# Patient Record
Sex: Female | Born: 1955 | Race: White | Hispanic: No | Smoking: Current some day smoker
Health system: Southern US, Community
[De-identification: ages and names within clinical notes are randomized; demographics above are authoritative.]

## PROBLEM LIST (undated history)

## (undated) DIAGNOSIS — Z974 Presence of external hearing-aid: Secondary | ICD-10-CM

## (undated) DIAGNOSIS — J3089 Other allergic rhinitis: Secondary | ICD-10-CM

## (undated) DIAGNOSIS — G4733 Obstructive sleep apnea (adult) (pediatric): Secondary | ICD-10-CM

## (undated) DIAGNOSIS — F419 Anxiety disorder, unspecified: Secondary | ICD-10-CM

## (undated) DIAGNOSIS — Z973 Presence of spectacles and contact lenses: Secondary | ICD-10-CM

## (undated) DIAGNOSIS — E785 Hyperlipidemia, unspecified: Secondary | ICD-10-CM

## (undated) DIAGNOSIS — M1990 Unspecified osteoarthritis, unspecified site: Secondary | ICD-10-CM

## (undated) DIAGNOSIS — H409 Unspecified glaucoma: Secondary | ICD-10-CM

## (undated) DIAGNOSIS — H9190 Unspecified hearing loss, unspecified ear: Secondary | ICD-10-CM

## (undated) DIAGNOSIS — G3184 Mild cognitive impairment, so stated: Principal | ICD-10-CM

## (undated) DIAGNOSIS — R29898 Other symptoms and signs involving the musculoskeletal system: Secondary | ICD-10-CM

## (undated) DIAGNOSIS — F329 Major depressive disorder, single episode, unspecified: Secondary | ICD-10-CM

## (undated) HISTORY — PX: TOTAL KNEE ARTHROPLASTY: SHX125

## (undated) HISTORY — PX: ABDOMINAL HYSTERECTOMY: SHX81

## (undated) HISTORY — DX: Mild cognitive impairment, so stated: G31.84

## (undated) HISTORY — PX: LAPAROSCOPIC VAGINAL HYSTERECTOMY WITH SALPINGO OOPHORECTOMY: SHX6681

## (undated) HISTORY — DX: Unspecified hearing loss, unspecified ear: H91.90

## (undated) HISTORY — PX: FRACTURE SURGERY: SHX138

## (undated) HISTORY — DX: Anxiety disorder, unspecified: F41.9

## (undated) HISTORY — PX: KNEE ARTHROSCOPY W/ SYNOVECTOMY: SHX1887

## (undated) HISTORY — PX: ABDOMINAL HERNIA REPAIR: SHX539

## (undated) HISTORY — PX: JOINT REPLACEMENT: SHX530

## (undated) HISTORY — PX: OTHER SURGICAL HISTORY: SHX169

---

## 1997-08-07 ENCOUNTER — Encounter (INDEPENDENT_AMBULATORY_CARE_PROVIDER_SITE_OTHER): Payer: Self-pay | Admitting: *Deleted

## 1997-08-07 LAB — CONVERTED CEMR LAB

## 1999-08-17 ENCOUNTER — Other Ambulatory Visit: Admission: RE | Admit: 1999-08-17 | Discharge: 1999-08-17 | Payer: Self-pay | Admitting: Family Medicine

## 2000-08-27 ENCOUNTER — Emergency Department (HOSPITAL_COMMUNITY): Admission: EM | Admit: 2000-08-27 | Discharge: 2000-08-27 | Payer: Self-pay | Admitting: Emergency Medicine

## 2001-01-30 ENCOUNTER — Emergency Department (HOSPITAL_COMMUNITY): Admission: EM | Admit: 2001-01-30 | Discharge: 2001-01-30 | Payer: Self-pay | Admitting: Emergency Medicine

## 2002-07-08 ENCOUNTER — Encounter: Admission: RE | Admit: 2002-07-08 | Discharge: 2002-07-08 | Payer: Self-pay | Admitting: Family Medicine

## 2002-07-08 ENCOUNTER — Other Ambulatory Visit: Admission: RE | Admit: 2002-07-08 | Discharge: 2002-07-08 | Payer: Self-pay | Admitting: *Deleted

## 2002-07-08 ENCOUNTER — Encounter (INDEPENDENT_AMBULATORY_CARE_PROVIDER_SITE_OTHER): Payer: Self-pay | Admitting: *Deleted

## 2002-07-23 ENCOUNTER — Encounter: Payer: Self-pay | Admitting: Sports Medicine

## 2002-07-23 ENCOUNTER — Encounter: Admission: RE | Admit: 2002-07-23 | Discharge: 2002-07-23 | Payer: Self-pay | Admitting: Sports Medicine

## 2002-07-25 ENCOUNTER — Encounter: Admission: RE | Admit: 2002-07-25 | Discharge: 2002-07-25 | Payer: Self-pay | Admitting: Family Medicine

## 2002-08-26 ENCOUNTER — Encounter: Admission: RE | Admit: 2002-08-26 | Discharge: 2002-08-26 | Payer: Self-pay | Admitting: Family Medicine

## 2005-09-25 ENCOUNTER — Observation Stay (HOSPITAL_COMMUNITY): Admission: AD | Admit: 2005-09-25 | Discharge: 2005-09-26 | Payer: Self-pay | Admitting: Obstetrics and Gynecology

## 2005-09-25 ENCOUNTER — Encounter (INDEPENDENT_AMBULATORY_CARE_PROVIDER_SITE_OTHER): Payer: Self-pay | Admitting: *Deleted

## 2005-10-24 ENCOUNTER — Encounter: Admission: RE | Admit: 2005-10-24 | Discharge: 2006-01-22 | Payer: Self-pay | Admitting: Cardiology

## 2006-06-11 ENCOUNTER — Inpatient Hospital Stay (HOSPITAL_COMMUNITY): Admission: RE | Admit: 2006-06-11 | Discharge: 2006-06-14 | Payer: Self-pay | Admitting: Specialist

## 2006-06-12 ENCOUNTER — Ambulatory Visit: Payer: Self-pay | Admitting: Physical Medicine & Rehabilitation

## 2006-06-14 ENCOUNTER — Inpatient Hospital Stay (HOSPITAL_COMMUNITY)
Admission: RE | Admit: 2006-06-14 | Discharge: 2006-06-21 | Payer: Self-pay | Admitting: Physical Medicine & Rehabilitation

## 2006-06-19 ENCOUNTER — Encounter: Payer: Self-pay | Admitting: Vascular Surgery

## 2006-10-04 DIAGNOSIS — IMO0002 Reserved for concepts with insufficient information to code with codable children: Secondary | ICD-10-CM | POA: Insufficient documentation

## 2006-10-04 DIAGNOSIS — F411 Generalized anxiety disorder: Secondary | ICD-10-CM | POA: Insufficient documentation

## 2006-10-04 DIAGNOSIS — F329 Major depressive disorder, single episode, unspecified: Secondary | ICD-10-CM | POA: Insufficient documentation

## 2006-10-04 DIAGNOSIS — J329 Chronic sinusitis, unspecified: Secondary | ICD-10-CM | POA: Insufficient documentation

## 2006-10-04 DIAGNOSIS — M1710 Unilateral primary osteoarthritis, unspecified knee: Secondary | ICD-10-CM

## 2006-10-05 ENCOUNTER — Encounter (INDEPENDENT_AMBULATORY_CARE_PROVIDER_SITE_OTHER): Payer: Self-pay | Admitting: *Deleted

## 2008-01-14 ENCOUNTER — Ambulatory Visit: Payer: Self-pay | Admitting: Gastroenterology

## 2008-01-20 ENCOUNTER — Ambulatory Visit: Payer: Self-pay | Admitting: Gastroenterology

## 2008-12-21 ENCOUNTER — Ambulatory Visit (HOSPITAL_COMMUNITY): Admission: RE | Admit: 2008-12-21 | Discharge: 2008-12-22 | Payer: Self-pay | Admitting: Orthopedic Surgery

## 2009-05-31 ENCOUNTER — Ambulatory Visit: Payer: Self-pay | Admitting: Psychiatry

## 2009-05-31 ENCOUNTER — Inpatient Hospital Stay (HOSPITAL_COMMUNITY): Admission: AD | Admit: 2009-05-31 | Discharge: 2009-06-03 | Payer: Self-pay | Admitting: Psychiatry

## 2009-06-09 ENCOUNTER — Ambulatory Visit: Payer: Self-pay | Admitting: Psychiatry

## 2009-06-09 ENCOUNTER — Other Ambulatory Visit (HOSPITAL_COMMUNITY): Admission: RE | Admit: 2009-06-09 | Discharge: 2009-06-29 | Payer: Self-pay | Admitting: Psychiatry

## 2009-07-05 ENCOUNTER — Ambulatory Visit: Payer: Self-pay | Admitting: Radiology

## 2009-07-05 ENCOUNTER — Emergency Department (HOSPITAL_BASED_OUTPATIENT_CLINIC_OR_DEPARTMENT_OTHER): Admission: EM | Admit: 2009-07-05 | Discharge: 2009-07-05 | Payer: Self-pay | Admitting: Emergency Medicine

## 2010-07-20 ENCOUNTER — Ambulatory Visit
Admission: RE | Admit: 2010-07-20 | Discharge: 2010-07-20 | Payer: Self-pay | Source: Home / Self Care | Attending: Orthopedic Surgery | Admitting: Orthopedic Surgery

## 2010-10-17 LAB — POCT HEMOGLOBIN-HEMACUE: Hemoglobin: 13.9 g/dL (ref 12.0–15.0)

## 2010-11-10 LAB — DIFFERENTIAL
Basophils Absolute: 0 10*3/uL (ref 0.0–0.1)
Basophils Relative: 0 % (ref 0–1)
Eosinophils Absolute: 0.1 10*3/uL (ref 0.0–0.7)
Monocytes Absolute: 0.7 10*3/uL (ref 0.1–1.0)
Monocytes Relative: 9 % (ref 3–12)
Neutro Abs: 4.4 10*3/uL (ref 1.7–7.7)
Neutrophils Relative %: 54 % (ref 43–77)

## 2010-11-10 LAB — COMPREHENSIVE METABOLIC PANEL
ALT: 25 U/L (ref 0–35)
Albumin: 3.6 g/dL (ref 3.5–5.2)
Alkaline Phosphatase: 61 U/L (ref 39–117)
BUN: 14 mg/dL (ref 6–23)
Chloride: 103 mEq/L (ref 96–112)
Glucose, Bld: 105 mg/dL — ABNORMAL HIGH (ref 70–99)
Potassium: 4.4 mEq/L (ref 3.5–5.1)
Sodium: 138 mEq/L (ref 135–145)
Total Bilirubin: 0.3 mg/dL (ref 0.3–1.2)
Total Protein: 7.1 g/dL (ref 6.0–8.3)

## 2010-11-10 LAB — DRUGS OF ABUSE SCREEN W/O ALC, ROUTINE URINE
Amphetamine Screen, Ur: NEGATIVE
Creatinine,U: 34.8 mg/dL
Marijuana Metabolite: NEGATIVE
Opiate Screen, Urine: NEGATIVE
Propoxyphene: NEGATIVE

## 2010-11-10 LAB — CBC
HCT: 36.6 % (ref 36.0–46.0)
Hemoglobin: 12.6 g/dL (ref 12.0–15.0)
RDW: 12.2 % (ref 11.5–15.5)
WBC: 8.1 10*3/uL (ref 4.0–10.5)

## 2010-12-20 NOTE — Op Note (Signed)
NAME:  [name] [name], [name] [name]                 ACCOUNT NO.:  [id number]   MEDICAL RECORD NO.:  [id number]          PATIENT TYPE:  AMB   LOCATION:  DAY                          FACILITY:  [facility]   PHYSICIAN:  Marlowe Kays, M.D.  DATE OF BIRTH:  [birth year]   DATE OF PROCEDURE:  12/21/2008  DATE OF DISCHARGE:                               OPERATIVE REPORT   PREOPERATIVE DIAGNOSIS:  Torn peroneus brevis tendon, right ankle.   POSTOPERATIVE DIAGNOSIS:  Torn peroneus brevis tendon, right ankle.   OPERATION:  Repair of torn peroneus brevis tendon, right ankle.   SURGEON:  Marlowe Kays, M.D.   ASSISTANT:  Nurse.   ANESTHESIA:  General.   PATHOLOGY AND JUSTIFICATION FOR PROCEDURE:  She injured her right ankle  some 8 months ago and because of persistent pain, particularly of the  anterolateral ankle tenderness there, I obtained an MRI which was  performed Dec 05, 2008 which showed a number of abnormalities, but the  dominant finding would appear to be a tear of the peroneus brevis tendon  at the retromalleolar and inframalleolar area laterally.  I reviewed the  films with her and it did indeed look like a tear at this location.  These findings were confirmed at surgery as discussed below.   PROCEDURE:  Prophylactic antibiotics.  She has had knee replacements.  Pneumatic tourniquet with the right leg Esmarched out nonsterilely and  tourniquet inflated to 300 mmHg.  Right leg from mid calf to toes  prepped with DuraPrep and draped in a sterile field.  I made a curved  incision starting initially in the retro and infralateral malleolar  area, working proximally and distally as the need was indicated during  the case.  I opened the peroneal tendon sheath posterior to the lateral  malleolus and located normal tendons and then followed them distally.  The tear was at first not obvious, but on inspection the tear was about  2.5 to 3 cm, and it was a partial tear on the inner side of the peroneus  brevis tendon in the inframalleolar area.  Whether or not this was a  segmented tendon or not proximal and distal to this I could not tell,  but there was no definite tear but there was a definite delineation in  the tendon.  Accordingly, I first repaired the obvious tear with running  3-0 Ethibond including some interrupteds in certain areas.  I then  repaired the possible interval abnormalities, both proximal and distal,  with the same.  This seemed to give a nice stable repair.  The extensor  brevis tendon was then repositioned in its anatomic location, the wound  irrigated with sterile saline and soft tissues infiltrated with 0.25%  plain Marcaine.  I then closed the peroneal retinaculum with interrupted  3-0 Vicryl, subcutaneous tissue with the same, and the skin with running  4-0 nylon.  Betadine, Adaptic and dry sterile dressing were placed followed by a  short-leg splint cast.  Tourniquet was released at 1 hour of tourniquet  time.  She tolerated the procedure well and was taken to the recovery  room in satisfactory condition with no known complications.           ______________________________  Marlowe Kays, M.D.     JA/MEDQ  D:  12/21/2008  T:  12/21/2008  Job:  [id number]

## 2010-12-23 NOTE — Discharge Summary (Signed)
NAME:  [name] [name], [name] [name]                 ACCOUNT NO.:  [id number]   MEDICAL RECORD NO.:  [id number]          PATIENT TYPE:  IPS   LOCATION:  4031                         FACILITY:  [facility]   PHYSICIAN:  Ranelle Oyster, M.D.DATE OF BIRTH:  05/08/1956   DATE OF ADMISSION:  06/14/2006  DATE OF DISCHARGE:  06/21/2006                                 DISCHARGE SUMMARY   DISCHARGE DIAGNOSES:  1. Bilateral knee osteoarthritis requiring total knee replacements.  2. Obstructive sleep apnea.  3. Obesity.  4. Acute blood loss anemia.  5. Postoperative fever, resolved.   HISTORY OF PRESENT ILLNESS:  [name] [name] is a 55 year old female with  history of sleep apnea and bilateral knee pain secondary to end-stage OA.  The patient elected to undergo bilateral total knee replacement on June 11, 2006, by Dr. Sherlean Foot.  Postop is weightbearing as tolerated.  CPM being  used for passive range of motion and Coumadin for DVT prophylaxis.  She has  had issues of acute blood loss anemia with the last H&H of 8.7 and 24.6.  Also has had issues with mild hyponatremia with the most recent sodium at  132.  The patient has been undergoing PT and OT and noted to have elevated  levels of anxiety with some issues with pain control.  Rehab consult for  further therapies.   PAST MEDICAL HISTORY:  Significant for:  1. Sleep apnea.  2. 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  3. Dyslipidemia.  4. Depression.  5. Anxiety.  6. Hysterectomy.   ALLERGIES:  NO KNOWN DRUG ALLERGIES.   FAMILY HISTORY:  Positive for OA and depression.   SOCIAL HISTORY:  The patient is independent and working for [employer].  Has not used any tobacco for 10 years.  Uses 2-3 glasses of  wine a week.  The patient was independent in driving prior to admission.   HOSPITAL COURSE:  Ms. [name] [name] was admitted to rehab on June 14, 2006, for inpatient therapies to consist of PT and OT daily.  Past  admission  subcutaneous Lovenox was added subtherapeutic at 1.2 and this was continued  until Coumadin on therapeutic basis.  Iron supplements were continued for  acute blood loss anemia, recheck H&H has been done throughout the stay.  Prior to discharge, this was noted to be overall stable at 8.7 and 25.6.  Check on electrolytes for followup of mild hyponatremia shows this too has  resolved with sodium at 138, potassium 3.4, chloride 102, CO2 27, BUN 5,  creatinine 0.6, glucose 125, albumin was at 2.7.  The patient was noted to  have mild irritation bilateral incisions with minimal edema bilateral knees.  She was noted to have an area of well demarcated square area of rash on  lower back secondary to tape dermatitis that was treated with use of steroid  cream and p.r.n. Benadryl.  Pain control was initially an issue.  OxyContin  CR was added and titrated upwards to 30 mg q.12 hours with adjustment of  muscle relaxers, Flexeril 10 mg p.o. q.8 hours, as  well as icing of  bilateral incisions for pain management.  Senokot-S was adjusted to 2 p.o.  b.i.d. to help with constipation issues.  The patient's anxiety level  overall was noted to be much improved during her stay.  She made good  progress.  The patient was at modified independent level for transfers,  modified independence for ambulating 90 feet x4, required supervision to  navigate stairs.  She shows improvement in standing balance_ .  Able to  perform community activities and gait with a rolling walker for greater than  300 feet.  Was at modified independent level for ADLs and home management  tasks.  Further followup therapies to include home health PT/OT by [facility].  Home health RN has been arranged for prothrombin time.  [facility] to follow Coumadin through July 11, 2006.  On June 21, 2006, the patient is discharged to home.   DISCHARGE MEDICATIONS:  1. Coumadin 5 mg p.o. q.p.m.  2. OxyContin CR 30  mg q.12 hours x1 week, to be tapered over the next 3      weeks.  3. Ferrous sulfate 325 mg b.i.d.  4. Zetia 10 mg nightly.  5. Effexor XR 150 mg nightly.  6. Senokot-S 2 p.o. b.i.d.  7. OxyIR 5-10 mg q.4-6 hours p.r.n. pain.  8. Flexeril 10 mg q.8 hours p.r.n. spasms.   Diet is regular.  Wound care:  Wash with antibacterial soap and water, keep  clean and dry.  Activity level is as tolerated and independent level with  use of walker.  No strenuous activity.  No driving.  No smoking.  No  alcohol.   FOLLOWUP:  The patient to follow up with Dr. Lequita Halt in 7-10 days.  Followup  with Dr. Kevin Fenton for routine check.  Followup with Dr. Riley Kill as needed.      Greg Cutter, P.A.      Ranelle Oyster, M.D.  Electronically Signed    PP/MEDQ  D:  06/26/2006  T:  06/27/2006  Job:  [id number]   cc:   Mila Homer. Sherlean Foot, M.D.  Faylene Million Katrinka Blazing, M.D.

## 2010-12-23 NOTE — Discharge Summary (Signed)
NAME:  [name] [name], [name] [name]                 ACCOUNT NO.:  [id number]   MEDICAL RECORD NO.:  [id number]          PATIENT TYPE:  IPS   LOCATION:  4031                         FACILITY:  [facility]   PHYSICIAN:  Ranelle Oyster, M.D.DATE OF BIRTH:  07-Nov-1955   DATE OF ADMISSION:  06/14/2006  DATE OF DISCHARGE:  06/21/2006                                 DISCHARGE SUMMARY   DISCHARGE DIAGNOSES:  1. Bilateral knee osteoarthritis requiring bilateral total knee      replacement.  2. Postop anemia.  3. Obstructive sleep apnea.  4. Urine loss.  5. Postoperative fevers resolved.   HISTORY OF PRESENT ILLNESS:  Mr. Baine is a 55 year old female with history  of sleep apnea, bilateral knee pain secondary to end-stage OA, requiring  bilateral total knee replacement June 11, 2006, by Dr. Lequita Halt. Postop is  weightbearing as tolerated with CPM being used for passive range of motion  and Coumadin for DVT prophylaxis.  INR subtherapeutic at 1.2 today.  The  patient has had issues with acute blood loss anemia with H&H at 8.7, 24.6  and mild hypernatremia secondary to IV fluids with sodium last at 132.  PT,  OT ongoing and patient noted to have elevated anxiety levels as well as  issues with pain management, has been progressing along with therapy.  Rehab  consulted for progressive therapies.   PAST MEDICAL HISTORY:  Significant for sleep apnea, decrease in hearing  secondary to frequent infections as a child, requiring use of hearing AIDS,  dyslipidemia, depression, anxiety, hysterectomy.   ALLERGIES:  NO KNOWN DRUG ALLERGIES.   FAMILY HISTORY:  Is positive for OA and depression.   SOCIAL HISTORY:  The patient is independent and working  prior to admission.  Has not used any tobacco in 10 years. Uses 2-3 glasses of wine a week.   HOSPITAL COURSE:  Ms. [name] [name] was admitted to rehab on June 14, 2006,  for inpatient therapies to consist of PT, OT daily.  Past admission subcu  Lovenox was added  and INR subtherapeutic 1.2.  Senna S was added  for complaints of constipation.  The patient with contact dermatitis  secondary to tape that was used on her lower back.  This was initially  treated with Benadryl p.r.n. and hydrocortisone cream was added to help with  symptomatology.      Greg Cutter, P.A.      Ranelle Oyster, M.D.  Electronically Signed    PP/MEDQ  D:  06/26/2006  T:  06/26/2006  Job:  [id number]   cc:   Nicholos Johns A. Katrinka Blazing, M.D.  Ollen Gross, M.D.

## 2010-12-23 NOTE — Op Note (Signed)
NAME:  [name] [name], [name] [name]                ACCOUNT NO.:  [id number]   MEDICAL RECORD NO.:  [id number]          PATIENT TYPE:  AMB   LOCATION:  SDC                           FACILITY:  [facility]   PHYSICIAN:  Zelphia Cairo, MD    DATE OF BIRTH:  1955/11/18   DATE OF PROCEDURE:  09/25/2005  DATE OF DISCHARGE:                                 OPERATIVE REPORT   PREOPERATIVE DIAGNOSIS:  Menorrhagia, uterine fibroid.   POSTOPERATIVE DIAGNOSIS:  Menorrhagia, uterine fibroid.   OPERATION/PROCEDURE:  Laparoscopic-assisted vaginal hysterectomy.  Bilateral salpingo-oophorectomy.   SURGEON:  Zelphia Cairo, M.D.   ASSISTANT:  Artist Pais, M.D.   ANESTHESIA:  General.   SPECIMENS:  Uterine, cervix, bilateral tubes and ovaries.   COMPLICATIONS:  None.   ESTIMATED BLOOD LOSS:  100 mL.   CONDITION:  Stable to recovery room.   DESCRIPTION OF PROCEDURE:  The patient was taken to the operating room and  general anesthesia was obtained.  She was placed in the dorsal lithotomy  position using Allen stirrups.  She was prepped and draped in the sterile  fashion.  A Graves speculum was placed in the vagina.  A single-tooth  tenaculum attached to the anterior cervix.  The uterine manipulator was  placed on the cervix.  The tenaculum and speculum were removed from the  vagina.  Attention was then turned to the abdomen.   A small infraumbilical skin incision was made with the scalpel.  Veress  needle was placed intraperitoneally and the pelvis and abdomen were  insufflated with CO2 gas.  The Veress needle was then removed and the 11 mm  trocar was inserted without difficulty.  The laparoscope was than placed and  the pelvis and abdomen were noted to be free of injury from entry.  The  uterus was then lifted upwards and noted to be free of adhesions.  The  uterus was then dropped.  A small suprapubic skin incision was made with the  scalpel and a 5 mm trocar was placed under direct visualization.  A  blunt  probe was then placed in this port.  The patient was placed in Trendelenburg  position and the bowel was swept out of the cul-de-sac.  The uterus was  tented upward and to the left.  A blunt probe was removed and a blunt  grasper was used to grasp the right adnexa.  The Gyrus was then used to  cauterize the infundibulopelvic ligament.  The Gyrus was then used to  cauterize and transect down the mesosalpinx to the level of the round  ligament.  The round ligament was cauterized and transected using the Gyrus.  Care was taken throughout this part of the procedure to insure that we were  a safe distance from the ureter.  Our attention was then turned to the left  side.  The uterus was tented upwards and to the right and the left adnexa  was grasped and tented upwards with the blunt grasper.  The bowel was moved  out of the way and the Gyrus again was used to cauterize and transect the  infundibulopelvic ligament down to the level of the round ligament.  Again  care was taken not to injure the ureter.  Excellent hemostasis was noted  bilaterally.  Gas was released from the abdomen.  The camera and instruments  were removed from the abdomen and pelvis and our attention was then turned  to the vagina.  A weighted speculum was placed in the posterior vagina and a  Deaver was placed anteriorly.  The scalpel was used to make a  circumferential incision around the cervix.  The posterior cul-de-sac was  entered without difficulty and the weighted speculum was placed  intraperitoneally.  Anteriorly the bladder was dissected off the anterior  cervix and the uterosacral ligaments were cauterized and transected using  the Gyrus.  Next, the anterior cul-de-sac was entered sharply using the Mayo  scissors.  The Deaver was placed to protect the bladder.  The cardinal  ligament and uterine artery were then cauterized and transected using the  Gyrus.  A tenaculum was used to grab the fundus of the  uterus and deliver it  posteriorly through the incision.  A curved Heaney clamp was used to clamp  the remaining broad ligament.  This was transected with Mayo scissors and  suture ligated.  The uterus was then handed off and sent to pathology.   Next, a wet sponge was placed to retract the bowel.  The posterior vaginal  cuff was closed with Vicryl in a running locked fashion.  Excellent  hemostasis was noted.  The anterior cuff was closed with figure-of-eight  stitches.  The vagina was then packed with packing and Estrace cream.  Foley  catheter was then inserted.  We changed into sterile gloves and went back up  to the abdomen.  The camera was reinserted and the abdomen and pelvis were  reinsufflated with CO2.  All pedicles were noted to be free of bleeding.  The pelvis was irrigated and excellent hemostasis was noted.  All trocars  and instruments were removed.  The fascia of the infraumbilical skin  incision was closed and the skin was closed with 3-0 Vicryl.  Steri-Strips  and benzoin were placed.  The patient tolerated the procedure well and she  was taken to the recovery room in stable condition.      Zelphia Cairo, MD  Electronically Signed     GA/MEDQ  D:  09/25/2005  T:  09/25/2005  Job:  [id number]

## 2010-12-23 NOTE — Op Note (Signed)
NAME:  [name], [name]                 ACCOUNT NO.:  [id number]   MEDICAL RECORD NO.:  [id number]          PATIENT TYPE:  INP   LOCATION:  0006                         FACILITY:  [facility]   PHYSICIAN:  Ollen Gross, M.D.    DATE OF BIRTH:  03-27-1956   DATE OF PROCEDURE:  06/11/2006  DATE OF DISCHARGE:                                 OPERATIVE REPORT   PREOPERATIVE DIAGNOSIS:  Osteoarthritis bilateral knees.   POSTOPERATIVE DIAGNOSIS:  Osteoarthritis bilateral knees.   PROCEDURE:  Bilateral total knee arthroplasty.   SURGEON:  Ollen Gross, M.D.   ASSISTANT:  Avel Peace.   ANESTHESIA:  General with postop epidural catheter.   ESTIMATED BLOOD LOSS:  Minimal.   DRAINS:  Hemovac times one each side.   TOURNIQUET TIME:  38 minutes on the left at 300 mmHg, 35 minutes on the  right at 300 mmHg.   COMPLICATIONS:  None.   CONDITION:  Stable to recovery.   BRIEF CLINICAL NOTE:  [name] [name] is a 55 year old female with end-stage  osteoarthritis both knees with intractable pain.  She has failed  nonoperative management,  including injection.  She presents for total knee  arthroplasty bilaterally.   PROCEDURE IN DETAIL:  At the successful initiation of epidural catheter and  then general anesthetic, tourniquets placed high on both thighs and both  lower extremities prepped and draped in usual sterile fashion.  The left had  been hurting longer, thus we did opted to do the left side first.  Left  lower extremities wrapped in Esmarch, knee flexed and tourniquet inflated  300 mmHg.  Midline incision made with a 10 blade through subcutaneous tissue  to the level of the extensor mechanism.  A fresh blade is used to make a  medial parapatellar arthrotomy and the soft tissue over the proximal medial  tibia subperiosteally elevated to the joint line with a knife and into the  semimembranosus bursa with a Cobb elevator.  Soft tissue laterally is  elevated attention being paid to avoid patellar  tendon on tibial tubercle.  The patella subluxed laterally, knee flexed 90 degrees and ACL and PCL  removed.  Drill was used to create a starting hole in the distal femur and  canal was thoroughly irrigated.  5 degrees left valgus alignment guide is  placed and referencing off the posterior condyles, rotations marked and the  block pinned to remove 11 mm of distal femur.  Took 11 off because of  flexion contracture.  Distal femoral resection is made with the oscillating  saw.  Sizing blocks placed, size 3 is most appropriate.  Rotations marked  off the epicondylar axis.  Size 3 cutting blocks placed and the anterior-  posterior chamfer cuts were made.   Tibia subluxed forward, menisci are removed.  Extramedullary tibial  alignment guide is placed referencing proximally at the medial aspect of the  tibial tubercle and distally along the second metatarsal axis and tibial  crest.  Blocks pinned to remove 10 mm of the non deficient lateral side.  Tibial resection is made with an oscillating saw.  Size 2.5 is  most  appropriate tibial component and the proximal tibia prepared with modular  drill and keel punch for a size 2.5.  Femoral preparation is completed with  the intercondylar cut for the size 3.   Size 2.5 mobile bearing tibial tray with a size 3 posterior stabilized  femoral trial and a 10 mm posterior stabilized rotating platform insert  trial were placed.  With a 10 full extension was achieved with excellent  varus and valgus balance throughout full range of motion.  Patella was then  everted and thickness measured to be 22 mm.  Freehand resection taken to 12  mm, 38 template is placed, lug holes were drilled, trial patella was placed  and tracks normally.  Osteophytes were then removed off the posterior femur  with the trial in place.  All trials were removed and the cut bone surfaces  were prepared with pulsatile lavage.  Cement was mixed and once ready for  implantation the size  2.5 mobile bearing tibial tray, size 3 posterior  stabilized femur and 38 patella are cemented into place.  Patella was held  with a clamp.  Trial 10-mm inserts placed.  Knee held in full extension and  all extruded cement removed.  Once cement fully hardened then the permanent  10 mm posterior stabilized rotating platform insert is placed into the  tibial tray.  Wound was copiously irrigated with saline solution and  extensor mechanism closed over Hemovac drain with interrupted #1 PDS.  Flexion against gravity was 135 degrees.  Tourniquet released total time of  38 minutes.  Subcu is then closed with interrupted 2-0 Vicryl.  Moist sponge  is placed and the knee is wrapped while the other knee was addressed.   Right lower extremities wrapped in Esmarch, knee flexed, tourniquet inflated  300 mmHg.  Same midline approach was made as on the other side.  Medial  parapatellar arthrotomies made.  Soft tissue over the proximal medial tibia  subperiosteally elevated at joint line with a knife and the semimembranosus  bursa with a Cobb elevator.  Soft tissue laterally is elevated, attention  being paid to avoid patellar tendon on tibial tubercle.  Knee flexed 90  degrees, patella subluxed laterally, ACL and PCL removed.  Drill was used to  create a starting hole distal femur.  5 degrees right valgus alignment guide  is placed.  Took 11 mL off the distal femur because of flexion contracture.  Size 3 is the most appropriate femoral component and rotation is marked at  the epicondylar axis.  Anterior, posterior and chamfer cuts were then made  with a size 3 cutting block.  Tibia subluxed forward.  Extramedullary guide  is placed using the same reference marks as the other side.  We removed 10  mm of the non deficient lateral side.  Size 2.5 is the most appropriate  tibial component and the proximal tibia was prepared with the modular drill and keel punch for the 2.5.  Femoral preparation is completed  the  intercondylar cut for the size 3.   Size 2.5 mobile bearing tibial trial, size 3 posterior stabilized femoral  trial and a 10 mm posterior stabilized rotating platform insert trial were  placed.  With a 10 there is a tiny bit of laxity in hyperextension and we  went to a 12.5 which allowed for full extension with excellent varus and  valgus balance throughout full range of motion.  Patella was everted and  with the freehand technique, we prepared for a  38.  Pre resection is 22 mm,  post resection 12 mm.  The osteophytes removed off the posterior femur with  the trial placed.  All trials removed.  Cut bone surfaces prepared with  pulsatile lavage.  Cement was mixed and components are cemented into place.  Once cement was fully hardened, then the permanent 12.5 mm posterior  stabilized rotating platform insert is placed.  Wound was copiously  irrigated with saline solution and extensor mechanism closed over Hemovac  drain with interrupted #1 PDS.  Flexion against gravity to 135 degrees.  Tourniquet released total time 35 minutes.  Closure was completed as  the opposite side.  Subcuticular layers then closed with running 4-0  Monocryl.  Drains hooked to suction.  Incisions cleaned and dried and Steri-  Strips and bulky sterile dressing applied.  She is then placed into knee  immobilizer, is awakened and transferred to recovery in stable condition.      Ollen Gross, M.D.  Electronically Signed     FA/MEDQ  D:  06/11/2006  T:  06/11/2006  Job:  [id number]

## 2010-12-23 NOTE — Discharge Summary (Signed)
NAME:  [name] [name], [name] [name]                 ACCOUNT NO.:  [id number]   MEDICAL RECORD NO.:  [id number]          PATIENT TYPE:  INP   LOCATION:  1518                         FACILITY:  [facility]   PHYSICIAN:  Ollen Gross, M.D.    DATE OF BIRTH:  06-Dec-1955   DATE OF ADMISSION:  06/11/2006  DATE OF DISCHARGE:  06/14/2006                                 DISCHARGE SUMMARY   TENTATIVE DATE OF DISCHARGE:  June 14, 2006 (pending insurance  clearance).   ADMITTING DIAGNOSES:  1. End-stage arthritis, bilateral knees.  2. Hypercholesterolemia.  3. Sleep apnea.  4. Hearing deficits; she uses bilateral hearing aids.   DISCHARGE DIAGNOSES:  1. Osteoarthritis, bilateral knees, status post bilateral total knee      replacement arthroplasty.  2. Postoperative acute blood loss anemia.  3. Postoperative hyponatremia.  4. End-stage arthritis, bilateral knees.  5. Hypercholesterolemia.  6. Sleep apnea.  7. Hearing deficits; she uses bilateral hearing aids.   PROCEDURE:  June 11, 2006, bilateral total knee replacement arthroplasty.   SURGEON:  Dr. Homero Fellers Aluisio.   ASSISTANT:  Drew L. Perkins, P.A.-C.   BLOOD LOSS:  Minimal blood loss.   ANESTHESIA:  General with postoperative epidural catheter.   TOURNIQUET TIME:  Thirty-eight minutes on the left, 35 minutes on the right.   CONSULTS:  Rehab Services.   BRIEF HISTORY:  [name] [name] is a 55 year old female with end-stage osteoarthritis  of both knees, intractable pain, failed nonoperative management including  injections, who now presents for bilateral total knees.   LABORATORY DATA:  Preop CBC showed a hemoglobin of 13.1, hematocrit of 38.8,  white cell count of 5.2; postop hemoglobin 10.9 and drifted down to 9.1;  last noted H&H were 8.7 and 24.6.  Followup H&H pending tomorrow.  PT and  PTT on admission 12.3 and 30, respectively.  INR 0.9.  Serial pro times were  followed, last noted PT and INR were 15.3 and 1.2, currently on Lovenox  bridging  until therapeutic.  Chemistry panel on admission:  Slightly  elevated CO2 of 33, remaining chemistry panel all within normal limits.  Serial BMETs were followed; sodium did drop from 144 to 134, lasted noted at  133, followup BMET pending.  Urinalysis negative.  Blood type A positive.   Chest x-ray, June 05, 2006:  No acute cardiopulmonary disease.   EKG:  EKG dated May 28, 2006:  Normal sinus rhythm, rate 68, flat T  waves in V1, stable from November 2006.   HOSPITAL COURSE:  The patient was admitted to [facility] and  tolerated procedure well, later transferred to recovery room and the  orthopedic floors, placed an epidural catheter for pain control  postoperatively, just had analgesic in it.  Anesthesia allowed [name] to use PCA  and p.o. analgesics for supplementation and she actually did pretty well  through the night.  We got a rehab consult; the patient was seen by [facility], who felt she would be an appropriate candidate for inpatient  rehab.  One of the Hemovac drains on the left came out; we pulled the right  on day #1 and actually had very good output out of her Foley.  She did have  some low sodium; fluids were switched and started back on her home  medications, got up out of bed to the chair, a few steps on day #1.  By day  #2, she was doing a little bit better.  Did have some moderate pain after  being in the CPMs and epidurals were adjusted for better control.  Epidural  was pulled by Anesthesia on day #2.  She started on Lovenox about 4 hours  after the epidural was removed.  She was on Coumadin and started that the  night before.  Dressings were changed; both incisions looked excellent, very  minimal swelling.  Hemoglobin was down a little bit, but she was  asymptomatic.  We placed her on iron.  She did have a drop in her sodium to  133.  Foley was removed that afternoon.  On the morning of day #3, she was  doing better.  Sodium was at 132; fluids were  discontinued and I felt that  was going to titrate back up and we were going to recheck the next day.  Hemoglobin was 8.7; she was symptomatic with this, started to get up with  Therapy, walking short distances, felt to be a good candidate.  They were  hopeful for a bed on Rehab.  They were checking insurance; if that is  cleared, then she will be transferred to Rehab.   DISCHARGE PLAN:  1. Tentative transfer to Rehab on June 14, 2006 pending insurance      clearance.  2. Discharge diagnoses:  Please see above.  3. Discharge medications/current medications include:      a.     Coumadin protocol -- titrate the INR between 2.0 and 3.0.      b.     She is also on Lovenox bridging until Coumadin therapeutic until       INR is 2.0 or greater, then can discontinue the Lovenox.      c.     Colace 100 mg p.o. b.i.d.      d.     Ferrous sulfate  325 mg p.o. t.i.d.      e.     Zetia 10 mg p.o. nightly.      f.     Effexor XR 150 mg p.o. nightly.      g.     Percocet 5 mg one or two every 4-6 hours as needed for pain.      h.     Tylenol one or two every 4-6 hours as needed for mild pain,       temperature or headache.      i.     Robaxin 500 mg p.o. q.6-8 h. p.r.n. spasm.      j.     Restoril 15 mg to 30 mg p.o. nightly p.r.n. sleep.      k.     Benadryl 25 mg p.o. q.6 h. p.r.n. itching.      l.     Zofran 4 mg p.o. q.4-6 h. p.r.n. nausea.  4. Diet as tolerated.  5. Activity:  She is weightbearing as tolerated to both lower extremities,      total knee protocol for bilateral total knee replacement      arthroplasties, continue the CPMs, but alternate sessions,      weightbearing as tolerated.  PT and OT for gait training ambulation and  ADLs.  6. Followup:  She needs to follow up in about 2 weeks after discharge from      Rehab.   DISPOSITION:  Pending, but plan for [facility].   CONDITION UPON DISCHARGE:  Improving.      Alexzandrew L. Julien Girt, P.A.      Ollen Gross, M.D.  Electronically Signed    ALP/MEDQ  D:  06/14/2006  T:  06/14/2006  Job:  [id number]   cc:   [facility] Jilda [facility]   [facility]

## 2010-12-23 NOTE — H&P (Signed)
NAME:  [name] [name], PHARO NO.:  [id number]   MEDICAL RECORD NO.:  [id number]          PATIENT TYPE:  INP   LOCATION:  0006                         FACILITY:  [facility]   PHYSICIAN:  Ollen Gross, M.D.    DATE OF BIRTH:  11/11/1955   DATE OF ADMISSION:  06/11/2006  DATE OF DISCHARGE:                                HISTORY & PHYSICAL   DATE OF ADMISSION:  Date of office visit and history and physical May 29, 2006.  Date of admission June 11, 2006.   CHIEF COMPLAINT:  Bilateral knee pain.   HISTORY OF PRESENT ILLNESS:  The patient is a 55 year old female who has  been seen in second opinion by Dr. Lequita Halt for ongoing knee problems.  The  right seems to be a little bit more symptomatic than the left but has  progressively gotten worse over the past several years.  She was seen in  second opinion and found to have significant bone on bone in both knees,  right is slightly worse than the left radiographically.  She has significant  pain and dysfunction.  It is felt she would benefit from undergoing knee  replacement.  She is felt to be a good candidate for knee replacement. She  would like to proceed with bilateral knee replacements.  Risks and benefits  of this procedure have been discussed at length and she elects to proceed  with surgery.   ALLERGIES:  No known drug allergies.   CURRENT MEDICATIONS:  1. Effexor.  2. Ibuprofen.  3. Vitamin B12.  4. Vitamin C.  5. Multivitamin.  6. Some Herbs.   PAST MEDICAL HISTORY:  1. Hypercholesterolemia.  2. Sleep apnea.  3. Partial deafness for which she uses bilateral hearing aids.  4. Depression.  5. Anxiety.   PAST SURGICAL HISTORY:  Right knee arthroscopy and hysterectomy.   FAMILY HISTORY:  Arthritis on her Mother's side, also a history of  depression.   SOCIAL HISTORY:  Social intake of alcohol, a couple of drinks a week, no  tobacco products.   REVIEW OF SYSTEMS:  GENERAL:  No fevers, chills, night  sweats.  NEUROLOGICAL:  No seizures, syncope or paralysis. RESPIRATORY:  No  productive cough, hemoptysis or shortness of breath.  CARDIOVASCULAR:  No  chest pain, angina or orthopnea. GASTROINTESTINAL:  No nausea, vomiting,  diarrhea, constipation.  GENITOURINARY:  No dysuria, hematuria, discharge.  MUSCULOSKELETAL:  Both knees.   PHYSICAL EXAMINATION:  VITAL SIGNS:  Pulse 68, respirations 12, blood  pressure 118/78.  GENERAL APPEARANCE: [age] white female, short stature, no acute  distress.  Alert, oriented and cooperative, accompanied by her significant  other.  HEENT:  Normocephalic, atraumatic, pupils equal, round, reactive.  Oropharynx clear. Extraocular movements intact.  CHEST:  Clear anterior and posterior chest walls.  No rales, rhonchi or  wheezing.  HEART:  Regular rate and rhythm.  No murmurs.  ABDOMEN:  Soft, nontender, bowel sounds present.  RECTAL, BREASTS, GENITOURINARY:  Not done, not pertinent to present illness.  EXTREMITIES:  Bilateral knees:  Right knee shows range of motion 5 to 115,  marked crepitus is noted, no instability.  Left knee shows range of motion 5  to 120 marked crepitus, no instability.   IMPRESSION:  1. End-stage arthritis, bilateral knees.  2. Hypercholesterolemia.  3. Sleep apnea.  4. Hearing deficit, uses bilateral hearing aids.   PLAN:  Patient admitted to [facility] to undergo bilateral total  knee replacement arthroplasties.  Surgery will be performed by Dr. Ollen Gross.      Alexzandrew L. Julien Girt, P.A.      Ollen Gross, M.D.  Electronically Signed    ALP/MEDQ  D:  06/10/2006  T:  06/11/2006  Job:  [id number]   cc:   Vanice Sarah, PA-C, [facility]

## 2010-12-23 NOTE — H&P (Signed)
NAME:  [name] [name], DIKE NO.:  [id number]   MEDICAL RECORD NO.:  [id number]          PATIENT TYPE:  IPS   LOCATION:  4031                         FACILITY:  [facility]   PHYSICIAN:  Ellwood Dense, M.D.   DATE OF BIRTH:  Feb 01, 1956   DATE OF ADMISSION:  06/14/2006  DATE OF DISCHARGE:                                HISTORY & PHYSICAL   PRIMARY CARE PHYSICIAN:  Primary care physician:  Dr. Kevin Fenton; Orthopedist:  Ollen Gross, M.D.   HISTORY OF PRESENT ILLNESS:  Ms. Blouch is a 55 year old female with history  of sleep apnea and bilateral knee pain which was diagnosed as end-stage  osteoarthritis.   The patient elected to undergo bilateral total knee replacement June 11, 2006 performed by Dr. Lequita Halt.  Postoperatively she was allowed weight-  bearing as tolerated and a CPM machine was used for passive range of motion.  Coumadin was started for deep venous thrombosis prophylaxis with INR of 1.2  today.  She developed postoperative anemia secondary to acute blood loss and  had a hemoglobin and hematocrit of 8.7 and 24.6 along with mild hyponatremia  with sodium of 132 secondary to IV fluids.  Therapy has been ongoing with  the patient having elevated anxiety levels.  There has been slow progression  with therapy.   The patient was evaluated by the rehabilitation physicians and felt to be an  appropriate candidate for inpatient rehabilitation.   REVIEW OF SYSTEMS:  Positive for deafness wearing hearing aids,  constipation, wound healing, rash, weakness and anxiety.   PAST MEDICAL HISTORY:  1. History of sleep apnea.  2. History of hearing loss secondary to frequent infections as a child.  3. Dyslipidemia.  4. Depression.  5. Anxiety.  6. Prior hysterectomy.   FAMILY HISTORY:  Positive for osteoarthritis and depression.   SOCIAL HISTORY:  The patient is married and independent working for [employer].  She reports she used tobacco for 10 years and reports alcohol use  of 2 to 3  glasses of wine per week.   FUNCTIONAL HISTORY:  Prior to admission independent and driving.   ALLERGIES:  No known drug allergies.   MEDICATIONS:  Prior to admission Effexor XR 150 mg daily.   LABORATORY DATA:  Recent labs showed hemoglobin 8.7, hematocrit 24.6,  platelet count 203,000, white count of 7.9.  Recent sodium was 132,  potassium 4.2, chloride 97, CO2 27, BUN 5, creatinine 0.5 and glucose 108.   PHYSICAL EXAMINATION:  GENERAL APPEARANCE:  Reasonably well-appearing middle  aged adult female lying in bed in no acute discomfort.  VITAL SIGNS:  Blood pressure 110/69 with pulse of 103, respiratory rate 20  and temperature 98.6.  HEENT:  Normocephalic, nontraumatic.  CARDIOVASCULAR:  Regular rate and rhythm, S1, S2 without murmurs.  ABDOMEN:  Soft, obese, nontender with positive bowel sounds.  LUNGS:  Clear to auscultation bilaterally.  NEUROLOGICAL:  Alert and oriented x3 with cranial nerve exam showing  decreased hearing.  The patient has 5/5 strength throughout the bilateral  upper extremities.  She does have a rash on her back secondary  to tape  dermatitis.  EXTREMITIES:  Exam of the bilateral lower extremities show Steri-Strips in  place with mild erythema and no drainage.  She has some minimal edema of the  bilateral knees.  She has 4-/5 strength throughout the bilateral lower  extremities statically.   IMPRESSION:  1. Status post bilateral total knee replacements, postoperative day #3.  2. Presently the patient has deficits in activities of daily living,      transfers and ambulation secondary to the bilateral total knee      replacements.   PLAN:  1. Admit to the rehabilitation unit for daily physical therapy and      occupational therapy therapies to advance activities of daily living,      transfers and ambulation.  2. Check admission labs including CBC and CMET Friday, June 15, 2006.  3. Continue Lovenox 30 mg subcutaneously b.i.d. until INR  consistently      greater than 2.0.  4. Coumadin per pharmacy.  5. Continue iron sulfate 325 mg p.o. b.i.d.  6. Continue Zetia 10 mg q.h.s. for dyslipidemia.  7. Continue Effexor XR 150 mg daily for depression.  8. Oxycodone 5 mg one to two tablets p.o. q.4h. p.r.n. for pain.  9. 24 hour nursing for medication administration, monitoring of vital      signs and wound dressing changes as needed.   PROGNOSIS:  Good.   ESTIMATED LENGTH OF STAY:  Estimated length of stay is 8 to 12 days.   GOALS:  Modified independent, activities of daily living, transfers and  ambulation.           ______________________________  Ellwood Dense, M.D.     DC/MEDQ  D:  06/15/2006  T:  06/15/2006  Job:  [id number]

## 2010-12-23 NOTE — H&P (Signed)
NAME:  HAIDEN, CLUCAS NO.:  [id number]   MEDICAL RECORD NO.:  [id number]          PATIENT TYPE:  INP   LOCATION:  9316                          FACILITY:  [facility]   PHYSICIAN:  Zelphia Cairo, MD    DATE OF BIRTH:  01-23-1956   DATE OF ADMISSION:  09/25/2005  DATE OF DISCHARGE:                                HISTORY & PHYSICAL   HISTORY OF PRESENT ILLNESS:  This is a 55 year old, white female with  complaints of menorrhagia.  She reports that her last period began  approximately 4 months ago and is very heavy associated with passing clots.  She reports that her periods have become longer lasting anywhere from 1-3  weeks and have become much heavier.  She has had tried several alternative  medical therapies without relief.  She also notes some back pain.  She  denies any bowel or bladder complaints.  She denies hot flashes or vaginal  dryness.   PAST MEDICAL HISTORY:  Osteoarthritis.   PAST SURGICAL HISTORY:  Right knee surgery.   PAST OBSTETRICAL HISTORY:  Negative.   ALLERGIES:  Negative.   MEDICATIONS:  Effexor.   SOCIAL HISTORY:  Negative for tobacco, alcohol and drug use.   PAST GYNECOLOGICAL HISTORY:  Menarche at age 6.  Last Pap smear and  mammogram were in October 2006, and were both normal.  She denies any  history of abnormal Pap smears.   PHYSICAL EXAMINATION:  VITAL SIGNS:  Height 5 feet 1 inches, weight 185.  Blood pressure 112/74, hemoglobin 12.  ABDOMEN:  Soft, nontender, nondistended.  BREASTS:  Symmetrical without palpable masses or lesions.  There is no  nipple discharge.  PELVIC:  Normal external female genitalia.  Uterus is small, globular, but  nontender.  There are no definite masses palpated.   LABORATORY DATA AND X-RAY FINDINGS:  Transabdominal ultrasound and  sonohistogram were performed in the office.  There was noted to be a 2.2 cm  fibroid in the posterior wall of the uterus.  Left and right ovaries appear  normal.  On saline  infusion, there were no intracavitary masses noted.  Endometrial thickness of 1.1 mm.   ASSESSMENT/PLAN:  A 55 year old, white female with a fibroid uterus and  menometrorrhagia.  Results were discussed with the patient including options  of hysterectomy versus medical management.  The patient declines medical  management at this time and desires to proceed with a hysterectomy.  We will  proceed with scheduling laparoscopically assisted vaginal  hysterectomy/bilateral salpingo-oophorectomy (LAVH/BSO).      Zelphia Cairo, MD  Electronically Signed     GA/MEDQ  D:  09/22/2005  T:  09/23/2005  Job:  [id number]

## 2011-11-02 ENCOUNTER — Other Ambulatory Visit: Payer: Self-pay | Admitting: Physician Assistant

## 2012-09-04 DIAGNOSIS — H9190 Unspecified hearing loss, unspecified ear: Secondary | ICD-10-CM | POA: Insufficient documentation

## 2013-06-25 ENCOUNTER — Ambulatory Visit
Admission: RE | Admit: 2013-06-25 | Discharge: 2013-06-25 | Disposition: A | Discharge status: Home / Self Care | Payer: 59 | Source: Ambulatory Visit | Attending: Family | Admitting: Family

## 2013-06-25 ENCOUNTER — Other Ambulatory Visit: Payer: Self-pay | Admitting: Family

## 2013-06-25 DIAGNOSIS — M25552 Pain in left hip: Secondary | ICD-10-CM

## 2013-07-14 ENCOUNTER — Encounter: Payer: Self-pay | Admitting: Neurology

## 2013-07-16 ENCOUNTER — Ambulatory Visit (INDEPENDENT_AMBULATORY_CARE_PROVIDER_SITE_OTHER): Payer: 59 | Admitting: Neurology

## 2013-07-16 ENCOUNTER — Encounter (INDEPENDENT_AMBULATORY_CARE_PROVIDER_SITE_OTHER): Payer: Self-pay

## 2013-07-16 ENCOUNTER — Encounter: Payer: Self-pay | Admitting: Neurology

## 2013-07-16 VITALS — BP 123/77 | HR 79 | Resp 17 | Ht 62.5 in | Wt 203.0 lb

## 2013-07-16 DIAGNOSIS — G3184 Mild cognitive impairment, so stated: Secondary | ICD-10-CM

## 2013-07-16 DIAGNOSIS — F341 Dysthymic disorder: Secondary | ICD-10-CM

## 2013-07-16 DIAGNOSIS — F419 Anxiety disorder, unspecified: Secondary | ICD-10-CM | POA: Insufficient documentation

## 2013-07-16 DIAGNOSIS — F418 Other specified anxiety disorders: Secondary | ICD-10-CM | POA: Insufficient documentation

## 2013-07-16 HISTORY — DX: Mild cognitive impairment of uncertain or unknown etiology: G31.84

## 2013-07-16 NOTE — Progress Notes (Addendum)
[facility]  Provider:  Melvyn Novas, M D  Referring Provider: Aura Dials, MD Primary Care Physician:  [name] [name], [name] Corti, NP  Chief Complaint  Patient presents with  . Memory Loss    HPI:  [name] [name] is a 57 y.o. female  Is seen here as a referral/ revisit  from Dr. Everlene Other for subjective Cognitive impairment, concentration problems.    She has been tearful when giving me this history, her job is becoming difficult. She has been placed on probation at her employment ,and  her job description has changed several times.  She works for [employer] at [employer]. 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 Retracing costs her time and she is "timed' to work on a certain number of cases per work day. She is now for the first time unable to fulfill these case numbers. She gets frustrated and anxious when losing the trace of thought. She has had panic attacks , trembling inside , a feeling of heaviness coming form her head down and leaving her anxious.    She has a scrabble group and she still wins every time, there is no word finding or spelling difficulties. She has seen a Manufacturing systems engineer after her partner of 26 years asked her to, and was found to have a B12 deficiency, which is now supplemented and takes fish oil.  Her life long she was having difficulties with directions, difficulties with appointments. She will not remember  [place] /west directions, not  remember the street names-  but orients herself on landmarks. In her PCP's office she performed on an MMSE but I don't have the score. Here, the  [facility] test score was 30-30 points. No evidence of memory loss.   The patient has regular sleep habits, her bed time is 22.30 regular- she wears a CPAP  For 9 years without any follow up until last year -and sleeps promptly . One year ago she changed to BiPAP upon  referral by ENT Dr. Pollyann [name].  Sometimes uses an Ambien to help fall asleep if she is upset ( once a week) , sleeps through the night, no nocturia.  Wakes at 6.10 AM and lets the dogs out, uses an alarm. She has a hearing deficit and uses a vibration pillow alarm. Coffee 2 cups each morning, no daytime naps.  Coffee 2 more cups in daytime -No history of shift work.    [name] [name] has a very high body mass index, has hyperlipidemia and is treated for depression on Effexor. She is status post bilateral knee replacement. Hysterectomy.   The patient has a maternal and paternal history of depression and is affected by stress, depression and anxiety, her brother committed suicide 2 years ago. Her mother was diagnosed with  Early Alzheimer's dementia about 3.5 years ago.    Review of Systems: Out of a complete 14 system review, the patient complains of only the following symptoms, and all other reviewed systems are negative.  Of systems was endorsed for snoring, joint pain, aching muscle,  memory loss/ confusion /depression/ anxiety /decreased levels of energy, less interest in activities and racing thoughts she also has a history of joint pain and hearing loss.  History   Social History  . Marital Status: Married    Spouse Name: N/A    Number of Children: 0  . Years of Education: N/A   Occupational  History  .     Social History Main Topics  . Smoking status: Former Games developer  . Smokeless tobacco: Not on file     Comment: Pt quit 17 yrs ago/smoked for 12 yrs  . Alcohol Use: 1.2 - 1.8 oz/week    2-3 Glasses of wine per week     Comment: social drinker/Caffeine 4-6 cups a day  . Drug Use: No  . Sexual Activity: Yes    Partners: Female   Other Topics Concern  . Not on file   Social History Narrative   Works full-time @ [employer]    Has a cat,3 dogs    lives with Partner(Female)[name] [name]    History reviewed. No pertinent family history.  Past Medical History  Diagnosis Date  . Memory changes    . OSA (obstructive sleep apnea)   . Joint pain   . Deafness     Wears Bilat hearing aids since Childhood  . Obese   . H/O mammogram 07/15/2012  . Pap test, as part of routine gynecological examination     02/25/2010  . Mild cognitive impairment, so stated 07/16/2013    Patient subjectively complains of word finding and memory deficits. MOCA 30-30 on 07-16-13 , paraphrasic errors- uses words such as  closet for garage     Past Surgical History  Procedure Laterality Date  . Replacement total knee Bilateral 06/2006  . Total vaginal hysterectomy  2007    COMPLETE    Current Outpatient Prescriptions  Medication Sig Dispense Refill  . travoprost, benzalkonium, (TRAVATAN) 0.004 % ophthalmic solution 1 drop at bedtime.      Marland Kitchen venlafaxine XR (EFFEXOR-XR) 150 MG 24 hr capsule Take 150 mg by mouth daily with breakfast.      . zolpidem (AMBIEN) 10 MG tablet Take 10 mg by mouth at bedtime as needed for sleep.       No current facility-administered medications for this visit.    Allergies as of 07/16/2013  . (No Known Allergies)    Vitals: BP 123/77  Pulse 79  Resp 17  Ht 5' 2.5" (1.588 m)  Wt 203 lb (92.08 kg)  BMI 36.51 kg/m2 Last Weight:  Wt Readings from Last 1 Encounters:  07/16/13 203 lb (92.08 kg)   Last Height:   Ht Readings from Last 1 Encounters:  07/16/13 5' 2.5" (1.588 m)    Physical exam:  General: The patient is awake, alert and appears not in acute distress. The patient is well groomed. Head: Normocephalic, atraumatic. Neck is supple. Mallampotti 3, neck circumference: 16 inches, mild retrognathia.  Cardiovascular:  Regular rate and rhythm , without  murmurs or carotid bruit, and without distended neck veins. Respiratory: Lungs are clear to auscultation. Skin:  Without evidence of edema, or rash Trunk: BMI is elevated,  and patient  has normal posture.  Neurologic exam : The patient is awake and alert, oriented to place and time.   Memory subjective   described as impaired . There is a normal attention span & concentration ability.  Speech is fluent without  dysarthria, dysphonia or aphasia. Mood and affect are appropriate.  Cranial nerves:  Smell and taste intact- Pupils are equal and briskly reactive to light. Funduscopic exam without evidence of pallor or edema.  Extraocular movements  in vertical and horizontal planes intact and without nystagmus.  Visual fields by finger perimetry are intact. Hearing to finger rub intact.  Facial sensation intact to fine touch. Facial motor strength is symmetric and tongue and uvula move  midline.  Motor exam:  Normal tone and normal muscle bulk and symmetric normal strength in all extremities.  Sensory:  Fine touch, pinprick and vibration were tested in all extremities.  Proprioception is tested in the upper extremities -normal.  Coordination: Rapid alternating movements in the fingers/hands is tested and normal. Finger-to-nose maneuver tested and normal without evidence of ataxia, dysmetria or tremor.  Gait and station: Patient walks without assistive device . Strength within normal limits. Stance is stable and base is wide . Tandem gait is very fragmented, ataxic .  Romberg testing is normal.  Deep tendon reflexes: in the  upper and lower extremities are symmetric and intact. Babinski maneuver  downgoing.   Assessment:  After physical and neurologic examination, review of laboratory studies, imaging, neurophysiology testing and pre-existing records, assessment is   1) subjective memory deficits , which seem to be crossing over with anxiety and depression.  2) OSA controlled on BiPAP per patients report. [facility]. 3) Ataxia , sharp burning and shooting pain right ankle upwards- B 12 deficiency ? She is on supplements , I have not been able to see labs.   Plan:  Treatment plan and additional workup :  Depression/ anxiety  is the main cause for these memory deficits with a MOCA of 30-30. B 12  deficiency  - may not be treated yet,  Needs to have TSH, HbAic tested.  Her psychologist may consider Abilify .

## 2013-07-16 NOTE — Addendum Note (Signed)
Addended by: Melvyn Novas on: 07/16/2013 10:09 AM   Modules accepted: Orders

## 2013-07-16 NOTE — Patient Instructions (Signed)
Calorie Counting Diet A calorie counting diet requires you to eat the number of calories that are right for you in a day. Calories are the measurement of how much energy you get from the food you eat. Eating the right amount of calories is important for staying at a healthy weight. If you eat too many calories, your body will store them as fat and you may gain weight. If you eat too few calories, you may lose weight. Counting the number of calories you eat during a day will help you know if you are eating the right amount. A Registered Dietitian can determine how many calories you need in a day. The amount of calories needed varies from person to person. If your goal is to lose weight, you will need to eat fewer calories. Losing weight can benefit you if you are overweight or have health problems such as heart disease, high blood pressure, or diabetes. If your goal is to gain weight, you will need to eat more calories. Gaining weight may be necessary if you have a certain health problem that causes your body to need more energy. TIPS Whether you are increasing or decreasing the number of calories you eat during a day, it may be hard to get used to changes in what you eat and drink. The following are tips to help you keep track of the number of calories you eat.  Measure foods at home with measuring cups. This helps you know the amount of food and number of calories you are eating.  Restaurants often serve food in amounts that are larger than 1 serving. While eating out, estimate how many servings of a food you are given. For example, a serving of cooked rice is  cup or about the size of half of a fist. Knowing serving sizes will help you be aware of how much food you are eating at restaurants.  Ask for smaller portion sizes or child-size portions at restaurants.  Plan to eat half of a meal at a restaurant. Take the rest home or share the other half with a friend.  Read the Nutrition Facts panel on  food labels for calorie content and serving size. You can find out how many servings are in a package, the size of a serving, and the number of calories each serving has.  For example, a package might contain 3 cookies. The Nutrition Facts panel on that package says that 1 serving is 1 cookie. Below that, it will say there are 3 servings in the container. The calories section of the Nutrition Facts label says there are 90 calories. This means there are 90 calories in 1 cookie (1 serving). If you eat 1 cookie you have eaten 90 calories. If you eat all 3 cookies, you have eaten 270 calories (3 servings x 90 calories = 270 calories). The list below tells you how big or small some common portion sizes are.  1 oz.........4 stacked dice.  3 oz.........Deck of cards.  1 tsp........Tip of little finger.  1 tbs........Thumb.  2 tbs........Golf ball.   cup.......Half of a fist.  1 cup........A fist. KEEP A FOOD LOG Write down every food item you eat, the amount you eat, and the number of calories in each food you eat during the day. At the end of the day, you can add up the total number of calories you have eaten. It may help to keep a list like the one below. Find out the calorie information by reading the   Nutrition Facts panel on food labels. Breakfast  Bran cereal (1 cup, 110 calories).  Fat-free milk ( cup, 45 calories). Snack  Apple (1 medium, 80 calories). Lunch  Spinach (1 cup, 20 calories).  Tomato ( medium, 20 calories).  Chicken breast strips (3 oz, 165 calories).  Shredded cheddar cheese ( cup, 110 calories).  Light Italian dressing (2 tbs, 60 calories).  Whole-wheat bread (1 slice, 80 calories).  Tub margarine (1 tsp, 35 calories).  Vegetable soup (1 cup, 160 calories). Dinner  Pork chop (3 oz, 190 calories).  Brown rice (1 cup, 215 calories).  Steamed broccoli ( cup, 20 calories).  Strawberries (1  cup, 65 calories).  Whipped cream (1 tbs, 50  calories). Daily Calorie Total: 1425 Document Released: 07/24/2005 Document Revised: 10/16/2011 Document Reviewed: 01/18/2007 ExitCare Patient Information 2014 [employer].  

## 2013-07-18 ENCOUNTER — Telehealth: Payer: Self-pay | Admitting: *Deleted

## 2013-07-18 NOTE — Telephone Encounter (Signed)
Please advise 

## 2013-07-18 NOTE — Telephone Encounter (Signed)
Dr Everlene Other was willing to take patient out of work and she has an appointment with psychiatry , too. Her inability to work is due to anxeity / depression panic disorder.

## 2013-07-21 ENCOUNTER — Telehealth: Payer: Self-pay | Admitting: Neurology

## 2013-07-21 NOTE — Telephone Encounter (Signed)
I attempted to contact patient at [phone]. A female indicated that there was no one at that number with that name.  I called the home number [phone]. The VM had another name Oliviana _____. I called the cell number and there was a message that indicated it was the correct person but, it had a message that person was out of the office. I left a VM on this phone stating that we are unable to generate a letter for leave. Please contact Dr. Everlene Other who, we understand, took patient out of work.

## 2013-07-21 NOTE — Telephone Encounter (Signed)
i have no problem taking patient a out of work for 10 days to watch if the changes in medication are helping her. CD     I dictated this as an Airline pilot.  To whom it may concern, My patient [name]  Reader [name], born 01-24-2056,  is due to a medical condition not able to attend  Her work  from 15- 8-14 through 07-27-13,  Be under medical observation in that time frame.  Please contact our office for further information as needed, with patient's permission.   Melvyn Novas , M.D.  [facility].

## 2013-07-22 ENCOUNTER — Other Ambulatory Visit (HOSPITAL_COMMUNITY): Payer: 59 | Attending: Psychiatry | Admitting: Psychiatry

## 2013-07-22 ENCOUNTER — Encounter: Payer: Self-pay | Admitting: Neurology

## 2013-07-22 ENCOUNTER — Encounter (HOSPITAL_COMMUNITY): Payer: Self-pay

## 2013-07-22 DIAGNOSIS — F411 Generalized anxiety disorder: Secondary | ICD-10-CM

## 2013-07-22 DIAGNOSIS — F331 Major depressive disorder, recurrent, moderate: Secondary | ICD-10-CM | POA: Insufficient documentation

## 2013-07-22 DIAGNOSIS — F418 Other specified anxiety disorders: Secondary | ICD-10-CM

## 2013-07-22 MED ORDER — ARIPIPRAZOLE 5 MG PO TABS: 5.0000 mg | ORAL_TABLET | Freq: Every day | ORAL | Status: DC
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
## 2013-07-22 NOTE — Telephone Encounter (Signed)
I called and left patient a VM that Dr. Vickey Huger has generated a letter for you. I am putting it in the mail today.

## 2013-07-22 NOTE — Progress Notes (Signed)
    Daily Group Progress Note  Program: IOP  Group Time: 9:00-10:30 am   Participation Level: None  Behavioral Response: none  Type of Therapy:  Initial Assessment  Summary of Progress: Pt was completing the initial assessment and orientation with the case manager      Group Time: 10:30 am - 12:00 pm   Participation Level:  None  Behavioral Response: none  Type of Therapy: Initial Medical Assessment  Summary of Progress: Pt was completing the initial assessment and orientation with the psychiatrist  Carman Ching, [name]

## 2013-07-23 ENCOUNTER — Other Ambulatory Visit (HOSPITAL_COMMUNITY): Payer: 59 | Admitting: Psychiatry

## 2013-07-23 DIAGNOSIS — F411 Generalized anxiety disorder: Secondary | ICD-10-CM

## 2013-07-23 DIAGNOSIS — F418 Other specified anxiety disorders: Secondary | ICD-10-CM

## 2013-07-24 ENCOUNTER — Other Ambulatory Visit (HOSPITAL_COMMUNITY): Payer: 59 | Admitting: Psychiatry

## 2013-07-24 DIAGNOSIS — F418 Other specified anxiety disorders: Secondary | ICD-10-CM

## 2013-07-24 DIAGNOSIS — F411 Generalized anxiety disorder: Secondary | ICD-10-CM

## 2013-07-24 NOTE — Progress Notes (Signed)
    Daily Group Progress Note  Program: IOP  Group Time: 9:00-10:30 am   Participation Level: Active  Behavioral Response: Appropriate  Type of Therapy:  Process Group  Summary of Progress: pt worked on her goal of depression. She was quiet, but attentive. She said she enjoyed listening and relating to others stories so she does not feel alone. Pt said she was glad she decided to return to the group.      Group Time: 10:30 am - 12:00 pm   Participation Level:  Active  Behavioral Response: Appropriate  Type of Therapy: Psycho-education Group  Summary of Progress: Pt learned about how to make a daily wellness list to ensure mood stability and reduce relapses.   Carman Ching, [name]

## 2013-07-24 NOTE — Progress Notes (Signed)
    Daily Group Progress Note  Program: IOP  Group Time: 9:00-10:30 am   Participation Level: Minimal  Behavioral Response: Appropriate  Type of Therapy:  Process Group  Summary of Progress: Pt worked on her goal of depression by trying to get comfortable with sharing within the group setting. Pt was encouraged to start sharing, although chose to listen today. Pt was attentive.      Group Time: 10:30 am - 12:00 pm   Participation Level:  Active  Behavioral Response: Appropriate  Type of Therapy: Psycho-education Group  Summary of Progress: Pt  Learned about the symptoms of anxiety and bipolar disorder and how to track symptoms to ensure overall mood stability and wellness.  Carman Ching, [name]

## 2013-07-25 ENCOUNTER — Other Ambulatory Visit (HOSPITAL_COMMUNITY): Payer: 59 | Admitting: Psychiatry

## 2013-07-25 DIAGNOSIS — F411 Generalized anxiety disorder: Secondary | ICD-10-CM

## 2013-07-25 DIAGNOSIS — F331 Major depressive disorder, recurrent, moderate: Secondary | ICD-10-CM

## 2013-07-25 NOTE — Telephone Encounter (Signed)
I left VM. Blood sugar just a touch above normal. The way to address that is lots of fruits and vegetables, no alcohol and exercise. [name] [name] is the best way to prevent it form getting worse. Again, this is just a touch above normal. Applying those strategies will be of tremendous help.

## 2013-07-25 NOTE — Telephone Encounter (Signed)
Message copied by [facility] on Fri Jul 25, 2013  3:46 PM ------      Message from: Vickey Huger, CARMEN      Created: Mon Jul 21, 2013  4:13 PM       Please inform patient that her HBA1 c is elevated, we need a weight loss approach that works for her , low carb , lots of fresh vegetables and no ETOH. ------
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
## 2013-07-28 ENCOUNTER — Other Ambulatory Visit (HOSPITAL_COMMUNITY): Payer: 59 | Admitting: Psychiatry

## 2013-07-28 ENCOUNTER — Encounter (HOSPITAL_COMMUNITY): Payer: Self-pay | Admitting: Psychiatry

## 2013-07-28 DIAGNOSIS — F411 Generalized anxiety disorder: Secondary | ICD-10-CM

## 2013-07-28 DIAGNOSIS — F418 Other specified anxiety disorders: Secondary | ICD-10-CM

## 2013-07-28 NOTE — Progress Notes (Signed)
    Daily Group Progress Note  Program: IOP  Group Time: 9:00-10:30 am   Participation Level: Active  Behavioral Response: Appropriate  Type of Therapy:  Process Group  Summary of Progress: Pt arrived late after calling staff to notify them of the delay. Pt appeared stressed out when she arrived and said she was on the verge of crying. Pt was tearful during group, but attentive to the needs of others. Pt did not share about her personal situation and was encouraged to do so in the second half of group.      Group Time: 10:30 am - 12:00 pm   Participation Level:  Active  Behavioral Response: Appropriate  Type of Therapy: Psycho-education Group  Summary of Progress: Pt participated in a group with a focus on grief and loss and identified current losses and effective grieving strategies.   Carman Ching, [name]

## 2013-07-28 NOTE — Progress Notes (Signed)
    Daily Group Progress Note  Program: IOP  Group Time: 9-10:30  Participation Level: Active  Behavioral Response: Sharing  Type of Therapy:  Process Group  Summary of Progress: Process group summary:  Patient shared her reasons for seeking treatment with the group when another group member invited her to do so.  She described the grief she feels for her brother who committed suicide several years ago, stress related to her work, and relationship difficulties with her wife.  She reported that she felt supported by the group and was relieved to share her story.  Group Time: 10:45- 12 pm  Participation Level:  Active  Behavioral Response: Sharing  Type of Therapy: Psycho-education Group  Summary of Progress: :  Patient shared her Wheel of Life with the group, noting that she was doing well in a few areas but very poorly in the areas of career, significant other/romance, and mental health.  She disclosed to the group that she was considering divorcing her wife, sharing that she believed this change, although difficult, would impact her overall wellness.  Carman Ching, [name]

## 2013-07-28 NOTE — Progress Notes (Signed)
Patient ID: [name] [name], female   DOB: 1956-01-06, 57 y.o.   MRN: [id number] Patient viewed an interview today, states that her spouse states that she has been more even keeled and not as irritable patient feels good about that. She is not anxious and jittery. Increase in the Abilify to 5 mg has helped significantly her sleep is good appetite is good mood is improving denies suicidal or homicidal ideation and has no hallucinations or delusions. Will continue all the medications at the present doses.

## 2013-07-29 ENCOUNTER — Other Ambulatory Visit (HOSPITAL_COMMUNITY): Payer: 59

## 2013-07-29 ENCOUNTER — Ambulatory Visit (HOSPITAL_COMMUNITY): Payer: Self-pay

## 2013-07-30 ENCOUNTER — Other Ambulatory Visit (HOSPITAL_COMMUNITY): Payer: 59 | Admitting: Psychiatry

## 2013-07-30 DIAGNOSIS — F418 Other specified anxiety disorders: Secondary | ICD-10-CM

## 2013-07-30 DIAGNOSIS — F411 Generalized anxiety disorder: Secondary | ICD-10-CM

## 2013-07-30 NOTE — Progress Notes (Signed)
    Daily Group Progress Note  Program: IOP  Group Time: 9:00-10:30 am   Participation Level: Active  Behavioral Response: Appropriate  Type of Therapy:  Process Group  Summary of Progress: pt worked on her goal of depression and anxiety by talking about work stress and feeling trapped into making a decision to go back to work and keep her good salary and benefits vs finding a new job that is a better fit for her mental wellness.      Group Time: 10:30 am - 12:00 pm   Participation Level:  Active  Behavioral Response: Appropriate  Type of Therapy: Psycho-education Group  Summary of Progress: Pt learned the skill of heartmath to manage anxiety symptoms and practiced relaxation breathing.   Carman Ching, [name]

## 2013-08-01 ENCOUNTER — Other Ambulatory Visit (HOSPITAL_COMMUNITY): Payer: 59

## 2013-08-04 ENCOUNTER — Other Ambulatory Visit (HOSPITAL_COMMUNITY): Payer: 59 | Admitting: Psychiatry

## 2013-08-04 DIAGNOSIS — F418 Other specified anxiety disorders: Secondary | ICD-10-CM

## 2013-08-04 DIAGNOSIS — F411 Generalized anxiety disorder: Secondary | ICD-10-CM

## 2013-08-04 NOTE — Progress Notes (Signed)
    Daily Group Progress Note  Program: IOP  Group Time: 9:00-10:30 am   Participation Level: Active  Behavioral Response: Appropriate  Type of Therapy:  Process Group  Summary of Progress: Pt was tearful at the start of the session and she talked about not feeling "joy" in life like she used to and how sad that made her feel. Pt described the Christmas holiday this past weekend with sadness at the lack of happiness she is feeling. Pt said it helped with her own depression to hear others struggling with similar feelings. 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      Group Time: 10:30 am - 12:00 pm   Participation Level:  Active  Behavioral Response: Appropriate  Type of Therapy: Psycho-education Group  Summary of Progress: Pt participated in a group with a focus on grief and loss and explored current losses impacting overall wellness and effective grieving strategies.  Carman Ching, [name]

## 2013-08-05 ENCOUNTER — Other Ambulatory Visit (HOSPITAL_COMMUNITY): Payer: 59 | Admitting: Psychiatry

## 2013-08-05 DIAGNOSIS — F418 Other specified anxiety disorders: Secondary | ICD-10-CM

## 2013-08-05 DIAGNOSIS — F411 Generalized anxiety disorder: Secondary | ICD-10-CM

## 2013-08-05 MED ORDER — ARIPIPRAZOLE 10 MG PO TABS: 5.0000 mg | ORAL_TABLET | Freq: Once | ORAL | Status: DC

## 2013-08-05 MED ORDER — ARIPIPRAZOLE 5 MG PO TABS: 5.0000 mg | ORAL_TABLET | Freq: Two times a day (BID) | ORAL | Status: DC

## 2013-08-05 NOTE — Progress Notes (Signed)
Patient ID: [name] [name], female   DOB: January 15, 1956, 57 y.o.   MRN: [id number] Patient reviewed and interviewed today, states that her medications is not not working and she wants to change the medications. Discussed with patient's her social stressors which include her partner was pressured with her to get well and is not supportive of her less than one circuit returned to work so that the health benefits are continued. Discussed setting limits with a partner or so that she is support. Sleep is disrupted appetite is good, no suicidal or homicidal ideation no hallucinations or delusions. Increase Abilify to 5 mg twice a day.

## 2013-08-05 NOTE — Progress Notes (Signed)
    Daily Group Progress Note  Program: IOP  Group Time: 9:00-10:30 am   Participation Level: Active  Behavioral Response: Appropriate  Type of Therapy:  Process Group  Summary of Progress: Pt worked on her goal of depression and talked about feeling "hopeless" and "not wanting to be here anymore". Pt said she did not feel she needed to go into the hospital, but was aware that her depression symptoms are worsening and Pt was very tearful. Pt described the lack of support in her relationship to her partner as her current primary stressor. Writer sent Pt to be assessed by Dr. Karie Schwalbe and the case manager for safety assessment and Pt was assessed by them during group time.      Group Time: 10:30 am - 12:00 pm   Participation Level:  Active  Behavioral Response: Appropriate  Type of Therapy: Psycho-education Group  Summary of Progress: Pt learned about Triggers and identified personal triggers getting in the way of wellness and causing anxiety, anger and depression symptoms.   Carman Ching, [name]

## 2013-08-06 ENCOUNTER — Other Ambulatory Visit (HOSPITAL_COMMUNITY): Payer: 59 | Admitting: Psychiatry

## 2013-08-06 DIAGNOSIS — F411 Generalized anxiety disorder: Secondary | ICD-10-CM

## 2013-08-06 DIAGNOSIS — F418 Other specified anxiety disorders: Secondary | ICD-10-CM

## 2013-08-06 NOTE — Progress Notes (Signed)
    Daily Group Progress Note  Program: IOP  Group Time: 9:00-10:30 am   Participation Level: Active  Behavioral Response: Appropriate  Type of Therapy:  Process Group  Summary of Progress: Pt worked on her goal of depression. Pt talks about the stress in her relationship with her partner, which appears to be controlling. Pt is struggling with appeasing her and her tendency towards co-dependance. Pt was less tearful today and more in control of her emotions. Pt continues to have stress at home and job stress that is making it difficult for Pt to function.       Group Time: 10:30 am - 12:00 pm   Participation Level:  Active  Behavioral Response: Appropriate  Type of Therapy: Psycho-education Group  Summary of Progress: Pt learned about overcaring and how this affects anxiety and depression and how to set healthier limits with others.   Carman Ching, [name]

## 2013-08-06 NOTE — Progress Notes (Signed)
Patient ID: [name] [name], female   DOB: 10/12/1955, 57 y.o.   MRN: [id number] D:  Pt's spouse called voicing concerns about pt.  States she feels that pt is decompensating and would like something done about her medications that she feels aren't working.  "She cries all the time. Is going further in a hole."  [name] states she is off on this Friday and would like to talk to Dr. Rutherford Limerick.  [name] can be reached at [phone].

## 2013-08-08 ENCOUNTER — Other Ambulatory Visit (HOSPITAL_COMMUNITY): Payer: 59 | Attending: Psychiatry | Admitting: Psychiatry

## 2013-08-08 DIAGNOSIS — F418 Other specified anxiety disorders: Secondary | ICD-10-CM

## 2013-08-08 DIAGNOSIS — F411 Generalized anxiety disorder: Secondary | ICD-10-CM

## 2013-08-08 DIAGNOSIS — F332 Major depressive disorder, recurrent severe without psychotic features: Secondary | ICD-10-CM | POA: Insufficient documentation

## 2013-08-08 NOTE — Progress Notes (Signed)
    Daily Group Progress Note  Program: IOP  Group Time: 9:00-10:30 am   Participation Level: Active  Behavioral Response: Appropriate  Type of Therapy:  Process Group  Summary of Progress: pt processed job stress and fears about not being able to meet job requirements and the possibilites of getting let go. Pt expressed the anxiety this is causing her and received support from the group.      Group Time: 10:30 am - 12:00 pm   Participation Level:  Active  Behavioral Response: Appropriate  Type of Therapy: Psycho-education Group  Summary of Progress: pt participated in a guided relaxation to help with depression and anxiety symptoms.   Carman ChingENGLEHORN,Akhila Mahnken E, [name]

## 2013-08-11 ENCOUNTER — Other Ambulatory Visit (HOSPITAL_COMMUNITY): Payer: 59 | Admitting: Psychiatry

## 2013-08-11 DIAGNOSIS — F418 Other specified anxiety disorders: Secondary | ICD-10-CM

## 2013-08-11 DIAGNOSIS — F411 Generalized anxiety disorder: Secondary | ICD-10-CM

## 2013-08-11 NOTE — Progress Notes (Signed)
    Daily Group Progress Note  Program: IOP  Group Time: 9:00-10:30 am   Participation Level: Active  Behavioral Response: Appropriate  Type of Therapy:  Process Group  Summary of Progress: pt worked on her goal of depression and job stress. Pt talked about the immense fear she has about returning to work and fears of being fired. Pt was challenged to explore all fears about returning and start to make a plan of action in case she is fired so she feels empowered and can start working towards other options. Pt said this train of thought helped her to feel more in control of a situation that feels out of control.      Group Time: 10:30 am - 12:00 pm   Participation Level:  Active  Behavioral Response: Appropriate  Type of Therapy: Psycho-education Group  Summary of Progress: Pt participated in a group on grief and loss and identified current losses and appropriate grieving strategies.   Carman ChingENGLEHORN,Darrik Richman E, [name]

## 2013-08-11 NOTE — Progress Notes (Signed)
Patient ID: [name] [name], female   DOB: April 06, 1956, 58 y.o.   MRN: [id number] A:  [facility] ([phone]), spoke to Deidra re: extending patient's return to work date to 09-02-13 due to upcoming new appointment with Dr. Lolly MustacheArfeen on 09-01-13.  Deidra states she will document it in the system and send it to pt's case manager.

## 2013-08-12 ENCOUNTER — Encounter (HOSPITAL_COMMUNITY): Payer: 59 | Attending: Psychiatry | Admitting: Psychiatry

## 2013-08-12 ENCOUNTER — Other Ambulatory Visit (HOSPITAL_COMMUNITY): Payer: 59

## 2013-08-12 DIAGNOSIS — F411 Generalized anxiety disorder: Secondary | ICD-10-CM

## 2013-08-12 DIAGNOSIS — F418 Other specified anxiety disorders: Secondary | ICD-10-CM

## 2013-08-12 DIAGNOSIS — F341 Dysthymic disorder: Secondary | ICD-10-CM | POA: Insufficient documentation

## 2013-08-12 MED ORDER — BUPROPION HCL ER (SR) 100 MG PO TB12: 100.0000 mg | ORAL_TABLET | Freq: Two times a day (BID) | ORAL | Status: DC

## 2013-08-12 NOTE — Progress Notes (Signed)
Patient ID: [name] [name], female   DOB: [birth year], 58 y.o.   MRN:Bethann Punches [id number] Patient reviewed and interviewed today, she gave her permission for us partner [name] to join us and case manager Jeri ModenaRita Clark also joined us. [name] feels that the patient is not improving and wants a medication change. Discussed this with the patient who has discontinued her Abilify since she stated she had a headache on the Abilify. This she states occurred after it was increased to twice a day dosing although patient had been on the Abilify for a long time but no headaches. In gathering more information about her headache it appears to have been a tension headache. Her partner would like her to get spell and get going and it was explained to her her partner are intact this was not an exact science and that it to tying and support from all avenues including her. Since she has discontinued her Abilify she wants another antidepressant and so I discussed the rationale risks benefits options off Wellbutrin SR 100 mg a.m. and p.m. and she gave me her informed consent. Patient will start this tomorrow. Denies suicidal or homicidal ideation and has no hallucinations or delusions.

## 2013-08-13 ENCOUNTER — Encounter (HOSPITAL_COMMUNITY): Payer: 59 | Admitting: Psychiatry

## 2013-08-13 ENCOUNTER — Other Ambulatory Visit (HOSPITAL_COMMUNITY): Payer: 59

## 2013-08-13 DIAGNOSIS — F411 Generalized anxiety disorder: Secondary | ICD-10-CM

## 2013-08-13 DIAGNOSIS — F418 Other specified anxiety disorders: Secondary | ICD-10-CM

## 2013-08-13 NOTE — Progress Notes (Signed)
    Daily Group Progress Note  Program: IOP  Group Time: 9:00-10:30 am   Participation Level: Active  Behavioral Response: Appropriate  Type of Therapy:  Process Group  Summary of Progress: Pt worked on her goal of depression today by discussing the stress in her relationship with her partner [name]. Pt also expressed frustration with not feeling like her medications are working well to treat her depression. Pt has a scheduled meeting today with the doctor and her partner. Pt is getting better with her assertiveness skills and expressing her needs within the group.      Group Time: 10:30 am - 12:00 pm   Participation Level:  Active  Behavioral Response: Appropriate  Type of Therapy: Psycho-education Group  Summary of Progress: Pt learned the skill of how to use relaxation to manage the feelings of anxiety and depression and practiced a relaxation technique.   Carman ChingENGLEHORN,[name] Millirons E, [name]

## 2013-08-13 NOTE — Progress Notes (Signed)
    Daily Group Progress Note  Program: IOP  Group Time: 9:00-10:30 am   Participation Level: Active  Behavioral Response: Appropriate  Type of Therapy:  Process Group  Summary of Progress: Pt worked on her goal of depression today by discussing the stress in her relationship with her partner [name]. Pt also expressed frustration with not feeling like her medications are working well to treat her depression. Pt has a scheduled meeting today the doctor to discuss her medications.      Group Time: 10:30 am - 12:00 pm   Participation Level:  Active  Behavioral Response: Appropriate  Type of Therapy: Psycho-education Group  Summary of Progress: Pt learned the skill of progressive muscle relaxation and discussed how it can be used to manage anxiety and depression symptoms. Pt practiced the skill during group and learned ways to access it at home for continued use.   Carman ChingENGLEHORN,Oronde Hallenbeck E, [name]

## 2013-08-14 ENCOUNTER — Other Ambulatory Visit (HOSPITAL_COMMUNITY): Payer: 59

## 2013-08-14 ENCOUNTER — Encounter (HOSPITAL_COMMUNITY): Payer: 59 | Admitting: Psychiatry

## 2013-08-15 ENCOUNTER — Encounter (HOSPITAL_COMMUNITY): Payer: 59

## 2013-08-15 ENCOUNTER — Other Ambulatory Visit (HOSPITAL_COMMUNITY): Payer: 59

## 2013-08-15 NOTE — Progress Notes (Signed)
    Daily Group Progress Note  Program: IOP  Group Time: 9:00-10:30 am   Participation Level: Active  Behavioral Response: Appropriate  Type of Therapy:  Process Group  Summary of Progress: Pt worked on her goal of depression by processesing work and relationship stress. Pt said she is feeling more confident and assertive.      Group Time: 10:30 am - 12:00 pm   Participation Level:  Active  Behavioral Response: Appropriate  Type of Therapy: Psycho-education Group  Summary of Progress: Pt learned the skill of how to use relaxation to manage the feelings of anxiety and depression and practiced a relaxation technique.   Carman ChingENGLEHORN,Polo Mcmartin E, [name]

## 2013-08-18 ENCOUNTER — Other Ambulatory Visit (HOSPITAL_COMMUNITY): Payer: 59

## 2013-08-18 ENCOUNTER — Encounter (HOSPITAL_COMMUNITY): Payer: 59

## 2013-08-19 ENCOUNTER — Other Ambulatory Visit (HOSPITAL_COMMUNITY): Payer: 59

## 2013-08-19 ENCOUNTER — Encounter (HOSPITAL_COMMUNITY): Payer: 59 | Admitting: Psychiatry

## 2013-08-19 DIAGNOSIS — F418 Other specified anxiety disorders: Secondary | ICD-10-CM

## 2013-08-19 DIAGNOSIS — F411 Generalized anxiety disorder: Secondary | ICD-10-CM

## 2013-08-19 NOTE — Progress Notes (Signed)
Patient ID: [name] [name], female   DOB: [birth year], 58 y.o.   MRN: [id number] Patient reviewed and interviewed today, states that she feels better since stopping the Abilify complains of mild restlessness at times but notes significant improvement in her mood and decrease in her panic. States that after the meeting that to be had with her partner [name], her partner has been more supportive of her and has told her that if she does not want to continue in her job she can quit. Patient states that this has taken a big load off her mind. Sleep and appetite are good mood is good no suicidal or homicidal ideation no hallucinations or delusions. She is tolerating her medications well and coping well.

## 2013-08-19 NOTE — Progress Notes (Signed)
    Daily Group Progress Note  Program: IOP  Group Time: 9:00-10:30 am   Participation Level: Active  Behavioral Response: Sharing  Type of Therapy:  Process Group  Summary of Progress: Pt worked on her goal of depression and anxiety. Pt reports minimal depression symptoms and improved self-confidence. Pt said her anxiety is her primary stressor and is related to knowing she will probably have to return to her job at least temporarily until she finds something else. Pt said she has been working on her resume and has plans to seek alternate employment with the support from her partner, [name].      Group Time: 10:30 am - 12:00 pm   Participation Level:  Active  Behavioral Response: Appropriate  Type of Therapy: Psycho-education Group  Summary of Progress: Pt participated in a goodbye ceremony for a member ending group today and practiced the skill of having heathy closure.   Carman ChingENGLEHORN,Yelitza Reach E, [name]

## 2013-08-20 ENCOUNTER — Ambulatory Visit (HOSPITAL_COMMUNITY): Payer: Self-pay | Admitting: Psychiatry

## 2013-08-20 ENCOUNTER — Other Ambulatory Visit (HOSPITAL_COMMUNITY): Payer: 59 | Admitting: Psychiatry

## 2013-08-20 ENCOUNTER — Other Ambulatory Visit (HOSPITAL_COMMUNITY): Payer: 59

## 2013-08-21 ENCOUNTER — Other Ambulatory Visit (HOSPITAL_COMMUNITY): Payer: 59

## 2013-08-21 NOTE — Progress Notes (Signed)
    Daily Group Progress Note  Program: IOP  Group Time: 9:00-10:30 am   Participation Level: Active  Behavioral Response: Appropriate  Type of Therapy:  Process Group  Summary of Progress: pt worked on her goal of depression. Pt cried as she processed anger she does not allow herself to often feel towards her parents for their rejection of her for being gay. Pt also processed job stress. Pt shared her current resume that she has been working on and said she has not started job searching yet, but is planning on starting now that her resume is completed. Pt worries that she won't be able to handle the job stress when she returns to work.      Group Time: 10:30 am - 12:00 pm   Participation Level:  Active  Behavioral Response: Appropriate  Type of Therapy: Psycho-education Group  Summary of Progress: pt learned about the symptoms of depression and how to identify them to prevent and reduce relapses.   Carman ChingENGLEHORN,[name] Ida [name], [name] [name]

## 2013-08-22 ENCOUNTER — Encounter (HOSPITAL_COMMUNITY): Payer: Self-pay

## 2013-08-22 ENCOUNTER — Other Ambulatory Visit (HOSPITAL_COMMUNITY): Payer: 59

## 2013-08-22 ENCOUNTER — Other Ambulatory Visit (HOSPITAL_COMMUNITY): Payer: 59 | Admitting: Psychiatry

## 2013-08-22 DIAGNOSIS — F411 Generalized anxiety disorder: Secondary | ICD-10-CM

## 2013-08-22 DIAGNOSIS — F418 Other specified anxiety disorders: Secondary | ICD-10-CM

## 2013-08-22 NOTE — Patient Instructions (Signed)
Patient completed MH-IOP today.  Will follow up with [name], [name] on 08-25-13 and Dr. Lolly MustacheArfeen on 09-01-13 @ 10 a.m..  Encouraged support groups.  Return to work as per [employer].

## 2013-08-22 NOTE — Progress Notes (Signed)
    Daily Group Progress Note  Program: IOP  Group Time: 9:00-10:30 am   Participation Level: Active  Behavioral Response: Appropriate  Type of Therapy:  Process Group  Summary of Progress: Today was Pts final day in the group. She participated in her goodbye ceremony and reports significant improvement with her depression symptoms and confidence and ability to be more assertive. Pt said she is still not ready to return to work, but knows she will never be ready to return to that job due to high stress associated with it. Pt said she does not plan on returning and is going to put her energy into looking for another job when she feels more stable to being interviewing.      Group Time: 10:30 am - 12:00 pm   Participation Level:  Active  Behavioral Response: Appropriate  Type of Therapy: Psycho-education Group  Summary of Progress: Pt was introduced to the DBT skill of distress tolerance and learned about emotional mind and unhealthy coping skills that lead to creating greater crisis.   Carman ChingENGLEHORN,Hatsuko Bizzarro E, [name]

## 2013-08-22 NOTE — Progress Notes (Signed)
Discharge Note  Patient:  [name] [name] is an 58 y.o., female DOB:  06-22-1956  Date of Admission:  07/22/13  Date of Discharge:  08/22/13  Reason for Admission: Depression and anxiety  Hospital Course: Patient started IOP and was continued on all her admission medications which included Effexor 150 twice a day Ativan 0.5 twice a day and her eyedrops. Her Abilify was increased to 5 mg from 2 mg. Patient tolerated this increase well. One of the major stressors was that patient felt very hopeless and helpless and felt her job was very stressful. The other stressor was that her spouse wanted her to return to her job ASAP. Patient was worried about both DC she was her depression worsened and so her Abilify was increased to 5 mg twice a day. A family meeting was held with the patient's permission with the patient her spouse and Jeri ModenaRita Clark our case manager. Her spouse [name] felt patient was crying more and that the Abilify was not helping and was affecting the memory. She also had some diarrhea and headaches and wanted to Abilify discontinued. Patient's Abilify was discontinued and she was started on Wellbutrin 100 mg a.m. and p.m. Patient began to improve after this and stated that she felt significantly better with improved sleep her panic attacks have decreased she complained of dry mouth but denied suicidal ideation. She stated that her memory and concentration were not up to part she still felt forgetful. Her sleep and appetite were good mood had improved significantly with no suicidal or homicidal ideation and no hallucinations or delusions. She was coping significantly better and was tolerating her medications well and was decided to discharge her.  Mental Status at Discharge: Alert, oriented x3, affect was full mood was stable, speech was normal, no suicidal or homicidal ideation was noted. No hallucinations or delusions are noted. Recent and remote memory was good, judgment and insight were good,  concentration was fair recall was good.  Lab Results: No results found for this or any previous visit (from the past 48 hour(s)).  Current outpatient prescriptions:buPROPion (WELLBUTRIN SR) 100 MG 12 hr tablet, Take 1 tablet (100 mg total) by mouth 2 (two) times daily., Disp: 60 tablet, Rfl: 0;  LORazepam (ATIVAN) 0.5 MG tablet, Take 0.5 mg by mouth 2 (two) times daily., Disp: , Rfl: ;  travoprost, benzalkonium, (TRAVATAN) 0.004 % ophthalmic solution, 1 drop at bedtime., Disp: , Rfl:  venlafaxine XR (EFFEXOR-XR) 150 MG 24 hr capsule, Take 150 mg by mouth 2 (two) times daily. , Disp: , Rfl: ;  zolpidem (AMBIEN) 10 MG tablet, Take 10 mg by mouth at bedtime as needed for sleep., Disp: , Rfl:   Axis Diagnosis:   Axis I: Generalized Anxiety Disorder and Major Depression, Recurrent severe Axis II: Cluster C. traits Axis III:  Past Medical History  Diagnosis Date  . Memory changes   . OSA (obstructive sleep apnea)   . Joint pain   . Deafness     Wears Bilat hearing aids since Childhood  . Obese   . H/O mammogram 07/15/2012  . Pap test, as part of routine gynecological examination     02/25/2010  . Mild cognitive impairment, so stated 07/16/2013    Patient subjectively complains of word finding and memory deficits. MOCA 30-30 on 07-16-13 , paraphrasic errors- uses words such as  closet for garage   . Anxiety   . Depression    Axis IV: occupational problems, other psychosocial or environmental problems, problems related to  social environment and problems with primary support group Axis V: 61-70 mild symptoms   Level of Care:  OP  Discharge destination:  Home  Is patient on multiple antipsychotic therapies at discharge:  No    Has Patient had three or more failed trials of antipsychotic monotherapy by history:  No  Patient phone:  [phone] (home)  Patient address:   [address] [place] [address],   Follow-up recommendations:  Activity:  As tolerated Diet:   Regular Other:  Followup for medications with Dr.ARFEEN and Erasmo Downer for therapy  Comments:    The patient received suicide prevention pamphlet:  Yes   Margit Banda 08/22/2013, 11:14 AM

## 2013-08-22 NOTE — Progress Notes (Signed)
Patient ID: [name] [name], female   DOB: Mar 20, 1956, 58 y.o.   MRN: [id number] D: This is a 58 yr old, Caucasian, same sex married female, who was referred per [name], [name], treatment for ongoing depressive and panic attacks. Pt complained of poor memory, sadness, tearfulness, anxiety, and poor concentration. Denies SI/HI or A/V hallucinations. No previous suicide attempts or gestures. One [facility], then transitioned to MH-IOP. According to pt, her symptoms worsened October 2014. Triggers/Stressors: 1) Spouse had knee surgery 2) Financial Strain 3) Job ([employer]) of nine years. 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 Pt reports that she hasn't been meeting her quotas. Was put on counseling in which, pt has to produce seven completed cases daily. "I hate my job because of all the unethical things they do." Pt reports that she is currently exploring other positions within the company. 4) Unresolved grief/loss issues: Pt's brother committed suicide (hung self) two years ago. He suffered with depression and alcoholism.  Pt completed MH-IOP today.  Reports decreased panic attacks.  Continues to struggle with poor concentration and memory issues.    C/O dry mouth.  Doesn't feel that she is ready to return to work.  Pt was previously scheduled to rtw on 08-25-13, but this writer had requested an extension until patient see Dr. Lolly MustacheArfeen on 09-01-13.  Pt hasn't heard back from [employer] if the request is granted or not.  A:  D/C today.  F/U with Dr. Lolly MustacheArfeen on 09-01-13 @ 10 a.m. And [name], [name] on 08-25-13.  Encouraged support groups.  Return to work as per [employer].  R:  Pt receptive.

## 2013-08-25 ENCOUNTER — Other Ambulatory Visit (HOSPITAL_COMMUNITY): Payer: 59

## 2013-08-26 ENCOUNTER — Other Ambulatory Visit (HOSPITAL_COMMUNITY): Payer: 59

## 2013-08-27 ENCOUNTER — Other Ambulatory Visit (HOSPITAL_COMMUNITY): Payer: 59

## 2013-08-28 ENCOUNTER — Other Ambulatory Visit (HOSPITAL_COMMUNITY): Payer: 59

## 2013-08-29 ENCOUNTER — Other Ambulatory Visit (HOSPITAL_COMMUNITY): Payer: 59

## 2013-09-01 ENCOUNTER — Encounter (HOSPITAL_COMMUNITY): Payer: Self-pay | Admitting: Psychiatry

## 2013-09-01 ENCOUNTER — Ambulatory Visit (INDEPENDENT_AMBULATORY_CARE_PROVIDER_SITE_OTHER): Payer: 59 | Admitting: Psychiatry

## 2013-09-01 ENCOUNTER — Telehealth (HOSPITAL_COMMUNITY): Payer: Self-pay

## 2013-09-01 ENCOUNTER — Other Ambulatory Visit (HOSPITAL_COMMUNITY): Payer: 59

## 2013-09-01 VITALS — BP 132/82 | HR 95 | Ht 61.0 in | Wt 206.0 lb

## 2013-09-01 DIAGNOSIS — F329 Major depressive disorder, single episode, unspecified: Secondary | ICD-10-CM

## 2013-09-01 MED ORDER — BUPROPION HCL ER (SR) 100 MG PO TB12: 100.0000 mg | ORAL_TABLET | Freq: Two times a day (BID) | ORAL | Status: DC

## 2013-09-01 NOTE — Progress Notes (Addendum)
[facility] Initial Assessment Note  [name] [name] [id number] 58 y.o.  09/01/2013 10:57 AM  Chief Complaint:  I still have a lot of anxiety and nervousness but I am feeling better from the past.  History of Present Illness:  Patient is a 58 year old Caucasian, lesbian employed female who is referred from intensive outpatient program for continuation of treatment.  Patient has long history of depression and anxiety symptoms.  In the past few months her depression has been getting worse.  She has a stressful job.  She is working in [employer] for more than 10 years but recently job descriptions has been changed.  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  Recently she has more stress at work.  She's been talking to the patient who are very upset because their doctor has been dropped out from their insurance.  Patient has been experiencing increase in irritability, crying spells, poor sleep and forgetful.  She was given counseling from her supervisor and she was given 3 weeks project improvement because she was not able to meet her numbers.  Another stressor is an anniversary of her brother who committed suicide 2 years ago.  She has unresolved grief issues.  She was concerned about her memory and back in November she saw a neurologist Dr Carilyn Goodpasture because she felt that she has dementia.  Patient reported her tests were normal and she recommended to see a psychiatrist .  She was taking Effexor from Dr. Ann Maki McKinney's office however she felt it was not working.  Recently Abilify was added and she was recommended intensive outpatient program.  Patient is currently on FMLA .  In Intensive outpatient program her Abilify was increased but she developed diarrhea and severe headaches.  It was discontinued and she tried on Wellbutrin.  Patient is taking Effexor 150 mg daily and Wellbutrin SR 100 mg twice a day.  She has also prescribe Ativan 0.5 mg that she  takes only for severe panic attack.  She takes Ambien only she cannot sleep.  She continues to have some crying spells, panic attack, tearfulness, very emotional and irritability.  She still feels some time hopeless but denies any suicidal thoughts or homicidal thoughts.  She denies any side effects of medication.  She supposed to start work on January 18 however she has requested extension and she is waiting for the response.  She does not feel ready to work at this time.  Patient is very concerned about her depression and anxiety symptoms.  She has support from her spouse and friends .  Patient does not use drugs but admitted drinking wine 2-3 times a week.  She denies any binge drinking.  She seems Oda Cogan for counseling and she also decided to get hospice help for grief issues.  She is appointment tonight.  Patient denies any paranoia, hallucination, mania or any suicidal thoughts.  She denies any flashbacks, nightmares or any OCD symptoms.  Patient still has symptoms of depression, chronic feeling of hopelessness, anxiety and decreased energy and crying spells.  She continues to have difficulty organizing her thoughts, poor attention and concentration.  She does not have any tremors or shakes.  Suicidal Ideation: No Plan Formed: No Patient has means to carry out plan: No  Homicidal Ideation: No Plan Formed: No Patient has means to carry out plan: No  Past Psychiatric History/Hospitalization(s) Patient has one psychiatric hospitalization in 2010 when her brother committed suicide.  [facility]  She's been taking antidepressant since 2008.  She had tried Prozac and Paxil .  Prozac made her more manic and Paxil did not help.  She also tried recently Abilify which causes headache and diarrhea.  She denies any history of suicidal attempt but admitted passive suicidal thoughts.  She denies any history of mania, psychosis, depression or any violence.   Anxiety:  Yes Bipolar Disorder: No Depression: Yes Mania: No Psychosis: No Schizophrenia: No Personality Disorder: No Hospitalization for psychiatric illness: Yes History of Electroconvulsive Shock Therapy: No Prior Suicide Attempts: No  Medical History; Patient has hearing impairment, mild cognitive impairment, knee pain, obstructive sleep apnea, joint pain and obesity.  Her primary care physician is [facility] Joss[facility].  Her neurologist is Dr Carilyn Goodpasture.   Traumatic brain injury: Patient denies any history of traumatic brain injury.  Family History; Patient endorsed brother committed suicide 2 years ago.  He had himself.  Education and Work History; The patient has high school education and one year of college.  She is working in [employer] for more than 10 years.  Psychosocial History; Patient born and raised in [place].  She denies any history of physical or sexual abuse.  She has a very supportive and is steady relationship for more than 10 years.  She and her partner married in [place].  Patient parent's live in the town, she usually see them 2-3 times a month however the relationship is still tense because her parents are very religious and does not allow her relationship.  History Of Abuse; Patient denies any history of physical, sexual abuse in the past.  Substance Abuse History; Patient endorsed history of using marijuana many years ago.  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  She admitted drinking wine 2-3 times a week but denies any binge or any intoxication.     Review of Systems: Psychiatric: Agitation: No Hallucination: No Depressed Mood: Yes Insomnia: No Hypersomnia: No Altered Concentration: No Feels Worthless: No Grandiose Ideas: No Belief In Special Powers: No New/Increased Substance Abuse: No Compulsions: No  Neurologic: Headache: No Seizure: No Paresthesias: No    Outpatient Encounter Prescriptions as of 09/01/2013   Medication Sig  . buPROPion (WELLBUTRIN SR) 100 MG 12 hr tablet Take 1 tablet (100 mg total) by mouth 2 (two) times daily.  Marland Kitchen LORazepam (ATIVAN) 0.5 MG tablet Take 0.5 mg by mouth 2 (two) times daily.  . travoprost, benzalkonium, (TRAVATAN) 0.004 % ophthalmic solution 1 drop at bedtime.  Marland Kitchen venlafaxine XR (EFFEXOR-XR) 150 MG 24 hr capsule Take 150 mg by mouth 2 (two) times daily.   Marland Kitchen zolpidem (AMBIEN) 10 MG tablet Take 10 mg by mouth at bedtime as needed for sleep.  . [DISCONTINUED] buPROPion (WELLBUTRIN SR) 100 MG 12 hr tablet Take 1 tablet (100 mg total) by mouth 2 (two) times daily.    Recent Results (from the past 2160 hour(s))  METHYLMALONIC ACID, SERUM     Status: None   Collection Time    07/16/13 10:07 AM      Result Value Range   Methylmalonic Acid 234  0 - 378 nmol/L   Comment:               **Please note reference interval change**  TSH     Status: None   Collection Time    07/16/13 10:07 AM      Result Value Range   TSH 4.160  0.450 - 4.500 uIU/mL  HGB A1C W/O EAG  Status: Abnormal   Collection Time    07/16/13 10:10 AM      Result Value Range   Hemoglobin A1C 5.8 (*) 4.8 - 5.6 %   Comment:          Increased risk for diabetes: 5.7 - 6.4              Diabetes: >6.4              Glycemic control for adults with diabetes: <7.0      Physical Exam: Constitutional:  BP 132/82  Pulse 95  Ht 5\' 1"  (1.549 m)  Wt 206 lb (93.441 kg)  BMI 38.94 kg/m2  Musculoskeletal: Strength & Muscle Tone: within normal limits Gait & Station: normal Patient leans: N/A and The patient has a normal posture  Mental Status Examination;  Patient is well groomed well dressed female who appears to be in her stated age.  She appears very anxious and tearful but cooperative.  She describes her mood as anxious and depressed and her affect is constricted.  She denies any auditory or visual hallucination.  She denies any active or passive suicidal thoughts or homicidal thoughts.  Her speech  is slow but coherent.  There were no flight of ideas or any looseness session.  Her form of knowledge is adequate.  Attention and concentration is distracted.  She has some difficulty organizing her thoughts.  She has no tremors or shakes.  There were no paranoia, delusions or any obsessive thoughts at this time.  She's alert and oriented x3.  Her insight judgment and pulse control is okay.     Medical Decision Making (Choose Three): Established Problem, Stable/Improving (1), New problem, with additional work up planned, Review of Psycho-Social Stressors (1), Decision to obtain old records (1), Review and summation of old records (2), Review of Medication Regimen & Side Effects (2) and Review of New Medication or Change in Dosage (2)  Assessment: Axis I: Maj. depressive disorder, recurrent  Axis II: Deferred  Axis III:  Past Medical History  Diagnosis Date  . Memory changes   . OSA (obstructive sleep apnea)   . Joint pain   . Deafness     Wears Bilat hearing aids since Childhood  . Obese   . H/O mammogram 07/15/2012  . Pap test, as part of routine gynecological examination     02/25/2010  . Mild cognitive impairment, so stated 07/16/2013    Patient subjectively complains of word finding and memory deficits. MOCA 30-30 on 07-16-13 , paraphrasic errors- uses words such as  closet for garage   . Anxiety   . Depression     Axis IV: Mild to moderate   Plan:  I reviewed the chart, collateral information, psychosocial issues in her current medication.  Patient continues to have symptoms of anxiety and depression.  She is taking Wellbutrin 100 mg twice a day and Effexor 150 mg daily.  Patient does not have any side effects.  She is currently on FMLA.  She has asked for extension however she will be notified about the results in 2 days.  I do believe patient should not start work full-time at this time.  I discussed to start half a day for another 2 weeks until her symptoms get better.  She  still has difficulty in concentration, attention and organizing her parts.  I recommend to see therapist for coping and social skills.  We will get records from Dr. pressure in his office.  Followup in 3  weeks.  Patient will inform us about her extension.  And call us back if she has any question or any concern.Time spent 55 minutes.  More than 50% of the time spent in psychoeducation, counseling and coordination of care.  Discuss safety plan that anytime having active suicidal thoughts or homicidal thoughts then patient need to call 911 or go to the local emergency room.    Einar Nolasco T., MD 09/01/2013

## 2013-09-01 NOTE — Telephone Encounter (Signed)
I returned patient's phone call.  She is very concerned about returning to work.  I will extend her FMLA until February 1 and then starting Feb 2nd she will work half-day for another 2 weeks.  Her benefits are managed by [employer] [employer].  I recommend her to inform them that her current psychiatrist is extended her FMLA until February 1 and then starting Feb 2nd, half day for 2 weeks.

## 2013-09-03 ENCOUNTER — Telehealth (HOSPITAL_COMMUNITY): Payer: Self-pay | Admitting: *Deleted

## 2013-09-04 ENCOUNTER — Telehealth: Payer: Self-pay | Admitting: Neurology

## 2013-09-04 ENCOUNTER — Telehealth (HOSPITAL_COMMUNITY): Payer: Self-pay | Admitting: *Deleted

## 2013-09-04 ENCOUNTER — Encounter (HOSPITAL_COMMUNITY): Payer: Self-pay | Admitting: *Deleted

## 2013-09-04 ENCOUNTER — Telehealth (HOSPITAL_COMMUNITY): Payer: Self-pay

## 2013-09-04 NOTE — Telephone Encounter (Signed)
Patient left [id number]:Requests call back from Dr.Arfeen.Needs to pick up letter/note today 1/29 to [employer] stating she is under his care 09/01/13 to 09/06/13,returnig half days on 09/08/13.Needs today.MD will also be receiving phone call to confirm from [employer].

## 2013-09-04 NOTE — Telephone Encounter (Signed)
I spoke to Dr. Cain SaupeShalcross about her work schedule.  Information provided.

## 2013-09-04 NOTE — Telephone Encounter (Signed)
Dr. Gaylyn CheersShallcross would like Dr. Vickey Hugerohmeier to call him back regarding this patient's disability. Please call.

## 2013-09-04 NOTE — Telephone Encounter (Signed)
Pt left [id number]:Needs to know if Dr.Arfeen recv'd fax from [facility] [employer] requesting he write her out of work from 08/25/13 to 09/06/13, and return to work half days beginning 09/08/13.Must be those specific dates or will delay her check.

## 2013-09-04 NOTE — Telephone Encounter (Signed)
I returned patient's phone call.  She needs a letter stating out of work until January 31 and then she would resume her work on Feb 2nd half day for 2 weeks.  We will provide the letter to [facility].  [employer] also send a requested for peer to peer review, we called and left a message to schedule the time.

## 2013-09-05 ENCOUNTER — Telehealth: Payer: Self-pay | Admitting: Neurology

## 2013-09-05 NOTE — Telephone Encounter (Signed)
Calling [employer] regarding patients disability. Would like for Dr. Vickey Hugerohmeier to call him back

## 2013-09-05 NOTE — Telephone Encounter (Signed)
Called Dr. Gaylyn CheersShallcross concerning patient and he wanted to speak with Dr. Vickey Hugerohmeier, I explained to him that Dr. Vickey Hugerohmeier was out of the office until 09/11/13, would he like to speak with the WID, Dr. Gaylyn CheersShallcross stated that he needed to speak with the Dr. Claiborne Billingshat has been working closely with the patient. Dr. Gaylyn CheersShallcross said thank you and that he would take care of it.   FYI

## 2013-09-11 NOTE — Telephone Encounter (Signed)
[name] has been in counseling and psychological care.  I have suggested ( to her and her spouse ) to see a Psychiatrist . The patient had symptoms of severe depression, her memory test was normal.

## 2013-09-18 ENCOUNTER — Encounter (HOSPITAL_COMMUNITY): Payer: Self-pay | Admitting: Psychiatry

## 2013-09-18 ENCOUNTER — Ambulatory Visit (INDEPENDENT_AMBULATORY_CARE_PROVIDER_SITE_OTHER): Payer: 59 | Admitting: Psychiatry

## 2013-09-18 VITALS — BP 130/82 | HR 86 | Wt 207.0 lb

## 2013-09-18 DIAGNOSIS — F329 Major depressive disorder, single episode, unspecified: Secondary | ICD-10-CM

## 2013-09-18 DIAGNOSIS — F339 Major depressive disorder, recurrent, unspecified: Secondary | ICD-10-CM

## 2013-09-18 MED ORDER — LAMOTRIGINE 25 MG PO TABS: ORAL_TABLET | ORAL | Status: DC

## 2013-09-18 MED ORDER — BUPROPION HCL ER (XL) 150 MG PO TB24: 150.0000 mg | ORAL_TABLET | ORAL | Status: DC

## 2013-09-18 MED ORDER — ALPRAZOLAM 0.5 MG PO TABS: 0.5000 mg | ORAL_TABLET | Freq: Every day | ORAL | Status: DC

## 2013-09-18 NOTE — Patient Instructions (Signed)
Reduced Effexor to one a day. Start Lamictal 25 mg daily and than 2 daily after 1 week. Discontinued Lorazepam and try Alprazolam 0.5 mg as needed for anxiety. Increase Wellbutrin to 150 mg daily.

## 2013-09-18 NOTE — Progress Notes (Signed)
[facility] Initial Assessment Note  [name] [name] [id number] 58 y.o.  09/18/2013 4:28 PM  Chief Complaint:  I have a lot of anxiety and nervousness.  I started having panic attack.  History of Present Illness:  [name] [name] came for her followup appointment.  She was seen on January 26 as a initial evaluation when she finished an intensive outpatient program.  She started going to work half a day but she's been experiencing increased anxiety nervousness and having panic attack.  She tried Ativan but it is making her very groggy and sleepy.  She feels very anxious and like to try a different medication.  She had tried Wellbutrin 100 mg which is helping her depression and anxiety symptoms but does not feel it is working very good.  Patient continues to have irritability , anger and depressive symptoms.  She is seeing Pilar Jarvis to talk about grief and loss.  She sleeps on and off.  She continues to have crying spells , social isolation and anhedonia.  The patient continues to have cognitive deficit and sometimes difficulty remembering things.  Her attention and concentration is fair.  She is able to do her ADLs but there are times when she remains very withdrawn, isolated and has no energy to do many things.  She wants to try higher dose of Wellbutrin I like to cut down her Effexor.  In the past she had tried Abilify but it caused diarrhea and significant headaches.  Patient is not drinking or using any illegal substances.  Suicidal Ideation: No Plan Formed: No Patient has means to carry out plan: No  Homicidal Ideation: No Plan Formed: No Patient has means to carry out plan: No  Past Psychiatric History/Hospitalization(s) Patient has one psychiatric hospitalization in 2010 when her brother committed suicide.  [facility]  She's been taking antidepressant since 2008.  She had tried Prozac and Paxil .  Prozac made her more manic and Paxil did not help.  She  also tried recently Abilify which causes headache and diarrhea.  She denies any history of suicidal attempt but admitted passive suicidal thoughts.  She denies any history of mania, psychosis, depression or any violence.   Anxiety: Yes Bipolar Disorder: No Depression: Yes Mania: No Psychosis: No Schizophrenia: No Personality Disorder: No Hospitalization for psychiatric illness: Yes History of Electroconvulsive Shock Therapy: No Prior Suicide Attempts: No  Medical History; Patient has hearing impairment, mild cognitive impairment, knee pain, obstructive sleep apnea, joint pain and obesity.  Her primary care physician is Dr. Jossie Ng at [facility].  Her neurologist is Dr Carilyn Goodpasture.   Psychosocial History; Patient born and raised in [place].  She denies any history of physical or sexual abuse.  She has a very supportive and is steady relationship for more than 10 years.  She and her partner married in [place].  Patient parent's live in the town, she usually see them 2-3 times a month however the relationship is still tense because her parents are very religious and does not allow her relationship.  Review of Systems: Psychiatric: Agitation: No Hallucination: No Depressed Mood: Yes Insomnia: No Hypersomnia: No Altered Concentration: No Feels Worthless: No Grandiose Ideas: No Belief In Special Powers: No New/Increased Substance Abuse: No Compulsions: No  Neurologic: Headache: No Seizure: No Paresthesias: No    Outpatient Encounter Prescriptions as of 09/18/2013  Medication Sig  . travoprost, benzalkonium, (TRAVATAN) 0.004 % ophthalmic solution 1 drop at bedtime.  Marland Kitchen venlafaxine XR (EFFEXOR-XR) 150  MG 24 hr capsule Take 150 mg by mouth daily with breakfast.  . zolpidem (AMBIEN) 10 MG tablet Take 10 mg by mouth at bedtime as needed for sleep.  . [DISCONTINUED] buPROPion (WELLBUTRIN SR) 100 MG 12 hr tablet Take 1 tablet (100 mg total) by mouth 2 (two) times daily.  .  [DISCONTINUED] LORazepam (ATIVAN) 0.5 MG tablet Take 0.5 mg by mouth 2 (two) times daily.  . [DISCONTINUED] venlafaxine XR (EFFEXOR-XR) 150 MG 24 hr capsule Take 150 mg by mouth 2 (two) times daily.   Marland Kitchen. ALPRAZolam (XANAX) 0.5 MG tablet Take 1 tablet (0.5 mg total) by mouth daily.  Marland Kitchen. buPROPion (WELLBUTRIN XL) 150 MG 24 hr tablet Take 1 tablet (150 mg total) by mouth every morning.  . lamoTRIgine (LAMICTAL) 25 MG tablet Take 1 tab daily for 1 week and than 2 daily    Recent Results (from the past 2160 hour(s))  METHYLMALONIC ACID, SERUM     Status: None   Collection Time    07/16/13 10:07 AM      Result Value Ref Range   Methylmalonic Acid 234  0 - 378 nmol/L   Comment:               **Please note reference interval change**  TSH     Status: None   Collection Time    07/16/13 10:07 AM      Result Value Ref Range   TSH 4.160  0.450 - 4.500 uIU/mL  HGB A1C W/O EAG     Status: Abnormal   Collection Time    07/16/13 10:10 AM      Result Value Ref Range   Hemoglobin A1C 5.8 (*) 4.8 - 5.6 %   Comment:          Increased risk for diabetes: 5.7 - 6.4              Diabetes: >6.4              Glycemic control for adults with diabetes: <7.0      Physical Exam: Constitutional:  BP 130/82  Pulse 86  Wt 207 lb (93.895 kg)  Musculoskeletal: Strength & Muscle Tone: within normal limits Gait & Station: normal Patient leans: N/A and The patient has a normal posture  Mental Status Examination;  Patient is well groomed well dressed female who appears to be in her stated age.  She appears anxious but cooperative.  She describes her mood as anxious and depressed and her affect is constricted.  She denies any auditory or visual hallucination.  She denies any active or passive suicidal thoughts or homicidal thoughts.  Her speech is slow but coherent.  There were no flight of ideas or any looseness session.  Her form of knowledge is adequate.  Her attention and concentration is distracted.  She has  some difficulty organizing her thoughts.  She has no tremors or shakes.  There were no paranoia, delusions or any obsessive thoughts at this time.  She's alert and oriented x3.  Her insight judgment and pulse control is okay.     Medical Decision Making (Choose Three): Established Problem, Stable/Improving (1), Review of Psycho-Social Stressors (1), Review and summation of old records (2), Established Problem, Worsening (2), Review of Medication Regimen & Side Effects (2) and Review of New Medication or Change in Dosage (2)  Assessment: Axis I: Maj. depressive disorder, recurrent  Axis II: Deferred  Axis III:  Past Medical History  Diagnosis Date  . Memory changes   .  OSA (obstructive sleep apnea)   . Joint pain   . Deafness     Wears Bilat hearing aids since Childhood  . Obese   . H/O mammogram 07/15/2012  . Pap test, as part of routine gynecological examination     02/25/2010  . Mild cognitive impairment, so stated 07/16/2013    Patient subjectively complains of word finding and memory deficits. MOCA 30-30 on 07-16-13 , paraphrasic errors- uses words such as  closet for garage   . Anxiety   . Depression     Axis IV: Mild to moderate   Plan:  I will increase her Wellbutrin to 150 mg daily.  Patient wants to reduce her Effexor .  I will decrease to 150 mg a day.  I will discontinue Ativan and recommend to try Xanax 0.5 mg as needed for severe anxiety and panic attack.  I will also add Lamictal to help her mood lability and residual symptoms of depression .  We will start with 5 mg daily for one week and then increase to 50 mg daily.  I have a long discussion with the patient about the side effects especially rash in that case she needed to stop the medication immediately.  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  I recommended that she started to have more panic attacks and she should call us .  Followup in 3 weeks.  Recommended to call us back if she has any  question or any concern.Time spent 25 minutes.  More than 50% of the time spent in psychoeducation, counseling and coordination of care.  Discuss safety plan that anytime having active suicidal thoughts or homicidal thoughts then patient need to call 911 or go to the local emergency room.    ARFEEN,SYED T., MD 09/18/2013

## 2013-09-22 ENCOUNTER — Ambulatory Visit (HOSPITAL_COMMUNITY): Payer: Self-pay | Admitting: Psychiatry

## 2013-10-13 ENCOUNTER — Ambulatory Visit (HOSPITAL_COMMUNITY): Payer: Self-pay | Admitting: Psychiatry

## 2013-10-15 ENCOUNTER — Other Ambulatory Visit (HOSPITAL_COMMUNITY): Payer: Self-pay | Admitting: Psychiatry

## 2013-10-15 DIAGNOSIS — F329 Major depressive disorder, single episode, unspecified: Secondary | ICD-10-CM

## 2013-10-18 ENCOUNTER — Other Ambulatory Visit (HOSPITAL_COMMUNITY): Payer: Self-pay | Admitting: Psychiatry

## 2013-10-20 ENCOUNTER — Encounter (HOSPITAL_COMMUNITY): Payer: Self-pay | Admitting: Psychiatry

## 2013-10-20 ENCOUNTER — Ambulatory Visit (INDEPENDENT_AMBULATORY_CARE_PROVIDER_SITE_OTHER): Payer: 59 | Admitting: Psychiatry

## 2013-10-20 VITALS — BP 128/80 | HR 72 | Ht 60.0 in | Wt 206.2 lb

## 2013-10-20 DIAGNOSIS — F339 Major depressive disorder, recurrent, unspecified: Secondary | ICD-10-CM

## 2013-10-20 DIAGNOSIS — F329 Major depressive disorder, single episode, unspecified: Secondary | ICD-10-CM

## 2013-10-20 MED ORDER — LAMOTRIGINE 100 MG PO TABS: 100.0000 mg | ORAL_TABLET | Freq: Every day | ORAL | Status: DC

## 2013-10-20 MED ORDER — BUPROPION HCL ER (XL) 150 MG PO TB24: ORAL_TABLET | ORAL | Status: DC

## 2013-10-20 NOTE — Progress Notes (Signed)
[facility] [id number] Progress note   [name] [name] [id number] 58 y.o.  10/20/2013 9:39 AM  Chief Complaint:  I feel better .  I cut down my Ambien and Xanax.    History of Present Illness:  [name] [name] came for her followup appointment.  She was started on Lamictal , Xanax and Wellbutrin on her last visit.  She liked the new medication.  She is more social and active.  She denies any recent panic attack.  She denies any itching or rash with Lamictal.  Her energy level is improved from the past.  She endorsed some anxiety but denies any crying spells, insomnia or any agitation.  She has noticed less need of Ambien and Xanax .  However she accidentally missed the dose of Effexor for 2 days and she complained of social isolation, crying spells and withdrawn.  She expressed concern about reducing the Effexor at this time because of those symptoms .  She is working however she continues to have challenges at work.  Overall her job performance is improved from the past. She is seeing [name] [name] to talk about grief and loss.  She is sleeping better.  Her appetite and weight is unchanged from the past.  She is scheduled to have knee surgery in April.  She is also taking hydrocodone for pain as needed.  She is not drinking or using any illegal substances.  Suicidal Ideation: No Plan Formed: No Patient has means to carry out plan: No  Homicidal Ideation: No Plan Formed: No Patient has means to carry out plan: No  Past Psychiatric History/Hospitalization(s) Patient has one psychiatric hospitalization in 2010 when her brother committed suicide.  [facility]  She's been taking antidepressant since 2008.  She had tried Prozac and Paxil .  Prozac made her more manic and Paxil did not help.  She also tried recently Abilify which causes headache and diarrhea.  She denies any history of suicidal attempt but admitted passive suicidal thoughts.  She denies any history of  mania, psychosis, depression or any violence.   Anxiety: Yes Bipolar Disorder: No Depression: Yes Mania: No Psychosis: No Schizophrenia: No Personality Disorder: No Hospitalization for psychiatric illness: Yes History of Electroconvulsive Shock Therapy: No Prior Suicide Attempts: No  Medical History; Patient has hearing impairment, mild cognitive impairment, knee pain, obstructive sleep apnea, joint pain and obesity.  Her primary care physician is Dr. Jossie Ng at [facility].  Her neurologist is Dr Carilyn Goodpasture.   Psychosocial History; Patient born and raised in [place].  She denies any history of physical or sexual abuse.  She has a very supportive and is steady relationship for more than 10 years.  She and her partner married in [place].  Patient parent's live in the town, she usually see them 2-3 times a month however the relationship is still tense because her parents are very religious and does not allow her relationship.  Review of Systems: Psychiatric: Agitation: No Hallucination: No Depressed Mood: No Insomnia: No Hypersomnia: No Altered Concentration: No Feels Worthless: No Grandiose Ideas: No Belief In Special Powers: No New/Increased Substance Abuse: No Compulsions: No  Neurologic: Headache: No Seizure: No Paresthesias: No Review of Systems  HENT: Positive for hearing loss.   Musculoskeletal: Positive for joint pain.      Outpatient Encounter Prescriptions as of 10/20/2013  Medication Sig  . ALPRAZolam (XANAX) 0.5 MG tablet Take 1 tablet (0.5 mg total) by mouth daily.  Marland Kitchen buPROPion (WELLBUTRIN XL) 150  MG 24 hr tablet TAKE 1 TABLET BY MOUTH EVERY MORNING  . lamoTRIgine (LAMICTAL) 100 MG tablet Take 1 tablet (100 mg total) by mouth daily.  . travoprost, benzalkonium, (TRAVATAN) 0.004 % ophthalmic solution 1 drop at bedtime.  Marland Kitchen. venlafaxine XR (EFFEXOR-XR) 150 MG 24 hr capsule Take 150 mg by mouth daily with breakfast.  . zolpidem (AMBIEN) 10 MG tablet  Take 10 mg by mouth at bedtime as needed for sleep.  . [DISCONTINUED] buPROPion (WELLBUTRIN XL) 150 MG 24 hr tablet TAKE 1 TABLET BY MOUTH EVERY MORNING  . [DISCONTINUED] lamoTRIgine (LAMICTAL) 25 MG tablet Take 1 tab daily for 1 week and than 2 daily    No results found for this or any previous visit (from the past 2160 hour(s)).    Physical Exam: Constitutional:  BP 128/80  Pulse 72  Ht 5' (1.524 m)  Wt 206 lb 3.2 oz (93.532 kg)  BMI 40.27 kg/m2  Musculoskeletal: Strength & Muscle Tone: within normal limits Gait & Station: normal Patient leans: N/A and The patient has a normal posture  Mental Status Examination;  Patient is well groomed well dressed female who appears to be in her stated age.  She is anxious but cooperative.  Her speech is slow but clear and coherent.  She described her mood is anxious and her affect is constricted.  She denies any auditory or visual hallucination.  She denies any active or passive suicidal thoughts or homicidal thoughts. There were no flight of ideas or any looseness session.  Her form of knowledge is adequate.  Her attention and concentration is fair.  She has some difficulty organizing her thoughts.  She has no tremors or shakes.  There were no paranoia, delusions or any obsessive thoughts at this time.  She's alert and oriented x3.  Her insight judgment and pulse control is okay.     Established Problem, Stable/Improving (1), Review of Psycho-Social Stressors (1), Decision to obtain old records (1), Review of Last Therapy Session (1), Review of Medication Regimen & Side Effects (2) and Review of New Medication or Change in Dosage (2)  Assessment: Axis I: Maj. depressive disorder, recurrent  Axis II: Deferred  Axis III:  Past Medical History  Diagnosis Date  . Memory changes   . OSA (obstructive sleep apnea)   . Joint pain   . Deafness     Wears Bilat hearing aids since Childhood  . Obese   . H/O mammogram 07/15/2012  . Pap test, as  part of routine gynecological examination     02/25/2010  . Mild cognitive impairment, so stated 07/16/2013    Patient subjectively complains of word finding and memory deficits. MOCA 30-30 on 07-16-13 , paraphrasic errors- uses words such as  closet for garage   . Anxiety   . Depression     Axis IV: Mild to moderate   Plan:  Patient is doing better on recent changes.  She has cut down her Xanax and Ambien.  She liked the Lamictal.  She does not have any rash or itching.  Patient scheduled to have knee surgery in April.  At this time I will continue current dose of Wellbutrin and Effexor.  Patient does not want to reduce the Effexor at this time.  She feels current combination is working very well.  However I will increase Lamictal 100 mg daily.  In the future we will gradually reduce the Effexor and increase to Wellbutrin.  Patient still has refills remaining on Xanax and Ambien.  Recommended to see therapist for coping and social skills.  She is also taking hydrocodone and I explained that she should take that medication only for severe pain.  Discuss narcotic abuse and dependency.  I I will see her again in 4 weeks. Recommended to call us back if she has any question or any concern.Time spent 25 minutes.  More than 50% of the time spent in psychoeducation, counseling and coordination of care.  Discuss safety plan that anytime having active suicidal thoughts or homicidal thoughts then patient need to call 911 or go to the local emergency room.    [name] [name] T., MD 10/20/2013

## 2013-10-30 ENCOUNTER — Other Ambulatory Visit: Payer: Self-pay | Admitting: Orthopedic Surgery

## 2013-11-05 ENCOUNTER — Other Ambulatory Visit: Payer: Self-pay | Admitting: Orthopedic Surgery

## 2013-11-12 ENCOUNTER — Encounter (HOSPITAL_COMMUNITY): Payer: Self-pay | Admitting: Pharmacy Technician

## 2013-11-16 ENCOUNTER — Other Ambulatory Visit (HOSPITAL_COMMUNITY): Payer: Self-pay | Admitting: Psychiatry

## 2013-11-17 NOTE — Patient Instructions (Addendum)
[name] [name]  11/17/2013                           YOUR PROCEDURE IS SCHEDULED ON: 11/21/13               PLEASE REPORT TO SHORT STAY CENTER AT : 12:15 PM               CALL THIS NUMBER IF ANY PROBLEMS THE DAY OF SURGERY :               [phone]                                REMEMBER:   Do not eat food or drink liquids AFTER MIDNIGHT   May have clear liquids UNTIL 6 HOURS BEFORE SURGERY (9:15 AM)               Take these medicines the morning of surgery with A SIP OF WATER:  WELLBUTRIN / LAMICTAL / EFFEXOR / HYDROCODONE IF NEEDED   Do not wear jewelry, make-up   Do not wear lotions, powders, or perfumes.   Do not shave legs or underarms 12 hrs. before surgery (men may shave face)  Do not bring valuables to the hospital.  Contacts, dentures or bridgework may not be worn into surgery.  Leave suitcase in the car. After surgery it may be brought to your room.  For patients admitted to the hospital more than one night, checkout time is            11:00 AM                                                       [facility] INFORMATION SHEET               BRING BIPAP MASK AND TUBING TO HOSPITAL                               [facility] - Preparing for Surgery Before surgery, you can play an important role.  Because skin is not sterile, your skin needs to be as free of germs as possible.  You can reduce the number of germs on your skin by washing with CHG (chlorahexidine gluconate) soap before surgery.  CHG is an antiseptic cleaner which kills germs and bonds with the skin to continue killing germs even after washing. Please DO NOT use if you have an allergy to CHG or antibacterial soaps.  If your skin becomes reddened/irritated stop using the CHG and inform your nurse when you arrive at Short Stay. Do not shave (including legs and underarms) for at least 48 hours prior to the first CHG shower.  You may shave your face. Please follow these instructions carefully:  1.  Shower with  CHG Soap the night before surgery and the  morning of Surgery.   2.  If you choose to wash your hair, wash your hair first as usual with your  normal  Shampoo.   3.  After you shampoo, rinse your hair and body thoroughly to remove the  shampoo.  4.  Use CHG as you would any other liquid soap.  You can apply chg directly  to the skin and wash . Gently wash with scrungie or clean wascloth    5.  Apply the CHG Soap to your body ONLY FROM THE NECK DOWN.   Do not use on open                           Wound or open sores. Avoid contact with eyes, ears mouth and genitals (private parts).                        Genitals (private parts) with your normal soap.              6.  Wash thoroughly, paying special attention to the area where your surgery  will be performed.   7.  Thoroughly rinse your body with warm water from the neck down.   8.  DO NOT shower/wash with your normal soap after using and rinsing off  the CHG Soap .                9.  Pat yourself dry with a clean towel.             10.  Wear clean pajamas.             11.  Place clean sheets on your bed the night of your first shower and do not  sleep with pets.  Day of Surgery : Do not apply any lotions/deodorants the morning of surgery.  Please wear clean clothes to the hospital/surgery center.  FAILURE TO FOLLOW THESE INSTRUCTIONS MAY RESULT IN THE CANCELLATION OF YOUR SURGERY    PATIENT SIGNATURE_________________________________    Incentive Spirometer  An incentive spirometer is a tool that can help keep your lungs clear and active. This tool measures how well you are filling your lungs with each breath. Taking long deep breaths may help reverse or decrease the chance of developing breathing (pulmonary) problems (especially infection) following:  A long period of time when you are unable to move or be active. BEFORE THE PROCEDURE   If the spirometer includes an indicator to show  your best effort, your nurse or respiratory therapist will set it to a desired goal.  If possible, sit up straight or lean slightly forward. Try not to slouch.  Hold the incentive spirometer in an upright position. INSTRUCTIONS FOR USE  1. Sit on the edge of your bed if possible, or sit up as far as you can in bed or on a chair. 2. Hold the incentive spirometer in an upright position. 3. Breathe out normally. 4. Place the mouthpiece in your mouth and seal your lips tightly around it. 5. Breathe in slowly and as deeply as possible, raising the piston or the ball toward the top of the column. 6. Hold your breath for 3-5 seconds or for as long as possible. Allow the piston or ball to fall to the bottom of the column. 7. Remove the mouthpiece from your mouth and breathe out normally. 8. Rest for a few seconds and repeat Steps 1 through 7 at least 10 times every 1-2 hours when you are awake. Take your time and take a few normal breaths between deep breaths. 9. The spirometer may include an indicator to show your best effort. Use the indicator as a goal to work toward during each repetition. 10.  After each set of 10 deep breaths, practice coughing to be sure your lungs are clear. If you have an incision (the cut made at the time of surgery), support your incision when coughing by placing a pillow or rolled up towels firmly against it. Once you are able to get out of bed, walk around indoors and cough well. You may stop using the incentive spirometer when instructed by your caregiver.  RISKS AND COMPLICATIONS  Take your time so you do not get dizzy or light-headed.  If you are in pain, you may need to take or ask for pain medication before doing incentive spirometry. It is harder to take a deep breath if you are having pain. AFTER USE  Rest and breathe slowly and easily.  It can be helpful to keep track of a log of your progress. Your caregiver can provide you with a simple table to help with  this. If you are using the spirometer at home, follow these instructions: SEEK MEDICAL CARE IF:   You are having difficultly using the spirometer.  You have trouble using the spirometer as often as instructed.  Your pain medication is not giving enough relief while using the spirometer.  You develop fever of 100.5 F (38.1 C) or higher. SEEK IMMEDIATE MEDICAL CARE IF:   You cough up bloody sputum that had not been present before.  You develop fever of 102 F (38.9 C) or greater.  You develop worsening pain at or near the incision site. MAKE SURE YOU:   Understand these instructions.  Will watch your condition.  Will get help right away if you are not doing well or get worse. Document Released: 12/04/2006 Document Revised: 10/16/2011 Document Reviewed: 02/04/2007 [facility] Patient Information 2014 [place], [place].     CLEAR LIQUID DIET   Foods Allowed                                                                     Foods Excluded  Coffee and tea, regular and decaf                             liquids that you cannot  Plain Jell-O in any flavor                                             see through such as: Fruit ices (not with fruit pulp)                                     milk, soups, orange juice  Iced Popsicles                                    All solid food Carbonated beverages, regular and diet  Cranberry, grape and apple juices Sports drinks like Gatorade Lightly seasoned clear broth or consume(fat free) Sugar, honey syrup  Sample Menu Breakfast                                Lunch                                     Supper Cranberry juice                    Beef broth                            Chicken broth Jell-O                                     Grape juice                           Apple juice Coffee or tea                        Jell-O                                      Popsicle                                                 Coffee or tea                        Coffee or tea

## 2013-11-18 ENCOUNTER — Encounter (HOSPITAL_COMMUNITY): Payer: Self-pay

## 2013-11-18 ENCOUNTER — Encounter (HOSPITAL_COMMUNITY)
Admission: RE | Admit: 2013-11-18 | Discharge: 2013-11-18 | Disposition: A | Discharge status: Home / Self Care | Payer: 59 | Source: Ambulatory Visit | Attending: Orthopedic Surgery | Admitting: Orthopedic Surgery

## 2013-11-18 HISTORY — DX: Hyperlipidemia, unspecified: E78.5

## 2013-11-18 LAB — URINALYSIS, ROUTINE W REFLEX MICROSCOPIC
Bilirubin Urine: NEGATIVE
GLUCOSE, UA: NEGATIVE mg/dL
Hgb urine dipstick: NEGATIVE
Ketones, ur: NEGATIVE mg/dL
LEUKOCYTES UA: NEGATIVE
NITRITE: NEGATIVE
PH: 6.5 (ref 5.0–8.0)
Protein, ur: NEGATIVE mg/dL
Specific Gravity, Urine: 1.007 (ref 1.005–1.030)
Urobilinogen, UA: 0.2 mg/dL (ref 0.0–1.0)

## 2013-11-18 LAB — COMPREHENSIVE METABOLIC PANEL WITH GFR
ALT: 27 U/L (ref 0–35)
AST: 25 U/L (ref 0–37)
Albumin: 4 g/dL (ref 3.5–5.2)
Alkaline Phosphatase: 55 U/L (ref 39–117)
BUN: 13 mg/dL (ref 6–23)
CO2: 29 meq/L (ref 19–32)
Calcium: 10.1 mg/dL (ref 8.4–10.5)
Chloride: 101 meq/L (ref 96–112)
Creatinine, Ser: 0.66 mg/dL (ref 0.50–1.10)
GFR calc Af Amer: >90 mL/min (ref >90)
GFR calc non Af Amer: >90 mL/min (ref >90)
Glucose, Bld: 92 mg/dL (ref 70–99)
Potassium: 5.1 meq/L (ref 3.7–5.3)
Sodium: 141 meq/L (ref 137–147)
Total Bilirubin: 0.3 mg/dL (ref 0.3–1.2)
Total Protein: 7.6 g/dL (ref 6.0–8.3)

## 2013-11-18 LAB — CBC
HCT: 37.8 % (ref 36.0–46.0)
Hemoglobin: 12.5 g/dL (ref 12.0–15.0)
MCH: 32.1 pg (ref 26.0–34.0)
MCHC: 33.1 g/dL (ref 30.0–36.0)
MCV: 96.9 fL (ref 78.0–100.0)
Platelets: 206 10*3/uL (ref 150–400)
RBC: 3.9 MIL/uL (ref 3.87–5.11)
RDW: 12.2 % (ref 11.5–15.5)
WBC: 6.1 10*3/uL (ref 4.0–10.5)

## 2013-11-18 LAB — SURGICAL PCR SCREEN
MRSA, PCR: NEGATIVE
STAPHYLOCOCCUS AUREUS: POSITIVE — AB

## 2013-11-18 LAB — APTT: APTT: 30 s (ref 24–37)

## 2013-11-18 LAB — PROTIME-INR
INR: 0.97 (ref 0.00–1.49)
Prothrombin Time: 12.7 s (ref 11.6–15.2)

## 2013-11-18 NOTE — Progress Notes (Signed)
[phone] Pt notified of result of PCR Result faxed to Dr. Lequita HaltAluisio

## 2013-11-20 ENCOUNTER — Ambulatory Visit (HOSPITAL_COMMUNITY): Payer: Self-pay | Admitting: Psychiatry

## 2013-11-20 ENCOUNTER — Other Ambulatory Visit (HOSPITAL_COMMUNITY): Payer: Self-pay | Admitting: Psychiatry

## 2013-11-20 ENCOUNTER — Other Ambulatory Visit: Payer: Self-pay | Admitting: Orthopedic Surgery

## 2013-11-20 DIAGNOSIS — F329 Major depressive disorder, single episode, unspecified: Secondary | ICD-10-CM

## 2013-11-20 NOTE — H&P (Signed)
[name] [name] DOB: [birth year] Married / Language: English / Race: White Female  Date of Admission:  4-17- 2015  Chief Complaint:  Left Knee Instability  History of Present Illness The patient is a 58 year old female who comes in for a preoperative History and Physical. The patient is scheduled for a left total knee arthroplasty (revision) to be performed by Dr. Gus RankinFrank V. Aluisio, MD at [facility] on 11/21/2013. The patient is a 58 year old female who presents for follow up of their knee. The patient is being followed for their left knee pain. They are now 7 year(s) out from bilateral knee pain. Symptoms reported today include: pain and instability. The patient feels that they are doing poorly and report their pain level to be moderate. Current treatment includes: bracing and water exercise. The following medication has been used for pain control: Tylenol with codeine (has not really helped much).  [name] [name] has had some problems with the left knee for several months which brought her in for eval. The knee has been buckling on her at times and causing some pain and discomfort. She describes the pain on the anterior portion of her knee and the inside of the knee. It was felt that she had some instability of her left knee and was placed into an economy hinge brace. She was also setup for some outpatient therapy. She ahs been using the brace and can tell a differnce in her stability and pain. She still will have pain at night at times though. She denies any infectious or constitutional symptoms with the onset of the knee pain. She did not go to outpatient since she goes to her own gym three days a week and works with a Psychologist, educationaltrainer. It is of note that she has lost 40 pounds already. The patient does have the popping still in her left knee. She has soreness that the physical therapy did not help. She feels like the brace does help at times. They have been treated conservatively in the  past for the above stated problem and despite conservative measures, they continue to have progressive pain and severe functional limitations and dysfunction. They have failed non-operative management including home exercise, medications, and bracing. It is felt that they would benefit from undergoing polyethelene revision. Risks and benefits of the procedure have been discussed with the patient and they elect to proceed with surgery. There are no active contraindications to surgery such as ongoing infection or rapidly progressive neurological disease.   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 Brother. Osteoarthritis. Maternal Grandmother, Mother. Depression. Father, Mother. Congestive Heart Failure. Paternal Grandfather. Heart Disease. Paternal Grandfather. Drug / Alcohol Addiction. Brother, Mother. Chronic Obstructive Lung Disease. Maternal Grandmother.    Social History Tobacco / smoke exposure. 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 working full time Tobacco use. Former smoker. 06/17/2013: smoke(d) 1 1/2 pack(s) per day uses less than 1/2 can(s) smokeless per week Not under pain contract No history of drug/alcohol rehab Number of flights of stairs before winded. 2-3 Current drinker. 06/17/2013: Currently drinks wine less than 5 times per week Exercise. Exercises weekly; does other and gym / weights Marital status. married Living situation. live with spouse Post-Surgical Plans. Home Advance Directives. Living Will, Healthcare POA    Medication History BuPROPion HCl ER (SR) (100MG  Tablet ER 12HR,  Oral) Active. Effexor XR (150MG  Capsule ER 24HR, Oral) Active. Acetaminophen-Codeine #3 (300-30MG  Tablet, Oral) Active. Travatan Z  (0.004% Solution, Ophthalmic) Active. CoQ-10 ( Oral) Specific dose unknown - Active. B Complex ( Oral) Active. Vitamin C ( Oral) Specific dose unknown - Active. Fish Oil Active. Osteo Bi-Flex Regular Strength ( Oral) Specific dose unknown - Active. Glucosamine Chondroitin Complx ( Oral) Active. Medications Reconciled.   Past Surgical History Foot Surgery. right Hysterectomy. Date: 09/2005. complete (non-cancerous) Total Knee Replacement. bilateral Arthroscopy of Knee. left    Past Medical History Depression Anxiety Disorder Sleep Apnea. BiPAP Hypercholesterolemia Heart murmur Other disease, cancer, significant illness. Hearing loss Osteoporosis Glaucoma Impaired Vision Impaired Hearing Impaired Memory    Review of Systems General:Not Present- Chills, Fever, Night Sweats, Fatigue, Weight Gain, Weight Loss and Memory Loss. Skin:Not Present- Hives, Itching, Rash, Eczema and Lesions. HEENT:Not Present- Tinnitus, Headache, Double Vision, Visual Loss, Hearing Loss and Dentures. Respiratory:Not Present- Shortness of breath with exertion, Shortness of breath at rest, Allergies, Coughing up blood and Chronic Cough. Cardiovascular:Not Present- Chest Pain, Racing/skipping heartbeats, Difficulty Breathing Lying Down, Murmur, Swelling and Palpitations. Gastrointestinal:Not Present- Bloody Stool, Heartburn, Abdominal Pain, Vomiting, Nausea, Constipation, Diarrhea, Difficulty Swallowing, Jaundice and Loss of appetitie. Female Genitourinary:Not Present- Blood in Urine, Urinary frequency, Weak urinary stream, Discharge, Flank Pain, Incontinence, Painful Urination, Urgency, Urinary Retention and Urinating at Night. Musculoskeletal:Not Present- Muscle Weakness, Muscle Pain, Joint Swelling, Joint Pain, Back Pain, Morning Stiffness and Spasms. Neurological:Not Present- Tremor, Dizziness, Blackout spells, Paralysis, Difficulty with balance and Weakness. Psychiatric:Not  Present- Insomnia.    Vitals Weight: 198 lb Height: 61 in Weight was reported by patient. Height was reported by patient. Body Surface Area: 1.97 m Body Mass Index: 37.41 kg/m Pulse: 88 (Regular) Resp.: 16 (Unlabored) BP: 116/80 (Sitting, Left Arm, Standard)     Physical Exam The physical exam findings are as follows:   General Mental Status - Alert, cooperative and good historian. General Appearance- pleasant. Not in acute distress. Orientation- Oriented X3. Build & Nutrition- Well nourished and Well developed.   Head and Neck Head- normocephalic, atraumatic . Neck Global Assessment- supple. no bruit auscultated on the right and no bruit auscultated on the left.   Eye Vision- Wears corrective lenses. Pupil- Bilateral- Regular and Round. Motion- Bilateral- EOMI.   ENMT  bilateral hearing aids  Chest and Lung Exam Auscultation: Breath sounds:- clear at anterior chest wall and - clear at posterior chest wall. Adventitious sounds:- No Adventitious sounds.   Cardiovascular Auscultation:Rhythm- Regular rate and rhythm. Heart Sounds- S1 WNL and S2 WNL. Murmurs & Other Heart Sounds:Auscultation of the heart reveals - No Murmurs.   Abdomen Palpation/Percussion:Tenderness- Abdomen is non-tender to palpation. Rigidity (guarding)- Abdomen is soft. Auscultation:Auscultation of the abdomen reveals - Bowel sounds normal.   Female Genitourinary   Note: Not done, not pertinent to present illness  Musculoskeletal   Note: On exam she is in no distress. Her left knee shows no effusion. Her range is about 0 to 130. There is crepitus on range of motion. She does have some AP laxity in flexion as well as a little hyperextension.   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 Lequita HaltAluisio.  Plan is to go home.  PCP - Dr. Everlene OtherBouska - Patient  has been seen preoperatively and felt to be stable for surgery.  The patient does not have any contraindications and will receive TXA (tranexamic acid) prior to surgery.  Signed electronically by Joelene Millin, III PA-C

## 2013-11-21 ENCOUNTER — Encounter (HOSPITAL_COMMUNITY)
Admission: RE | Disposition: A | Discharge status: Home Health | Payer: Self-pay | Source: Ambulatory Visit | Attending: Orthopedic Surgery

## 2013-11-21 ENCOUNTER — Encounter (HOSPITAL_COMMUNITY): Payer: Self-pay | Admitting: *Deleted

## 2013-11-21 ENCOUNTER — Inpatient Hospital Stay (HOSPITAL_COMMUNITY)
Admission: RE | Admit: 2013-11-21 | Discharge: 2013-11-23 | DRG: 467 | Disposition: A | Discharge status: Home Health | Payer: 59 | Source: Ambulatory Visit | Attending: Orthopedic Surgery | Admitting: Orthopedic Surgery

## 2013-11-21 ENCOUNTER — Ambulatory Visit (HOSPITAL_COMMUNITY): Payer: 59 | Admitting: Anesthesiology

## 2013-11-21 ENCOUNTER — Encounter (HOSPITAL_COMMUNITY): Payer: 59 | Admitting: Anesthesiology

## 2013-11-21 DIAGNOSIS — Z79899 Other long term (current) drug therapy: Secondary | ICD-10-CM

## 2013-11-21 DIAGNOSIS — F341 Dysthymic disorder: Secondary | ICD-10-CM | POA: Diagnosis present

## 2013-11-21 DIAGNOSIS — G3184 Mild cognitive impairment, so stated: Secondary | ICD-10-CM | POA: Diagnosis present

## 2013-11-21 DIAGNOSIS — T84023A Instability of internal left knee prosthesis, initial encounter: Secondary | ICD-10-CM

## 2013-11-21 DIAGNOSIS — E7800 Pure hypercholesterolemia, unspecified: Secondary | ICD-10-CM | POA: Diagnosis present

## 2013-11-21 DIAGNOSIS — R413 Other amnesia: Secondary | ICD-10-CM | POA: Diagnosis present

## 2013-11-21 DIAGNOSIS — Z01812 Encounter for preprocedural laboratory examination: Secondary | ICD-10-CM

## 2013-11-21 DIAGNOSIS — H409 Unspecified glaucoma: Secondary | ICD-10-CM | POA: Diagnosis present

## 2013-11-21 DIAGNOSIS — H547 Unspecified visual loss: Secondary | ICD-10-CM | POA: Diagnosis present

## 2013-11-21 DIAGNOSIS — G4733 Obstructive sleep apnea (adult) (pediatric): Secondary | ICD-10-CM | POA: Diagnosis present

## 2013-11-21 DIAGNOSIS — R011 Cardiac murmur, unspecified: Secondary | ICD-10-CM | POA: Diagnosis present

## 2013-11-21 DIAGNOSIS — T84039A Mechanical loosening of unspecified internal prosthetic joint, initial encounter: Principal | ICD-10-CM | POA: Diagnosis present

## 2013-11-21 DIAGNOSIS — Z87891 Personal history of nicotine dependence: Secondary | ICD-10-CM

## 2013-11-21 DIAGNOSIS — Z6841 Body Mass Index (BMI) 40.0 and over, adult: Secondary | ICD-10-CM

## 2013-11-21 DIAGNOSIS — Z96659 Presence of unspecified artificial knee joint: Secondary | ICD-10-CM

## 2013-11-21 DIAGNOSIS — H9190 Unspecified hearing loss, unspecified ear: Secondary | ICD-10-CM | POA: Diagnosis present

## 2013-11-21 DIAGNOSIS — E785 Hyperlipidemia, unspecified: Secondary | ICD-10-CM | POA: Diagnosis present

## 2013-11-21 DIAGNOSIS — Y831 Surgical operation with implant of artificial internal device as the cause of abnormal reaction of the patient, or of later complication, without mention of misadventure at the time of the procedure: Secondary | ICD-10-CM | POA: Diagnosis present

## 2013-11-21 DIAGNOSIS — M810 Age-related osteoporosis without current pathological fracture: Secondary | ICD-10-CM | POA: Diagnosis present

## 2013-11-21 DIAGNOSIS — M129 Arthropathy, unspecified: Secondary | ICD-10-CM | POA: Diagnosis present

## 2013-11-21 HISTORY — PX: TOTAL KNEE ARTHROPLASTY: SHX125

## 2013-11-21 LAB — TYPE AND SCREEN
ABO/RH(D): A POS
Antibody Screen: NEGATIVE

## 2013-11-21 SURGERY — ARTHROPLASTY, KNEE, TOTAL
Anesthesia: Spinal | Site: Knee | Laterality: Left

## 2013-11-21 MED ORDER — MIDAZOLAM HCL 2 MG/2ML IJ SOLN
INTRAMUSCULAR | Status: AC
  Filled 2013-11-21: qty 2

## 2013-11-21 MED ORDER — LACTATED RINGERS IV SOLN: INTRAVENOUS | Status: DC

## 2013-11-21 MED ORDER — ACETAMINOPHEN 500 MG PO TABS
1000.0000 mg | ORAL_TABLET | Freq: Four times a day (QID) | ORAL | Status: AC
  Administered 2013-11-21 – 2013-11-22 (×4): 1000 mg via ORAL
  Filled 2013-11-21 (×4): qty 2

## 2013-11-21 MED ORDER — BUPIVACAINE IN DEXTROSE 0.75-8.25 % IT SOLN
INTRATHECAL | Status: DC | PRN
  Administered 2013-11-21: 1.6 mL via INTRATHECAL

## 2013-11-21 MED ORDER — ONDANSETRON HCL 4 MG/2ML IJ SOLN
INTRAMUSCULAR | Status: AC
  Filled 2013-11-21: qty 2

## 2013-11-21 MED ORDER — METHOCARBAMOL 500 MG PO TABS
500.0000 mg | ORAL_TABLET | Freq: Four times a day (QID) | ORAL | Status: DC | PRN
  Administered 2013-11-22 – 2013-11-23 (×3): 500 mg via ORAL
  Filled 2013-11-21 (×3): qty 1

## 2013-11-21 MED ORDER — SODIUM CHLORIDE 0.9 % IR SOLN
Status: DC | PRN
  Administered 2013-11-21: 1000 mL

## 2013-11-21 MED ORDER — VENLAFAXINE HCL ER 150 MG PO CP24
150.0000 mg | ORAL_CAPSULE | Freq: Two times a day (BID) | ORAL | Status: DC
Start: 2013-11-21 — End: 2013-11-23
  Administered 2013-11-21 – 2013-11-23 (×4): 150 mg via ORAL
  Filled 2013-11-21 (×5): qty 1

## 2013-11-21 MED ORDER — PROPOFOL 10 MG/ML IV BOLUS
INTRAVENOUS | Status: AC
  Filled 2013-11-21: qty 20

## 2013-11-21 MED ORDER — ONDANSETRON HCL 4 MG/2ML IJ SOLN
INTRAMUSCULAR | Status: DC | PRN
  Administered 2013-11-21: 4 mg via INTRAVENOUS

## 2013-11-21 MED ORDER — BISACODYL 10 MG RE SUPP: 10.0000 mg | Freq: Every day | RECTAL | Status: DC | PRN

## 2013-11-21 MED ORDER — CEFAZOLIN SODIUM-DEXTROSE 2-3 GM-% IV SOLR
2.0000 g | Freq: Four times a day (QID) | INTRAVENOUS | Status: AC
  Administered 2013-11-21 – 2013-11-22 (×2): 2 g via INTRAVENOUS
  Filled 2013-11-21 (×2): qty 50

## 2013-11-21 MED ORDER — MENTHOL 3 MG MT LOZG: 1.0000 | LOZENGE | OROMUCOSAL | Status: DC | PRN

## 2013-11-21 MED ORDER — ONDANSETRON HCL 4 MG PO TABS: 4.0000 mg | ORAL_TABLET | Freq: Four times a day (QID) | ORAL | Status: DC | PRN

## 2013-11-21 MED ORDER — PHENOL 1.4 % MT LIQD: 1.0000 | OROMUCOSAL | Status: DC | PRN

## 2013-11-21 MED ORDER — DIPHENHYDRAMINE HCL 12.5 MG/5ML PO ELIX: 12.5000 mg | ORAL_SOLUTION | ORAL | Status: DC | PRN

## 2013-11-21 MED ORDER — BUPROPION HCL ER (XL) 150 MG PO TB24
150.0000 mg | ORAL_TABLET | Freq: Every morning | ORAL | Status: DC
  Administered 2013-11-22 – 2013-11-23 (×2): 150 mg via ORAL
  Filled 2013-11-21 (×2): qty 1

## 2013-11-21 MED ORDER — CEFAZOLIN SODIUM-DEXTROSE 2-3 GM-% IV SOLR
2.0000 g | INTRAVENOUS | Status: AC
Start: 2013-11-22 — End: 2013-11-21
  Administered 2013-11-21: 2 g via INTRAVENOUS

## 2013-11-21 MED ORDER — MIDAZOLAM HCL 5 MG/5ML IJ SOLN
INTRAMUSCULAR | Status: DC | PRN
  Administered 2013-11-21: 2 mg via INTRAVENOUS

## 2013-11-21 MED ORDER — DEXTROSE-NACL 5-0.9 % IV SOLN
INTRAVENOUS | Status: DC
  Administered 2013-11-21: 20:00:00 via INTRAVENOUS

## 2013-11-21 MED ORDER — HYDROMORPHONE HCL PF 1 MG/ML IJ SOLN
INTRAMUSCULAR | Status: AC
Start: 2013-11-21 — End: 2013-11-22
  Filled 2013-11-21: qty 1

## 2013-11-21 MED ORDER — ACETAMINOPHEN 650 MG RE SUPP: 650.0000 mg | Freq: Four times a day (QID) | RECTAL | Status: DC | PRN

## 2013-11-21 MED ORDER — ACETAMINOPHEN 10 MG/ML IV SOLN
1000.0000 mg | Freq: Once | INTRAVENOUS | Status: AC
  Administered 2013-11-21: 1000 mg via INTRAVENOUS
  Filled 2013-11-21: qty 100

## 2013-11-21 MED ORDER — DEXAMETHASONE SODIUM PHOSPHATE 10 MG/ML IJ SOLN: 10.0000 mg | Freq: Once | INTRAMUSCULAR | Status: DC

## 2013-11-21 MED ORDER — ACETAMINOPHEN 325 MG PO TABS: 650.0000 mg | ORAL_TABLET | Freq: Four times a day (QID) | ORAL | Status: DC | PRN

## 2013-11-21 MED ORDER — DOCUSATE SODIUM 100 MG PO CAPS
100.0000 mg | ORAL_CAPSULE | Freq: Two times a day (BID) | ORAL | Status: DC
  Administered 2013-11-21 – 2013-11-23 (×4): 100 mg via ORAL

## 2013-11-21 MED ORDER — METOCLOPRAMIDE HCL 10 MG PO TABS: 5.0000 mg | ORAL_TABLET | Freq: Three times a day (TID) | ORAL | Status: DC | PRN

## 2013-11-21 MED ORDER — METOCLOPRAMIDE HCL 5 MG/ML IJ SOLN: 5.0000 mg | Freq: Three times a day (TID) | INTRAMUSCULAR | Status: DC | PRN

## 2013-11-21 MED ORDER — DEXAMETHASONE 6 MG PO TABS
10.0000 mg | ORAL_TABLET | Freq: Every day | ORAL | Status: AC
  Administered 2013-11-22: 10 mg via ORAL
  Filled 2013-11-21: qty 1

## 2013-11-21 MED ORDER — POLYETHYLENE GLYCOL 3350 17 G PO PACK: 17.0000 g | PACK | Freq: Every day | ORAL | Status: DC | PRN

## 2013-11-21 MED ORDER — SODIUM CHLORIDE 0.9 % IV SOLN: INTRAVENOUS | Status: DC

## 2013-11-21 MED ORDER — TRAVOPROST 0.004 % OP SOLN: 1.0000 [drp] | Freq: Every day | OPHTHALMIC | Status: DC

## 2013-11-21 MED ORDER — RIVAROXABAN 10 MG PO TABS
10.0000 mg | ORAL_TABLET | Freq: Every day | ORAL | Status: DC
  Administered 2013-11-22 – 2013-11-23 (×2): 10 mg via ORAL
  Filled 2013-11-21 (×3): qty 1

## 2013-11-21 MED ORDER — DEXAMETHASONE SODIUM PHOSPHATE 10 MG/ML IJ SOLN
INTRAMUSCULAR | Status: DC | PRN
  Administered 2013-11-21: 10 mg via INTRAVENOUS

## 2013-11-21 MED ORDER — DEXAMETHASONE SODIUM PHOSPHATE 10 MG/ML IJ SOLN
10.0000 mg | Freq: Every day | INTRAMUSCULAR | Status: AC
  Filled 2013-11-21: qty 1

## 2013-11-21 MED ORDER — EPHEDRINE SULFATE 50 MG/ML IJ SOLN
INTRAMUSCULAR | Status: DC | PRN
  Administered 2013-11-21 (×3): 5 mg via INTRAVENOUS

## 2013-11-21 MED ORDER — PROMETHAZINE HCL 25 MG/ML IJ SOLN: 6.2500 mg | INTRAMUSCULAR | Status: DC | PRN

## 2013-11-21 MED ORDER — SODIUM CHLORIDE 0.9 % IJ SOLN
INTRAMUSCULAR | Status: DC | PRN
  Administered 2013-11-21: 30 mL

## 2013-11-21 MED ORDER — MORPHINE SULFATE 2 MG/ML IJ SOLN
INTRAMUSCULAR | Status: AC
  Filled 2013-11-21: qty 1

## 2013-11-21 MED ORDER — LACTATED RINGERS IV SOLN
INTRAVENOUS | Status: DC
  Administered 2013-11-21 (×2): via INTRAVENOUS
  Administered 2013-11-21: 1000 mL via INTRAVENOUS

## 2013-11-21 MED ORDER — ONDANSETRON HCL 4 MG/2ML IJ SOLN: 4.0000 mg | Freq: Four times a day (QID) | INTRAMUSCULAR | Status: DC | PRN

## 2013-11-21 MED ORDER — MUPIROCIN CALCIUM 2 % NA OINT
1.0000 "application " | TOPICAL_OINTMENT | Freq: Two times a day (BID) | NASAL | Status: DC
  Administered 2013-11-21 – 2013-11-23 (×4): 1 via NASAL
  Filled 2013-11-21 (×18): qty 1

## 2013-11-21 MED ORDER — ZOLPIDEM TARTRATE 10 MG PO TABS: 5.0000 mg | ORAL_TABLET | Freq: Every evening | ORAL | Status: DC | PRN

## 2013-11-21 MED ORDER — BUPIVACAINE HCL 0.25 % IJ SOLN
INTRAMUSCULAR | Status: DC | PRN
  Administered 2013-11-21: 20 mL

## 2013-11-21 MED ORDER — FENTANYL CITRATE 0.05 MG/ML IJ SOLN
INTRAMUSCULAR | Status: AC
  Filled 2013-11-21: qty 2

## 2013-11-21 MED ORDER — METHOCARBAMOL 100 MG/ML IJ SOLN
500.0000 mg | Freq: Four times a day (QID) | INTRAMUSCULAR | Status: DC | PRN
  Administered 2013-11-21: 500 mg via INTRAVENOUS
  Filled 2013-11-21: qty 5

## 2013-11-21 MED ORDER — OXYCODONE HCL 5 MG PO TABS
5.0000 mg | ORAL_TABLET | ORAL | Status: DC | PRN
  Administered 2013-11-21 – 2013-11-23 (×9): 10 mg via ORAL
  Filled 2013-11-21 (×9): qty 2

## 2013-11-21 MED ORDER — BUPIVACAINE LIPOSOME 1.3 % IJ SUSP
20.0000 mL | Freq: Once | INTRAMUSCULAR | Status: DC
  Filled 2013-11-21: qty 20

## 2013-11-21 MED ORDER — KETOROLAC TROMETHAMINE 15 MG/ML IJ SOLN
7.5000 mg | Freq: Four times a day (QID) | INTRAMUSCULAR | Status: AC | PRN
  Administered 2013-11-21 – 2013-11-22 (×2): 7.5 mg via INTRAVENOUS
  Filled 2013-11-21 (×2): qty 1

## 2013-11-21 MED ORDER — SODIUM CHLORIDE 0.9 % IJ SOLN
INTRAMUSCULAR | Status: AC
  Filled 2013-11-21: qty 50

## 2013-11-21 MED ORDER — HYDROMORPHONE HCL PF 1 MG/ML IJ SOLN
0.2500 mg | INTRAMUSCULAR | Status: DC | PRN
  Administered 2013-11-21 (×4): 0.5 mg via INTRAVENOUS

## 2013-11-21 MED ORDER — HYDROMORPHONE HCL PF 1 MG/ML IJ SOLN
INTRAMUSCULAR | Status: AC
  Filled 2013-11-21: qty 1

## 2013-11-21 MED ORDER — ALPRAZOLAM 0.5 MG PO TABS
0.5000 mg | ORAL_TABLET | Freq: Every day | ORAL | Status: DC | PRN
  Administered 2013-11-22: 0.5 mg via ORAL
  Filled 2013-11-21: qty 1

## 2013-11-21 MED ORDER — BUPIVACAINE LIPOSOME 1.3 % IJ SUSP
INTRAMUSCULAR | Status: DC | PRN
  Administered 2013-11-21: 20 mL

## 2013-11-21 MED ORDER — CHLORHEXIDINE GLUCONATE 4 % EX LIQD: 60.0000 mL | Freq: Once | CUTANEOUS | Status: DC

## 2013-11-21 MED ORDER — MEPERIDINE HCL 50 MG/ML IJ SOLN: 6.2500 mg | INTRAMUSCULAR | Status: DC | PRN

## 2013-11-21 MED ORDER — MORPHINE SULFATE 2 MG/ML IJ SOLN
1.0000 mg | INTRAMUSCULAR | Status: DC | PRN
Start: 2013-11-21 — End: 2013-11-23
  Administered 2013-11-21 – 2013-11-22 (×2): 2 mg via INTRAVENOUS
  Filled 2013-11-21: qty 1

## 2013-11-21 MED ORDER — LAMOTRIGINE 100 MG PO TABS
100.0000 mg | ORAL_TABLET | Freq: Every day | ORAL | Status: DC
  Administered 2013-11-22 – 2013-11-23 (×2): 100 mg via ORAL
  Filled 2013-11-21 (×2): qty 1

## 2013-11-21 MED ORDER — 0.9 % SODIUM CHLORIDE (POUR BTL) OPTIME
TOPICAL | Status: DC | PRN
  Administered 2013-11-21: 1000 mL

## 2013-11-21 MED ORDER — FENTANYL CITRATE 0.05 MG/ML IJ SOLN
INTRAMUSCULAR | Status: DC | PRN
  Administered 2013-11-21: 100 ug via INTRAVENOUS

## 2013-11-21 MED ORDER — DEXAMETHASONE SODIUM PHOSPHATE 10 MG/ML IJ SOLN
INTRAMUSCULAR | Status: AC
  Filled 2013-11-21: qty 1

## 2013-11-21 MED ORDER — PROPOFOL INFUSION 10 MG/ML OPTIME
INTRAVENOUS | Status: DC | PRN
  Administered 2013-11-21: 100 ug/kg/min via INTRAVENOUS
  Administered 2013-11-21: 50 ug/kg/min via INTRAVENOUS

## 2013-11-21 MED ORDER — LATANOPROST 0.005 % OP SOLN
1.0000 [drp] | Freq: Every day | OPHTHALMIC | Status: DC
  Administered 2013-11-21 – 2013-11-22 (×2): 1 [drp] via OPHTHALMIC
  Filled 2013-11-21: qty 2.5

## 2013-11-21 MED ORDER — FLEET ENEMA 7-19 GM/118ML RE ENEM: 1.0000 | ENEMA | Freq: Once | RECTAL | Status: AC | PRN

## 2013-11-21 MED ORDER — BUPIVACAINE HCL (PF) 0.25 % IJ SOLN
INTRAMUSCULAR | Status: AC
  Filled 2013-11-21: qty 30

## 2013-11-21 MED ORDER — TRANEXAMIC ACID 100 MG/ML IV SOLN
1000.0000 mg | INTRAVENOUS | Status: AC
  Administered 2013-11-21: 1000 mg via INTRAVENOUS
  Filled 2013-11-21 (×2): qty 10

## 2013-11-21 SURGICAL SUPPLY — 86 items
ADAPTER BOLT FEMORAL +2/-2 (Knees) ×2 IMPLANT
ADPR FEM +2/-2 OFST BOLT (Knees) ×1 IMPLANT
ADPR FEM 5D STRL KN PFC SGM (Orthopedic Implant) ×1 IMPLANT
AUG FEM SZ2.5 4 CMB POST STRL (Knees) ×2 IMPLANT
AUG FEM SZ2.5 4STRL LF KN LT (Knees) ×2 IMPLANT
AUG TIB SZ2.5 10 REV STP WDG (Knees) ×2 IMPLANT
AUGMENT DIST PFC 4MM (Knees) IMPLANT
BAG SPEC THK2 15X12 ZIP CLS (MISCELLANEOUS) ×1
BAG ZIPLOCK 12X15 (MISCELLANEOUS) ×3 IMPLANT
BANDAGE ELASTIC 6 VELCRO ST LF (GAUZE/BANDAGES/DRESSINGS) ×3 IMPLANT
BANDAGE ESMARK 6X9 LF (GAUZE/BANDAGES/DRESSINGS) ×1 IMPLANT
BLADE SAG 18X100X1.27 (BLADE) ×3 IMPLANT
BLADE SAW SGTL 11.0X1.19X90.0M (BLADE) ×3 IMPLANT
BNDG CMPR 9X6 STRL LF SNTH (GAUZE/BANDAGES/DRESSINGS) ×1
BNDG ESMARK 6X9 LF (GAUZE/BANDAGES/DRESSINGS) ×3
BONE CEMENT GENTAMICIN (Cement) ×9 IMPLANT
BOWL SMART MIX CTS (DISPOSABLE) ×1 IMPLANT
CEMENT BONE GENTAMICIN 40 (Cement) IMPLANT
CEMENT RESTRICTOR DEPUY SZ 4 (Cement) ×2 IMPLANT
CLOSURE WOUND 1/2 X4 (GAUZE/BANDAGES/DRESSINGS) ×1
CUFF TOURN SGL QUICK 34 (TOURNIQUET CUFF) ×3
CUFF TRNQT CYL 34X4X40X1 (TOURNIQUET CUFF) ×1 IMPLANT
DECANTER SPIKE VIAL GLASS SM (MISCELLANEOUS) ×3 IMPLANT
DISAL AUG PFC 4MM (Knees) ×6 IMPLANT
DRAPE EXTREMITY T 121X128X90 (DRAPE) ×3 IMPLANT
DRAPE POUCH INSTRU U-SHP 10X18 (DRAPES) ×3 IMPLANT
DRAPE U-SHAPE 47X51 STRL (DRAPES) ×3 IMPLANT
DRSG ADAPTIC 3X8 NADH LF (GAUZE/BANDAGES/DRESSINGS) ×3 IMPLANT
DRSG PAD ABDOMINAL 8X10 ST (GAUZE/BANDAGES/DRESSINGS) ×3 IMPLANT
DURAPREP 26ML APPLICATOR (WOUND CARE) ×3 IMPLANT
ELECT REM PT RETURN 9FT ADLT (ELECTROSURGICAL) ×3
ELECTRODE REM PT RTRN 9FT ADLT (ELECTROSURGICAL) ×1 IMPLANT
EVACUATOR 1/8 PVC DRAIN (DRAIN) ×3 IMPLANT
FACESHIELD WRAPAROUND (MASK) ×15 IMPLANT
FACESHIELD WRAPAROUND OR TEAM (MASK) ×5 IMPLANT
FEMORAL ADAPTER (Orthopedic Implant) ×2 IMPLANT
FEMORAL PFC TC3 (Orthopedic Implant) ×2 IMPLANT
GLOVE BIO SURGEON STRL SZ7.5 (GLOVE) ×2 IMPLANT
GLOVE BIO SURGEON STRL SZ8 (GLOVE) ×3 IMPLANT
GLOVE BIOGEL PI IND STRL 7.5 (GLOVE) IMPLANT
GLOVE BIOGEL PI IND STRL 8 (GLOVE) ×2 IMPLANT
GLOVE BIOGEL PI IND STRL 8.5 (GLOVE) IMPLANT
GLOVE BIOGEL PI INDICATOR 7.5 (GLOVE) ×2
GLOVE BIOGEL PI INDICATOR 8 (GLOVE) ×4
GLOVE BIOGEL PI INDICATOR 8.5 (GLOVE) ×2
GLOVE SURG SS PI 6.5 STRL IVOR (GLOVE) IMPLANT
GLOVE SURG SS PI 7.5 STRL IVOR (GLOVE) ×4 IMPLANT
GLOVE SURG SS PI 8.5 STRL IVOR (GLOVE) ×2
GLOVE SURG SS PI 8.5 STRL STRW (GLOVE) IMPLANT
GOWN STRL REUS W/TWL 2XL LVL3 (GOWN DISPOSABLE) ×4 IMPLANT
GOWN STRL REUS W/TWL LRG LVL3 (GOWN DISPOSABLE) ×3 IMPLANT
GOWN STRL REUS W/TWL XL LVL3 (GOWN DISPOSABLE) ×4 IMPLANT
HANDPIECE INTERPULSE COAX TIP (DISPOSABLE) ×3
IMMOBILIZER KNEE 20 (SOFTGOODS) ×3 IMPLANT
INSERT TIBIAL TC3 RP SZ 2.5 (Knees) ×2 IMPLANT
KIT BASIN OR (CUSTOM PROCEDURE TRAY) ×3 IMPLANT
MANIFOLD NEPTUNE II (INSTRUMENTS) ×3 IMPLANT
NDL SAFETY ECLIPSE 18X1.5 (NEEDLE) ×2 IMPLANT
NEEDLE HYPO 18GX1.5 SHARP (NEEDLE) ×6
NS IRRIG 1000ML POUR BTL (IV SOLUTION) ×3 IMPLANT
PACK TOTAL JOINT (CUSTOM PROCEDURE TRAY) ×3 IMPLANT
PAD ABD 8X10 STRL (GAUZE/BANDAGES/DRESSINGS) ×2 IMPLANT
PADDING CAST COTTON 6X4 STRL (CAST SUPPLIES) ×5 IMPLANT
POSITIONER SURGICAL ARM (MISCELLANEOUS) ×3 IMPLANT
POST AUG PFC 4MM SZ 2.5 (Knees) ×4 IMPLANT
SET HNDPC FAN SPRY TIP SCT (DISPOSABLE) ×1 IMPLANT
SPONGE GAUZE 4X4 12PLY (GAUZE/BANDAGES/DRESSINGS) ×3 IMPLANT
STEM TIBIA PFC 13X30MM (Stem) ×2 IMPLANT
STEM UNIVERSAL REVISION 75X14 (Stem) ×2 IMPLANT
STRIP CLOSURE SKIN 1/2X4 (GAUZE/BANDAGES/DRESSINGS) ×3 IMPLANT
SUCTION FRAZIER 12FR DISP (SUCTIONS) ×3 IMPLANT
SUT MNCRL AB 4-0 PS2 18 (SUTURE) ×3 IMPLANT
SUT VIC AB 2-0 CT1 27 (SUTURE) ×9
SUT VIC AB 2-0 CT1 TAPERPNT 27 (SUTURE) ×3 IMPLANT
SUT VLOC 180 0 24IN GS25 (SUTURE) ×3 IMPLANT
SYR 20CC LL (SYRINGE) ×3 IMPLANT
SYR 50ML LL SCALE MARK (SYRINGE) ×3 IMPLANT
TOWEL OR 17X26 10 PK STRL BLUE (TOWEL DISPOSABLE) ×3 IMPLANT
TOWEL OR NON WOVEN STRL DISP B (DISPOSABLE) ×2 IMPLANT
TOWER CARTRIDGE SMART MIX (DISPOSABLE) ×2 IMPLANT
TRAY FOLEY CATH 14FRSI W/METER (CATHETERS) ×3 IMPLANT
TRAY REVISION SZ 2.5 (Knees) ×2 IMPLANT
TRAY SLEEVE CEM ML (Knees) ×2 IMPLANT
WATER STERILE IRR 1500ML POUR (IV SOLUTION) ×5 IMPLANT
WEDGE STEP 2.5 10MM (Knees) ×4 IMPLANT
WRAP KNEE MAXI GEL POST OP (GAUZE/BANDAGES/DRESSINGS) ×3 IMPLANT

## 2013-11-21 NOTE — Anesthesia Preprocedure Evaluation (Addendum)
Anesthesia Evaluation  Patient identified by MRN, date of birth, ID band Patient awake    Reviewed: Allergy & Precautions, H&P , NPO status , Patient's Chart, lab work & pertinent test results  Airway Mallampati: II TM Distance: >3 FB Neck ROM: Full    Dental no notable dental hx.    Pulmonary sleep apnea and Continuous Positive Airway Pressure Ventilation , former smoker,  breath sounds clear to auscultation  Pulmonary exam normal       Cardiovascular negative cardio ROS  Rhythm:Regular Rate:Normal     Neuro/Psych negative neurological ROS  negative psych ROS   GI/Hepatic negative GI ROS, Neg liver ROS,   Endo/Other  Morbid obesity  Renal/GU negative Renal ROS  negative genitourinary   Musculoskeletal negative musculoskeletal ROS (+)   Abdominal   Peds negative pediatric ROS (+)  Hematology negative hematology ROS (+)   Anesthesia Other Findings   Reproductive/Obstetrics negative OB ROS                          Anesthesia Physical Anesthesia Plan  ASA: III  Anesthesia Plan: Spinal   Post-op Pain Management:    Induction:   Airway Management Planned: Simple Face Mask  Additional Equipment:   Intra-op Plan:   Post-operative Plan:   Informed Consent: I have reviewed the patients History and Physical, chart, labs and discussed the procedure including the risks, benefits and alternatives for the proposed anesthesia with the patient or authorized representative who has indicated his/her understanding and acceptance.   Dental advisory given  Plan Discussed with: CRNA  Anesthesia Plan Comments:         Anesthesia Quick Evaluation

## 2013-11-21 NOTE — Interval H&P Note (Signed)
History and Physical Interval Note:  11/21/2013 2:31 PM  [name] [name]  has presented today for surgery, with the diagnosis of LEFT TOTAL KNEE ARTHROPLASTY/INSTABILITY  The various methods of treatment have been discussed with the patient and family. After consideration of risks, benefits and other options for treatment, the patient has consented to  Procedure(s): LEFT TOTAL KNEE POLYETHLINE VERSES TOTAL KNEE ARTHROPLASTY REVISION (Left) as a surgical intervention .  The patient's history has been reviewed, patient examined, no change in status, stable for surgery.  I have reviewed the patient's chart and labs.  Questions were answered to the patient's satisfaction.     [name] [name]

## 2013-11-21 NOTE — Anesthesia Postprocedure Evaluation (Signed)
Anesthesia Post Note  Patient: [name] [name]  Procedure(s) Performed: Procedure(s) (LRB): LEFT TOTAL KNEE  ARTHROPLASTY REVISION (Left)  Anesthesia type: General  Patient location: PACU  Post pain: Pain level controlled  Post assessment: Post-op Vital signs reviewed  Last Vitals:  Filed Vitals:   11/21/13 1930  BP: 129/80  Pulse: 63  Temp: 36.5 C  Resp: 16    Post vital signs: Reviewed  Level of consciousness: sedated  Complications: No apparent anesthesia complications

## 2013-11-21 NOTE — Anesthesia Procedure Notes (Signed)
Spinal  Patient location during procedure: OR Staffing Anesthesiologist: Chiquetta Langner Performed by: anesthesiologist  Preanesthetic Checklist Completed: patient identified, site marked, surgical consent, pre-op evaluation, timeout performed, IV checked, risks and benefits discussed and monitors and equipment checked Spinal Block Patient position: sitting Prep: Betadine Patient monitoring: heart rate, continuous pulse ox and blood pressure Approach: right paramedian Location: L4-5 Injection technique: single-shot Needle Needle type: Spinocan  Needle gauge: 22 G Needle length: 9 cm Additional Notes Expiration date of kit checked and confirmed. Patient tolerated procedure well, without complications.     

## 2013-11-21 NOTE — Transfer of Care (Signed)
Immediate Anesthesia Transfer of Care Note  Patient: [name] [name]  Procedure(s) Performed: Procedure(s): LEFT TOTAL KNEE  ARTHROPLASTY REVISION (Left)  Patient Location: PACU  Anesthesia Type:MAC and Spinal  Level of Consciousness: Patient easily awoken, sedated, comfortable, cooperative, following commands, responds to stimulation.   Airway & Oxygen Therapy: Patient spontaneously breathing, ventilating well, oxygen via simple oxygen mask.  Post-op Assessment: Report given to PACU RN, vital signs reviewed and stable, moving all extremities.   Post vital signs: Reviewed and stable.  Complications: No apparent anesthesia complications

## 2013-11-21 NOTE — Brief Op Note (Signed)
11/21/2013  4:45 PM  PATIENT:  [name] [name]  58 y.o. female  PRE-OPERATIVE DIAGNOSIS:  LEFT TOTAL KNEE ARTHROPLASTY /INSTABILITY  POST-OPERATIVE DIAGNOSIS:  LEFT TOTAL KNEE ARTHROPLASTY /INSTABILITY  PROCEDURE:  Procedure(s): LEFT TOTAL KNEE  ARTHROPLASTY REVISION (Left)  SURGEON:  Surgeon(s) and Role:    * Loanne DrillingFrank V Tell Rozelle, MD - Primary  PHYSICIAN ASSISTANT:   ASSISTANTS: Avel Peacerew Perkins, PA-C   ANESTHESIA:   spinal  EBL:  Total I/O In: 2000 [I.V.:2000] Out: 400 [Urine:400]  DRAINS: (Medium) Hemovact drain(s) in the left knee with  Suction Open   LOCAL MEDICATIONS USED:  OTHER Exparel  COUNTS:  YES  TOURNIQUET:   Total Tourniquet Time Documented: Thigh (Left) - 44 minutes Thigh (Left) - 27 minutes Total: Thigh (Left) - 71 minutes   DICTATION: .Other Dictation: Dictation Number [id number]  PLAN OF CARE: Admit to inpatient   PATIENT DISPOSITION:  PACU - hemodynamically stable.

## 2013-11-21 NOTE — H&P (View-Only) (Signed)
[name] [name] DOB: 05/05/1956 Married / Language: English / Race: White Female  Date of Admission:  4-17- 2015  Chief Complaint:  Left Knee Instability  History of Present Illness The patient is a 58 year old female who comes in for a preoperative History and Physical. The patient is scheduled for a left total knee arthroplasty (revision) to be performed by Dr. Frank V. Aluisio, MD at [facility] on 11/21/2013. The patient is a 58 year old female who presents for follow up of their knee. The patient is being followed for their left knee pain. They are now 7 year(s) out from bilateral knee pain. Symptoms reported today include: pain and instability. The patient feels that they are doing poorly and report their pain level to be moderate. Current treatment includes: bracing and water exercise. The following medication has been used for pain control: Tylenol with codeine (has not really helped much).  [name] [name] has had some problems with the left knee for several months which brought her in for eval. The knee has been buckling on her at times and causing some pain and discomfort. She describes the pain on the anterior portion of her knee and the inside of the knee. It was felt that she had some instability of her left knee and was placed into an economy hinge brace. She was also setup for some outpatient therapy. She ahs been using the brace and can tell a differnce in her stability and pain. She still will have pain at night at times though. She denies any infectious or constitutional symptoms with the onset of the knee pain. She did not go to outpatient since she goes to her own gym three days a week and works with a trainer. It is of note that she has lost 40 pounds already. The patient does have the popping still in her left knee. She has soreness that the physical therapy did not help. She feels like the brace does help at times. They have been treated conservatively in the  past for the above stated problem and despite conservative measures, they continue to have progressive pain and severe functional limitations and dysfunction. They have failed non-operative management including home exercise, medications, and bracing. It is felt that they would benefit from undergoing polyethelene revision. Risks and benefits of the procedure have been discussed with the patient and they elect to proceed with surgery. There are no active contraindications to surgery such as ongoing infection or rapidly progressive neurological disease.   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 Brother. Osteoarthritis. Maternal Grandmother, Mother. Depression. Father, Mother. Congestive Heart Failure. Paternal Grandfather. Heart Disease. Paternal Grandfather. Drug / Alcohol Addiction. Brother, Mother. Chronic Obstructive Lung Disease. Maternal Grandmother.    Social History Tobacco / smoke exposure. 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 working full time Tobacco use. Former smoker. 06/17/2013: smoke(d) 1 1/2 pack(s) per day uses less than 1/2 can(s) smokeless per week Not under pain contract No history of drug/alcohol rehab Number of flights of stairs before winded. 2-3 Current drinker. 06/17/2013: Currently drinks wine less than 5 times per week Exercise. Exercises weekly; does other and gym / weights Marital status. married Living situation. live with spouse Post-Surgical Plans. Home Advance Directives. Living Will, Healthcare POA    Medication History BuPROPion HCl ER (SR) (100MG Tablet ER 12HR,   Oral) Active. Effexor XR (150MG  Capsule ER 24HR, Oral) Active. Acetaminophen-Codeine #3 (300-30MG  Tablet, Oral) Active. Travatan Z  (0.004% Solution, Ophthalmic) Active. CoQ-10 ( Oral) Specific dose unknown - Active. B Complex ( Oral) Active. Vitamin C ( Oral) Specific dose unknown - Active. Fish Oil Active. Osteo Bi-Flex Regular Strength ( Oral) Specific dose unknown - Active. Glucosamine Chondroitin Complx ( Oral) Active. Medications Reconciled.   Past Surgical History Foot Surgery. right Hysterectomy. Date: 09/2005. complete (non-cancerous) Total Knee Replacement. bilateral Arthroscopy of Knee. left    Past Medical History Depression Anxiety Disorder Sleep Apnea. BiPAP Hypercholesterolemia Heart murmur Other disease, cancer, significant illness. Hearing loss Osteoporosis Glaucoma Impaired Vision Impaired Hearing Impaired Memory    Review of Systems General:Not Present- Chills, Fever, Night Sweats, Fatigue, Weight Gain, Weight Loss and Memory Loss. Skin:Not Present- Hives, Itching, Rash, Eczema and Lesions. HEENT:Not Present- Tinnitus, Headache, Double Vision, Visual Loss, Hearing Loss and Dentures. Respiratory:Not Present- Shortness of breath with exertion, Shortness of breath at rest, Allergies, Coughing up blood and Chronic Cough. Cardiovascular:Not Present- Chest Pain, Racing/skipping heartbeats, Difficulty Breathing Lying Down, Murmur, Swelling and Palpitations. Gastrointestinal:Not Present- Bloody Stool, Heartburn, Abdominal Pain, Vomiting, Nausea, Constipation, Diarrhea, Difficulty Swallowing, Jaundice and Loss of appetitie. Female Genitourinary:Not Present- Blood in Urine, Urinary frequency, Weak urinary stream, Discharge, Flank Pain, Incontinence, Painful Urination, Urgency, Urinary Retention and Urinating at Night. Musculoskeletal:Not Present- Muscle Weakness, Muscle Pain, Joint Swelling, Joint Pain, Back Pain, Morning Stiffness and Spasms. Neurological:Not Present- Tremor, Dizziness, Blackout spells, Paralysis, Difficulty with balance and Weakness. Psychiatric:Not  Present- Insomnia.    Vitals Weight: 198 lb Height: 61 in Weight was reported by patient. Height was reported by patient. Body Surface Area: 1.97 m Body Mass Index: 37.41 kg/m Pulse: 88 (Regular) Resp.: 16 (Unlabored) BP: 116/80 (Sitting, Left Arm, Standard)     Physical Exam The physical exam findings are as follows:   General Mental Status - Alert, cooperative and good historian. General Appearance- pleasant. Not in acute distress. Orientation- Oriented X3. Build & Nutrition- Well nourished and Well developed.   Head and Neck Head- normocephalic, atraumatic . Neck Global Assessment- supple. no bruit auscultated on the right and no bruit auscultated on the left.   Eye Vision- Wears corrective lenses. Pupil- Bilateral- Regular and Round. Motion- Bilateral- EOMI.   ENMT  bilateral hearing aids  Chest and Lung Exam Auscultation: Breath sounds:- clear at anterior chest wall and - clear at posterior chest wall. Adventitious sounds:- No Adventitious sounds.   Cardiovascular Auscultation:Rhythm- Regular rate and rhythm. Heart Sounds- S1 WNL and S2 WNL. Murmurs & Other Heart Sounds:Auscultation of the heart reveals - No Murmurs.   Abdomen Palpation/Percussion:Tenderness- Abdomen is non-tender to palpation. Rigidity (guarding)- Abdomen is soft. Auscultation:Auscultation of the abdomen reveals - Bowel sounds normal.   Female Genitourinary   Note: Not done, not pertinent to present illness  Musculoskeletal   Note: On exam she is in no distress. Her left knee shows no effusion. Her range is about 0 to 130. There is crepitus on range of motion. She does have some AP laxity in flexion as well as a little hyperextension.   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 Lequita HaltAluisio.  Plan is to go home.  PCP - Dr. Everlene OtherBouska - Patient  has been seen preoperatively and felt to be stable for surgery.  The patient does not have any contraindications and will receive TXA (tranexamic acid) prior to surgery.  Signed electronically by Joelene Millin, III PA-C

## 2013-11-22 LAB — BASIC METABOLIC PANEL
BUN: 9 mg/dL (ref 6–23)
CO2: 28 meq/L (ref 19–32)
Calcium: 9.1 mg/dL (ref 8.4–10.5)
Chloride: 99 mEq/L (ref 96–112)
Creatinine, Ser: 0.61 mg/dL (ref 0.50–1.10)
GFR calc Af Amer: >90 mL/min (ref >90)
GFR calc non Af Amer: >90 mL/min (ref >90)
GLUCOSE: 141 mg/dL — AB (ref 70–99)
Potassium: 5 mEq/L (ref 3.7–5.3)
SODIUM: 138 meq/L (ref 137–147)

## 2013-11-22 LAB — GLUCOSE, CAPILLARY: Glucose-Capillary: 155 mg/dL — ABNORMAL HIGH (ref 70–99)

## 2013-11-22 LAB — CBC
HCT: 32.4 % — ABNORMAL LOW (ref 36.0–46.0)
Hemoglobin: 10.9 g/dL — ABNORMAL LOW (ref 12.0–15.0)
MCH: 32.3 pg (ref 26.0–34.0)
MCHC: 33.6 g/dL (ref 30.0–36.0)
MCV: 96.1 fL (ref 78.0–100.0)
Platelets: 134 10*3/uL — ABNORMAL LOW (ref 150–400)
RBC: 3.37 MIL/uL — AB (ref 3.87–5.11)
RDW: 12 % (ref 11.5–15.5)
WBC: 9.2 10*3/uL (ref 4.0–10.5)

## 2013-11-22 MED ORDER — RIVAROXABAN 10 MG PO TABS: 10.0000 mg | ORAL_TABLET | Freq: Every day | ORAL | Status: DC

## 2013-11-22 MED ORDER — METHOCARBAMOL 500 MG PO TABS: 500.0000 mg | ORAL_TABLET | Freq: Four times a day (QID) | ORAL | Status: DC | PRN

## 2013-11-22 MED ORDER — OXYCODONE HCL 5 MG PO TABS: 5.0000 mg | ORAL_TABLET | ORAL | Status: DC | PRN

## 2013-11-22 NOTE — Progress Notes (Signed)
RT placed patient on BIPAP on auto titrate min 4 and max 20. Patient brought own mask and tubing. Water chamber is filled for humidification. Patient is tolerating well. RT to continue to monitor.

## 2013-11-22 NOTE — Discharge Instructions (Signed)
° °Dr. Frank Aluisio °Total Joint Specialist °[facility] °[address] °[place], [place] [address] °[phone] ° °TOTAL KNEE REPLACEMENT POSTOPERATIVE DIRECTIONS ° ° ° °Knee Rehabilitation, Guidelines Following Surgery  °Results after knee surgery are often greatly improved when you follow the exercise, range of motion and muscle strengthening exercises prescribed by your doctor. Safety measures are also important to protect the knee from further injury. Any time any of these exercises cause you to have increased pain or swelling in your knee joint, decrease the amount until you are comfortable again and slowly increase them. If you have problems or questions, call your caregiver or physical therapist for advice.  ° °HOME CARE INSTRUCTIONS  °Remove items at home which could result in a fall. This includes throw rugs or furniture in walking pathways.  °Continue medications as instructed at time of discharge. °You may have some home medications which will be placed on hold until you complete the course of blood thinner medication.  °You may start showering once you are discharged home but do not submerge the incision under water. Just pat the incision dry and apply a dry gauze dressing on daily. °Walk with walker as instructed.  °You may resume a sexual relationship in one month or when given the OK by  your doctor.  °· Use walker as long as suggested by your caregivers. °· Avoid periods of inactivity such as sitting longer than an hour when not asleep. This helps prevent blood clots.  °You may put full weight on your legs and walk as much as is comfortable.  °You may return to work once you are cleared by your doctor.  °Do not drive a car for 6 weeks or until released by you surgeon.  °· Do not drive while taking narcotics.  °Wear the elastic stockings for three weeks following surgery during the day but you may remove then at night. °Make sure you keep all of your appointments after your  operation with all of your doctors and caregivers. You should call the office at the above phone number and make an appointment for approximately two weeks after the date of your surgery. °Change the dressing daily and reapply a dry dressing each time. °Please pick up a stool softener and laxative for home use as long as you are requiring pain medications. °· Continue to use ice on the knee for pain and swelling from surgery. You may notice swelling that will progress down to the foot and ankle.  This is normal after surgery.  Elevate the leg when you are not up walking on it.   °It is important for you to complete the blood thinner medication as prescribed by your doctor. °· Continue to use the breathing machine which will help keep your temperature down.  It is common for your temperature to cycle up and down following surgery, especially at night when you are not up moving around and exerting yourself.  The breathing machine keeps your lungs expanded and your temperature down. ° °RANGE OF MOTION AND STRENGTHENING EXERCISES  °Rehabilitation of the knee is important following a knee injury or an operation. After just a few days of immobilization, the muscles of the thigh which control the knee become weakened and shrink (atrophy). Knee exercises are designed to build up the tone and strength of the thigh muscles and to improve knee motion. Often times heat used for twenty to thirty minutes before working out will loosen up your tissues and help with improving the   range of motion but do not use heat for the first two weeks following surgery. These exercises can be done on a training (exercise) mat, on the floor, on a table or on a bed. Use what ever works the best and is most comfortable for you Knee exercises include:  Leg Lifts - While your knee is still immobilized in a splint or cast, you can do straight leg raises. Lift the leg to 60 degrees, hold for 3 sec, and slowly lower the leg. Repeat 10-20 times 2-3  times daily. Perform this exercise against resistance later as your knee gets better.  Quad and Hamstring Sets - Tighten up the muscle on the front of the thigh (Quad) and hold for 5-10 sec. Repeat this 10-20 times hourly. Hamstring sets are done by pushing the foot backward against an object and holding for 5-10 sec. Repeat as with quad sets.  A rehabilitation program following serious knee injuries can speed recovery and prevent re-injury in the future due to weakened muscles. Contact your doctor or a physical therapist for more information on knee rehabilitation.   SKILLED REHAB INSTRUCTIONS: If the patient is transferred to a skilled rehab facility following release from the hospital, a list of the current medications will be sent to the facility for the patient to continue.  When discharged from the skilled rehab facility, please have the facility set up the patient's Home Health Physical Therapy prior to being released. Also, the skilled facility will be responsible for providing the patient with their medications at time of release from the facility to include their pain medication, the muscle relaxants, and their blood thinner medication. If the patient is still at the rehab facility at time of the two week follow up appointment, the skilled rehab facility will also need to assist the patient in arranging follow up appointment in our office and any transportation needs.  MAKE SURE YOU:  Understand these instructions.  Will watch your condition.  Will get help right away if you are not doing well or get worse.    Pick up stool softner and laxative for home. Do not submerge incision under water. May shower. Continue to use ice for pain and swelling from surgery.          Information on my medicine - XARELTO (Rivaroxaban)  This medication education was reviewed with me or my healthcare representative as part of my discharge preparation.  The pharmacist that spoke with me during my  hospital stay was:  Teressa Lowermanda M Jeymi Hepp, [facility]  Why was Xarelto prescribed for you? Xarelto was prescribed for you to reduce the risk of blood clots forming after orthopedic surgery. The medical term for these abnormal blood clots is venous thromboembolism (VTE).  What do you need to know about xarelto ? Take your Xarelto ONCE DAILY at the same time every day. You may take it either with or without food.  If you have difficulty swallowing the tablet whole, you may crush it and mix in applesauce just prior to taking your dose.  Take Xarelto exactly as prescribed by your doctor and DO NOT stop taking Xarelto without talking to the doctor who prescribed the medication.  Stopping without other VTE prevention medication to take the place of Xarelto may increase your risk of developing a clot.  After discharge, you should have regular check-up appointments with your healthcare provider that is prescribing your Xarelto.    What do you do if you miss a dose? If you miss a dose,  take it as soon as you remember on the same day then continue your regularly scheduled once daily regimen the next day. Do not take two doses of Xarelto on the same day.   Important Safety Information A possible side effect of Xarelto is bleeding. You should call your healthcare provider right away if you experience any of the following:   Bleeding from an injury or your nose that does not stop.   Unusual colored urine (red or dark brown) or unusual colored stools (red or black).   Unusual bruising for unknown reasons.   A serious fall or if you hit your head (even if there is no bleeding).  Some medicines may interact with Xarelto and might increase your risk of bleeding while on Xarelto. To help avoid this, consult your healthcare provider or pharmacist prior to using any new prescription or non-prescription medications, including herbals, vitamins, non-steroidal anti-inflammatory drugs (NSAIDs) and  supplements.  This website has more information on Xarelto: [email or web address].

## 2013-11-22 NOTE — Evaluation (Signed)
Physical Therapy Evaluation Patient Details Name: [name] [name] L Silver MRN: [id number] DOB: 07/29/1956 Today's Date: 11/22/2013   History of Present Illness  Pt is a 58 year old female s/p L TKA revision; PMH bilateral TKA 2008, hearing aids bilaterally (since childhood), OSA, obesity  Clinical Impression  Pt is s/p L TKA revision surgery resulting in functional limitations due to the deficits listed below (see PT Problem List).  Pt able to ambulate in hallway with RW.  Pt complained of increased pain with earlier visit to bathroom, therapist advised positioning for LE for transfers and use of knee immobilizer which decreased pain during evaluation.  Pt will benefit from skilled PT to increase their independence and safety with mobility to allow discharge.  Pt plans to discharge home tomorrow, needs to practice steps before d/c.    Follow Up Recommendations Home health PT    Equipment Recommendations  3in1 (PT)    Recommendations for Other Services       Precautions / Restrictions Precautions Precautions: Knee Required Braces or Orthoses: Knee Immobilizer - Left Knee Immobilizer - Left: Discontinue once straight leg raise with < 10 degree lag Restrictions Weight Bearing Restrictions: Yes LLE Weight Bearing: Weight bearing as tolerated      Mobility  Bed Mobility Overal bed mobility: Needs Assistance Bed Mobility: Supine to Sit;Sit to Supine     Supine to sit: HOB elevated;Min assist Sit to supine: Supervision   General bed mobility comments: verbal cues for LE managment and safety  Transfers Overall transfer level: Needs assistance Equipment used: Rolling walker (2 wheeled) Transfers: Sit to/from Stand Sit to Stand: Min guard         General transfer comment: verbal cues for safety and LE positioning; pt had increased pain with WB through knee when standing, reinforced positioning to reduce WB  Ambulation/Gait Ambulation/Gait assistance: Min guard Ambulation Distance  (Feet): 40 Feet Assistive device: Rolling walker (2 wheeled) Gait Pattern/deviations: Step-to pattern;Decreased stride length;Antalgic Gait velocity: decr   General Gait Details: verbal cues for safety and sequencing with RW  Stairs            Wheelchair Mobility    Modified Rankin (Stroke Patients Only)       Balance                                             Pertinent Vitals/Pain Activity to tolerance.  Pain at incision site, pt premedicated.    Home Living Family/patient expects to be discharged to:: Private residence Living Arrangements: Spouse/significant other Available Help at Discharge: Family;Friend(s);Available PRN/intermittently Type of Home: House Home Access: Stairs to enter Entrance Stairs-Rails: Right Entrance Stairs-Number of Steps: 4 Home Layout: One level Home Equipment: Walker - 2 wheels;Grab bars - toilet      Prior Function Level of Independence: Independent               Hand Dominance        Extremity/Trunk Assessment   Upper Extremity Assessment: Overall WFL for tasks assessed           Lower Extremity Assessment: LLE deficits/detail   LLE Deficits / Details: fair quad contraction, unable to perform SLR, ROM to be assessed next visit; bleeding through bandage from surgical site, RN notified  Cervical / Trunk Assessment: Normal  Communication   Communication: No difficulties  Cognition Arousal/Alertness: Awake/alert Behavior [facility] for  tasks assessed/performed Overall Cognitive Status: Within Functional Limits for tasks assessed                      General Comments      Exercises        Assessment/Plan    PT Assessment Patient needs continued PT services  PT Diagnosis Difficulty walking   PT Problem List Decreased strength;Decreased range of motion;Decreased activity tolerance;Decreased knowledge of use of DME;Decreased mobility;Pain  PT Treatment Interventions DME  instruction;Gait training;Stair training;Functional mobility training;Therapeutic exercise;Therapeutic activities;Patient/family education   PT Goals (Current goals can be found in the Care Plan section) Acute Rehab PT Goals Patient Stated Goal: be able to return to work PT Goal Formulation: With patient Time For Goal Achievement: 11/27/13 Potential to Achieve Goals: Good    Frequency 7X/week   Barriers to discharge        Co-evaluation               End of Session Equipment Utilized During Treatment: Gait belt;Left knee immobilizer Activity Tolerance: Patient tolerated treatment well Patient left: in bed;with call bell/phone within reach;with family/visitor present Nurse Communication: Other (comment) (bleeding from incision site)         Time: [id number] PT Time Calculation (min): 21 min   Charges:   PT Evaluation $Initial PT Evaluation Tier I: 1 Procedure PT Treatments $Gait Training: 8-22 mins   PT G CodesBufford Lope:          Avrey Hyser 11/22/2013, 1:28 PM Bufford LopeLaura Ferdinand Revoir SPT 11/22/2013

## 2013-11-22 NOTE — Evaluation (Signed)
I have reviewed this note and agree with all findings. Kati Malyk Girouard, PT, DPT Pager: [phone]   

## 2013-11-22 NOTE — Progress Notes (Signed)
   Subjective: 1 Day Post-Op Procedure(s) (LRB): LEFT TOTAL KNEE  ARTHROPLASTY REVISION (Left) Patient reports pain as mild.   We will start therapy today.  Plan is to go Home after hospital stay.  Objective: Vital signs in last 24 hours: Temp:  [97.7 F (36.5 C)-98.7 F (37.1 C)] 98.7 F (37.1 C) (04/18 0624) Pulse Rate:  [63-80] 74 (04/18 0624) Resp:  [16-20] 16 (04/18 0624) BP: (103-133)/(68-80) 117/69 mmHg (04/18 0624) SpO2:  [95 %-99 %] 98 % (04/18 0624) Weight:  [207 lb 4.4 oz (94.02 kg)] 207 lb 4.4 oz (94.02 kg) (04/17 2100)  Intake/Output from previous day:  Intake/Output Summary (Last 24 hours) at 11/22/13 0809 Last data filed at 11/22/13 0630  Gross per 24 hour  Intake   3565 ml  Output   3220 ml  Net    345 ml    Intake/Output this shift:    Labs:  Recent Labs  11/22/13 0517  HGB 10.9*    Recent Labs  11/22/13 0517  WBC 9.2  RBC 3.37*  HCT 32.4*  PLT 134*    Recent Labs  11/22/13 0517  NA 138  K 5.0  CL 99  CO2 28  BUN 9  CREATININE 0.61  GLUCOSE 141*  CALCIUM 9.1   No results found for this basename: LABPT, INR,  in the last 72 hours  EXAM General - Patient is Alert, Appropriate and Oriented Extremity - Neurologically intact Neurovascular intact Compartment soft Dressing - dressing C/D/I Motor Function - intact, moving foot and toes well on exam.  Hemovac pulled without difficulty.  Past Medical History  Diagnosis Date  . Memory changes   . Deafness     Wears Bilat hearing aids since Childhood  . Obese   . Mild cognitive impairment, so stated 07/16/2013    Patient subjectively complains of word finding and memory deficits. MOCA 30-30 on 07-16-13 , paraphrasic errors- uses words such as  closet for garage   . Anxiety   . Depression   . Hyperlipidemia   . Arthritis   . OSA (obstructive sleep apnea)     uses BIPAP  . Glaucoma     Assessment/Plan: 1 Day Post-Op Procedure(s) (LRB): LEFT TOTAL KNEE  ARTHROPLASTY REVISION  (Left) Principal Problem:   Instability of internal left knee prosthesis   Advance diet Up with therapy D/C IV fluids Plan for discharge tomorrow  DVT Prophylaxis - Xarelto Weight-Bearing as tolerated to left leg D/C O2 and Pulse OX and try on Room [employer]  Britta Louth V Brinley Treanor 11/22/2013, 8:09 AM

## 2013-11-22 NOTE — Progress Notes (Signed)
CARE MANAGEMENT NOTE 11/22/2013  Patient:  Bethann PunchesSILVER,Jowana L   Account Number:  [id number]  Date Initiated:  11/22/2013  Documentation initiated by:  [facility]HAVIS,Aleyah Balik  Subjective/Objective Assessment:   LEFT TOTAL KNEE  ARTHROPLASTY REVISION     Action/Plan:   HH   Anticipated DC Date:  11/23/2013   Anticipated DC Plan:  HOME W HOME HEALTH SERVICES      DC Planning Services  CM consult      [facility]  [facility] HEALTH   Choice offered to / List presented to:  C-1 Patient        HH arranged  HH-2 PT      [facility]   Status of service:  Completed, signed off Medicare Important Message given?   (If response is "NO", the following Medicare IM given date fields will be blank) Date Medicare IM given:   Date Additional Medicare IM given:    Discharge Disposition:  HOME W HOME HEALTH SERVICES  Per UR Regulation:    If discussed at Long Length of Stay Meetings, dates discussed:    Comments:  11/22/2013 1530 NCM spoke to pt and [facility]. [facility]. Has RW at home. Isidoro DonningAlesia Kerria Sapien RN CCM Case Mgmt phone [phone]

## 2013-11-22 NOTE — Progress Notes (Signed)
I have reviewed this note and agree with all findings. Kati Calixto Pavel, PT, DPT Pager: [phone]   

## 2013-11-22 NOTE — Op Note (Signed)
[name], [name]                 ACCOUNT NO.:  [id number]  MEDICAL RECORD NO.:  [id number]  LOCATION:  1615                         FACILITY:  [facility]  PHYSICIAN:  Ollen Gross, M.D.    DATE OF BIRTH:  1955/08/10  DATE OF PROCEDURE:  11/21/2013 DATE OF DISCHARGE:                              OPERATIVE REPORT   PREOPERATIVE DIAGNOSIS:  Unstable left total knee arthroplasty.  POSTOPERATIVE DIAGNOSIS:  Unstable left total knee arthroplasty.  PROCEDURE:  Left total knee arthroplasty revision.  SURGEON:  Ollen Gross, M.D.  ASSISTANT:  Alexzandrew L. Perkins, PA-C.  ANESTHESIA:  Spinal.  ESTIMATED BLOOD LOSS:  Minimal.  DRAINS:  Hemovac x1.  TOURNIQUET TIME:  Up 42 minutes at 300 mmHg, down 10 minutes, up 27 minutes at 300 mmHg.  COMPLICATIONS:  None.  CONDITION:  Stable to recovery.  BRIEF CLINICAL NOTE:  [name] [name] is a 58 year old female, had bilateral total knee arthroplasty done several years ago.  Extremely well until started developing instability and pain in the left knee this past year.  Exam showed that she had become unstable.  X-rays were normal.  She failed bracing and presents now for polyethylene versus total knee revision.  PROCEDURE IN DETAIL:  After successful administration of spinal anesthetic, a tourniquet was placed high on her left thigh and left lower extremity, prepped and draped in usual sterile fashion.  Extremity was wrapped in Esmarch, knee flexed, tourniquet inflated to 300 mmHg. Midline incision was made with a 10-blade through the subcutaneous tissue to the level of the extensor mechanism.  Fresh blade was used make a medial parapatellar arthrotomy.  Soft tissue on the proximal medial tibia, subperiosteally elevated to the joint line with a knife and into the semimembranosus bursa with a Cobb elevator.  Soft tissue laterally was elevated with attention being paid to avoid the patellar tendon on tibial tubercle.  We entered the joint.  We did  not encounter fluid.  She did have some metal-stained synovium.  We did a thorough synovectomy.  I was then able to dislocate the tibia forward and removed the tibial polyethylene.  The components were in excellent position, but I did note that I was able to place an osteotome at the interface between the tibial component and cement, and easily removed the tibial component consistent with loose component.  I thus decided to perform a full-blown revision.  We then disrupted the interface between the femoral component and bone utilizing osteotomes.  The femoral components were removed with minimal of any bone loss.  The cement was removed off the femur.  I took the cement plug out of the tibia and then gained access to the tibial canal.  We thoroughly irrigated both canals and reamed the tibia up to 13 mm for 13 mm stem.  On the femoral side, I reamed to 14 mm, which was a tight press-fit.  The tibia was then subluxed forward and retractors placed. Extramedullary tibial alignment guide was placed referencing proximally at the medial aspect of the tibial tubercle and distally along the second metatarsal axis and tibial crest.  Block was pinned to remove about 2 mm off the proximal tibia.  Resection was made  with an oscillating saw to healthy-appearing bone.  We then did the proximal reaming and reamed with the 13 x 30 extension on.  This was through a 2.5 tibial baseplate.  I then prepare for 29-mm sleeve.  The trial was a 2.5 MBT revision tray with a 29 sleeve and 5-mm medial and lateral augments.  We then prepared the femur.  A 14-mm reamer was placed in the canal, which had a tight fit.  The 5-degree left valgus alignment guide was placed and the block pinned to remove 2 mm off the femur.  We had to go into +4 position, thus we need 4-mm medial and lateral augments distally.  Size 2.5 was the most appropriate femur.  The 2.5 cutting block was placed in the +2 position effectively  raising the stem and lowering the anterior flange of the femur down to the anterior cortex. The rotation was marked at the epicondylar axis and also confirmed by creating a rectangular flexion gap at 90 degrees utilizing spacer blocks.  The block was pinned in this rotation and anterior-posterior and chamfer cuts made .  To get more flexion stability, decided to had 4- mm posterior augments medial and lateral.  We then placed the intercondylar block to make TC3 cut.  The trial femur was placed, which was a size 2.5 TC3 femur with a 14 x 75 stem extension in the +2 position, 5 degrees of valgus with 4-mm medial and lateral distal augments and 4-mm medial and lateral posterior augments.  The tibial trial was a size 2.5 MBT revision tray with 5-mm medial and lateral augments and 29-mm sleeve.  The thickness of the insert to gain stability was 20 mm.  Full extension was achieved with excellent varus-valgus anterior-posterior balance throughout full range of motion.  Patella was again everted.  Tissues were removed off the patella essentially performing a patelloplasty.  The components in good position, it is not worn and is well fixed.  I decided to leave the patellar component intact.  Given that we had a 20-mm thick insert, decided to utilize 10-mm augments on the tibial tray, medial and lateral, so would not to use as thick in insert.  The tourniquet was then released for total time of 42 minutes.  This was held down for 10 minutes while the components were assembled on the back table.  Minor bleeding was identified and stopped with electrocautery. Once the components were ready, then the leg was rewrapped in Esmarch and tourniquet reinflated to 300 mmHg.  I trialed the cement restrictor, which ended up being a size 4.  The size 4 restrictor was placed at the appropriate depth in the tibial canal.  The cut bone surfaces were thoroughly prepared with pulsatile lavage.  Cement was mixed,  which was 3 batches of gentamicin impregnated cement.  Once ready for implantation, it was injected into the tibial canal and the tibial component was impacted and all extruded cement removed.  Once again, the tibial component was a size 2.5 MBT revision tray with 10-mm medial and lateral augment, 29 sleeve and 13 x 30 stem extension.  On the femoral side, we cemented distally and had a press-fit stem.  This was also impacted and extruded cement removed.  Trial 15-mm inserts placed, knee held in full extension and all extruded cement removed.  There was fantastic stability to varus and valgus stress in full extension and fantastic AP stability throughout full range of motion.  The patella tracked normally.  The extruded cement  was removed and trial insert removed.  Permanent 15-mm TC3 insert was placed into the tibial tray. It was noted that there was a small separation on the lateral femoral epicondyle and I placed a threaded Steinmann pin into this, which fixed it and kept it stable for full range of motion.  The pin was then cut and was flush with the surface of the femur, thus nothing was protruding.  The wound was then copiously irrigated with saline solution.  A 20 mL of Exparel with 30 mL of saline were injected into the periosteum of the femur and the extensor mechanism of subcu tissues. An additional separate injection of 20 mL of 0.25% Marcaine was injected into the same tissues.  The arthrotomy was then closed over Hemovac drain with a running #1 V-Loc suture.  Flexion against gravity was degrees with the patella tracking normally.  Tourniquet was released, the second tourniquet time of 27 minutes.  Subcu was closed with interrupted 2-0 Vicryl and subcuticular running 4-0 Monocryl.  Incision was cleaned and dried, and Steri-Strips and a bulky sterile dressing applied.  Drains hooked to suction and the patient was subsequently awakened and transported to recovery in stable  condition.  Note that a surgical assistant was a medical necessity for this procedure to perform it in a safe and expeditious manner.  Surgical assistant was necessary for retraction of vital ligaments and neurovascular structures and for safe removal of the old prosthesis, and safe and accurate placement of the new prosthesis.     Ollen GrossFrank Seniyah Esker, M.D.     FA/MEDQ  D:  11/21/2013  T:  11/22/2013  Job:  [id number]

## 2013-11-22 NOTE — Progress Notes (Signed)
Pt having some bleeding coming through lateral side of dsg, bed pad needing to be changed twice. Notified S. Flowers, PA, prior to giving xarelto. He said to go ahead with med & to reinforce dsg prn. [employer]

## 2013-11-22 NOTE — Evaluation (Signed)
Occupational Therapy Evaluation Patient Details Name: [name] [name] MRN: [id number] DOB: July 11, 1956 Today's Date: 11/22/2013    History of Present Illness Pt is a 58 year old female s/p L TKA revision; PMH bilateral TKA 2008, hearing aids bilaterally (since childhood), OSA, obesity   Clinical Impression   Pt presents with impaired balance and dependence in ADL transfers and LB ADL. Will follow to instruct in use of AE.  Discussed safety with showering and recommendation that pt sit.  Pt would prefer to purchase her own shower seat.    Follow Up Recommendations  No OT follow up    Equipment Recommendations  None recommended by OT (Pt declines a 3 in 1.)    Recommendations for Other Services       Precautions / Restrictions Precautions Precautions: Knee Required Braces or Orthoses: Knee Immobilizer - Left Knee Immobilizer - Left: Discontinue once straight leg raise with < 10 degree lag Restrictions Weight Bearing Restrictions: Yes LLE Weight Bearing: Weight bearing as tolerated      Mobility Bed Mobility Overal bed mobility: Needs Assistance Bed Mobility: Supine to Sit;Sit to Supine     Supine to sit: Supervision Sit to supine: Supervision   General bed mobility comments: verbal cues for LE managment and safety; assist required for L LE repositioning  Transfers Overall transfer level: Needs assistance Equipment used: Rolling walker (2 wheeled) Transfers: Sit to/from Stand Sit to Stand: Min guard         General transfer comment: verbal cues for safety and LE positioning; pt demonstrated recall of LE positioning for transfers    Balance                                            ADL Overall ADL's : Needs assistance/impaired Eating/Feeding: Independent;Bed level           Lower Body Bathing: Minimal assistance;Sit to/from stand       Lower Body Dressing: Minimal assistance;Sit to/from stand                 General ADL  Comments: Pt reports she has been using the regular height toilet with grab bar beside without difficulty. Prefers a shower seat to a 3 in 1, will purchase in the community.  Recommended long handled sponge.  Educated in safe footwear, use of sock aide and reacher for LB ADL.     Vision                     Perception     Praxis      Pertinent Vitals/Pain 6/10, RN provided pain meds, repositioned     Hand Dominance Right   Extremity/Trunk Assessment Upper Extremity Assessment Upper Extremity Assessment: Overall WFL for tasks assessed   Lower Extremity Assessment Lower Extremity Assessment: Defer to PT evaluation LLE Deficits / Details: fair quad contraction, unable to perform SLR, ROM to be assessed next visit; bleeding through bandage from surgical site, RN notified   Cervical / Trunk Assessment Cervical / Trunk Assessment: Normal   Communication Communication Communication: No difficulties   Cognition Arousal/Alertness: Awake/alert Behavior During Therapy: WFL for tasks assessed/performed Overall Cognitive Status: Within Functional Limits for tasks assessed                     General Comments       Exercises  Shoulder Instructions      Home Living Family/patient expects to be discharged to:: Private residence Living Arrangements: Spouse/significant other Available Help at Discharge: Family;Friend(s);Available PRN/intermittently Type of Home: House Home Access: Stairs to enter Entergy CorporationEntrance Stairs-Number of Steps: 4 Entrance Stairs-Rails: Right Home Layout: One level     Bathroom Shower/Tub: Producer, television/film/videoWalk-in shower   Bathroom Toilet: Standard     Home Equipment: Environmental consultantWalker - 2 wheels;Grab bars - toilet;Adaptive equipment Adaptive Equipment: Reacher        Prior Functioning/Environment Level of Independence: Independent             OT Diagnosis: Generalized weakness;Hemiplegia non-dominant side   OT Problem List: Decreased  strength;Decreased activity tolerance;Impaired balance (sitting and/or standing);Decreased knowledge of use of DME or AE;Obesity;Pain   OT Treatment/Interventions: Self-care/ADL training;DME and/or AE instruction;Patient/family education;Balance training    OT Goals(Current goals can be found in the care plan section) Acute Rehab OT Goals Patient Stated Goal: be able to return to work OT Goal Formulation: With patient Time For Goal Achievement: 11/29/13 Potential to Achieve Goals: Good ADL Goals Pt Will Perform Grooming: with supervision;standing Pt Will Perform Lower Body Dressing: with supervision;sit to/from stand;with adaptive equipment Pt Will Transfer to Toilet: with supervision;ambulating;regular height toilet;grab bars Pt Will Perform Tub/Shower Transfer: with supervision;ambulating;shower seat Additional ADL Goal #1: Pt will be knowledgeable in use of long handled sponge for LB ADL.  OT Frequency: Min 2X/week   Barriers to D/C:            Co-evaluation              End of Session CPM Left Knee CPM Left Knee: On  Activity Tolerance: Patient limited by pain Patient left: in bed;in CPM;with call bell/phone within reach;with family/visitor present   Time: 1455-1520 OT Time Calculation (min): 25 min Charges:  OT General Charges $OT Visit: 1 Procedure OT Evaluation $Initial OT Evaluation Tier I: 1 Procedure G-Codes:    Dayton BailiffJulie Lynn Asani Mcburney 11/22/2013, 3:41 PM

## 2013-11-22 NOTE — Progress Notes (Signed)
Physical Therapy Treatment   11/22/13 1509  PT Visit Information  Last PT Received On 11/22/13  Assistance Needed +1  History of Present Illness Pt is a 58 year old female s/p L TKA revision; PMH bilateral TKA 2008, hearing aids bilaterally (since childhood), OSA, obesity  PT Time Calculation  PT Start Time 1416  PT Stop Time 1444  PT Time Calculation (min) 28 min  Subjective Data  Subjective Pt tolerated ambulation well with increased distance.  Performed exercises for LE strengthening, increased pain with knee flexion.  Pt premedicated, activity to tolerance.  Pt plans to discharge tomorrow.  Patient Stated Goal be able to return to work  Precautions  Precautions Knee  Required Braces or Orthoses Knee Immobilizer - Left  Knee Immobilizer - Left Discontinue once straight leg raise with < 10 degree lag  Restrictions  Weight Bearing Restrictions Yes  LLE Weight Bearing WBAT  Cognition  Arousal/Alertness Awake/alert  Behavior During Therapy WFL for tasks assessed/performed  Overall Cognitive Status Within Functional Limits for tasks assessed  Bed Mobility  Overal bed mobility Needs Assistance  Bed Mobility Supine to Sit;Sit to Supine  Supine to sit HOB elevated;Min assist  Sit to supine Supervision  General bed mobility comments verbal cues for LE managment and safety; assist required for L LE repositioning  Transfers  Overall transfer level Needs assistance  Equipment used Rolling walker (2 wheeled)  Transfers Sit to/from Stand  Sit to Stand Min guard  General transfer comment verbal cues for safety and LE positioning; pt demonstrated recall of LE positioning for transfers  Ambulation/Gait  Ambulation/Gait assistance Min guard  Ambulation Distance (Feet) 100 Feet  Assistive device Rolling walker (2 wheeled)  Gait Pattern/deviations Step-to pattern;Decreased stride length;Antalgic  Gait velocity decr  General Gait Details verbal cues for sequencing, heel-to-toe pattern, and  increasing knee flexion  Total Joint Exercises  Ankle Circles/Pumps AROM;Both;20 reps;Seated  Quad Sets AROM;Left;10 reps;Seated  Towel Squeeze AROM;Both;10 reps  Short Arc Quad 10 reps;Seated;AROM  Heel Slides AAROM;Left;10 reps;Seated  Hip ABduction/ADduction AROM;Left;10 reps;Seated  Goniometric ROM Supine; L knee AAROM lacking 17 deg of extension; 60 degrees of flexion  PT - End of Session  Equipment Utilized During Treatment Gait belt;Left knee immobilizer  Activity Tolerance Patient tolerated treatment well  Patient left in bed;with call bell/phone within reach;with family/visitor present  PT - Assessment/Plan  PT Plan Current plan remains appropriate  PT Frequency 7X/week  Follow Up Recommendations Home health PT  PT equipment None recommended by PT  Acute Rehab PT Goals  PT Goal Formulation With patient  Time For Goal Achievement 11/27/13  Potential to Achieve Goals Good  PT General Charges  $$ ACUTE PT VISIT 1 Procedure  PT Treatments  $Gait Training 8-22 mins  $Therapeutic Exercise 8-22 mins    Bufford LopeLaura Adael Culbreath SPT 11/22/2013

## 2013-11-23 LAB — CBC
HCT: 28.6 % — ABNORMAL LOW (ref 36.0–46.0)
Hemoglobin: 9.6 g/dL — ABNORMAL LOW (ref 12.0–15.0)
MCH: 32.2 pg (ref 26.0–34.0)
MCHC: 33.6 g/dL (ref 30.0–36.0)
MCV: 96 fL (ref 78.0–100.0)
Platelets: 194 10*3/uL (ref 150–400)
RBC: 2.98 MIL/uL — ABNORMAL LOW (ref 3.87–5.11)
RDW: 12.3 % (ref 11.5–15.5)
WBC: 10.5 10*3/uL (ref 4.0–10.5)

## 2013-11-23 LAB — BASIC METABOLIC PANEL
BUN: 15 mg/dL (ref 6–23)
CHLORIDE: 101 meq/L (ref 96–112)
CO2: 28 meq/L (ref 19–32)
CREATININE: 0.61 mg/dL (ref 0.50–1.10)
Calcium: 9 mg/dL (ref 8.4–10.5)
GFR calc non Af Amer: >90 mL/min (ref >90)
Glucose, Bld: 111 mg/dL — ABNORMAL HIGH (ref 70–99)
Potassium: 4.2 mEq/L (ref 3.7–5.3)
Sodium: 138 mEq/L (ref 137–147)

## 2013-11-23 NOTE — Progress Notes (Signed)
Pt stable, scripts, d/c instructions given with no questions/concerns voiced by pt or family.  Pt transported via wheelchair to private vehicle by NT and wife.

## 2013-11-23 NOTE — Progress Notes (Signed)
Physical Therapy Treatment Patient Details Name: [name] [name] MRN: [id number] DOB: 11/15/1955 Today's Date: 11/23/2013    History of Present Illness Pt is a 58 year old female s/p L TKA revision; PMH bilateral TKA 2008, hearing aids bilaterally (since childhood), OSA, obesity    PT Comments    Pt did well with stairs and plan is to d/c home today.  Follow Up Recommendations  Home health PT     Equipment Recommendations  None recommended by PT    Recommendations for Other Services       Precautions / Restrictions Precautions Precautions: Knee Required Braces or Orthoses: Knee Immobilizer - Left Knee Immobilizer - Left: Discontinue once straight leg raise with < 10 degree lag Restrictions LLE Weight Bearing: Weight bearing as tolerated    Mobility  Bed Mobility   Bed Mobility: Supine to Sit     Supine to sit: Modified independent (Device/Increase time) Sit to supine: Supervision   General bed mobility comments:  (Pt with good use of UE to A.)  Transfers   Equipment used: Rolling walker (2 wheeled) Transfers: Sit to/from Stand Sit to Stand: Min guard         General transfer comment:  (cues for hand placement.)  Ambulation/Gait Ambulation/Gait assistance: Min guard;Supervision Ambulation Distance (Feet): 120 Feet Assistive device: Rolling walker (2 wheeled) Gait Pattern/deviations: Step-to pattern Gait velocity: decr   General Gait Details:  (cues for proper technique)   Stairs Stairs: Yes Stairs assistance: Min guard Stair Management: Two rails;Step to pattern;Forwards Number of Stairs: 5 General stair comments: Pt with good recall of proper technique from previous surgery.  Wheelchair Mobility    Modified Rankin (Stroke Patients Only)       Balance                                    Cognition Arousal/Alertness: Awake/alert Behavior During Therapy: WFL for tasks assessed/performed Overall Cognitive Status: Within  Functional Limits for tasks assessed                      Exercises Total Joint Exercises Ankle Circles/Pumps: AROM;Both;20 reps;Supine Quad Sets: Strengthening;Left;10 reps;Supine Heel Slides: Supine;AAROM;10 reps Hip ABduction/ADduction: AROM;Left;10 reps;Supine    General Comments        Pertinent Vitals/Pain 6/10 with ROM THer ex    Home Living                      Prior Function            PT Goals (current goals can now be found in the care plan section) Acute Rehab PT Goals PT Goal Formulation: With patient Potential to Achieve Goals: Good Progress towards PT goals: Progressing toward goals    Frequency  7X/week    PT Plan Current plan remains appropriate    Co-evaluation             End of Session Equipment Utilized During Treatment: Gait belt;Left knee immobilizer Activity Tolerance: Patient tolerated treatment well Patient left: in bed;with call bell/phone within reach;with family/visitor present     Time: [id number] PT Time Calculation (min): 24 min  Charges:  $Gait Training: 8-22 mins $Therapeutic Exercise: 8-22 mins                    G Codes:      [name] [name] 11/23/2013, 10:04 AM

## 2013-11-23 NOTE — Progress Notes (Signed)
Patient ID: [name] [name], female   DOB: January 08, 1956, 58 y.o.   MRN: [id number] Subjective: 2 Days Post-Op Procedure(s) (LRB): LEFT TOTAL KNEE  ARTHROPLASTY REVISION (Left)    Patient reports pain as mild.  Ready to go home.  "CPM hurts"  Objective:   VITALS:   Filed Vitals:   11/23/13 0520  BP: 124/79  Pulse: 72  Temp: 97.9 F (36.6 C)  Resp: 20    Neurologically intact Incision: dressing C/D/I  LABS  Recent Labs  11/22/13 0517 11/23/13 0523  HGB 10.9* 9.6*  HCT 32.4* 28.6*  WBC 9.2 10.5  PLT 134* 194     Recent Labs  11/22/13 0517 11/23/13 0523  NA 138 138  K 5.0 4.2  BUN 9 15  CREATININE 0.61 0.61  GLUCOSE 141* 111*    No results found for this basename: LABPT, INR,  in the last 72 hours   Assessment/Plan: 2 Days Post-Op Procedure(s) (LRB): LEFT TOTAL KNEE  ARTHROPLASTY REVISION (Left)   Discharge home with home health today

## 2013-11-24 ENCOUNTER — Encounter (HOSPITAL_COMMUNITY): Payer: Self-pay | Admitting: Orthopedic Surgery

## 2013-12-01 ENCOUNTER — Ambulatory Visit (HOSPITAL_COMMUNITY): Payer: Self-pay | Admitting: Psychiatry

## 2013-12-08 ENCOUNTER — Ambulatory Visit (INDEPENDENT_AMBULATORY_CARE_PROVIDER_SITE_OTHER): Payer: 59 | Admitting: Psychiatry

## 2013-12-08 ENCOUNTER — Encounter (HOSPITAL_COMMUNITY): Payer: Self-pay | Admitting: Psychiatry

## 2013-12-08 DIAGNOSIS — F339 Major depressive disorder, recurrent, unspecified: Secondary | ICD-10-CM

## 2013-12-08 DIAGNOSIS — F329 Major depressive disorder, single episode, unspecified: Secondary | ICD-10-CM

## 2013-12-08 MED ORDER — LAMOTRIGINE 100 MG PO TABS: ORAL_TABLET | ORAL | Status: DC

## 2013-12-08 MED ORDER — BUPROPION HCL ER (XL) 150 MG PO TB24: 150.0000 mg | ORAL_TABLET | Freq: Every morning | ORAL | Status: DC

## 2013-12-08 MED ORDER — VENLAFAXINE HCL ER 150 MG PO CP24: 150.0000 mg | ORAL_CAPSULE | Freq: Two times a day (BID) | ORAL | Status: DC

## 2013-12-08 NOTE — Progress Notes (Signed)
[facility] Progress note   [name] [id number] 58 y.o.  12/08/2013 4:26 PM  Chief Complaint:  Medication management and followup.      History of Present Illness:  [name] [name] came for her followup appointment.  She recently had knee surgery and she is using a walker .  She still had pain but overall she is happy with the surgery.  She is taking pain medication and hoping to stop using it in the future.  She's not taking Xanax and she has cut down her Ambien significantly from the past.  She still has Ambien remaining.  She is sleeping better.  She denies any irritability, anger, mood swing.  She has a rash or itching.  She denies any severe panic attack.  She was to continue her current psychotropic medication.  She continues to see [facility] for counseling .  She is not drinking or using any illegal substances.  Suicidal Ideation: No Plan Formed: No Patient has means to carry out plan: No  Homicidal Ideation: No Plan Formed: No Patient has means to carry out plan: No  Past Psychiatric History/Hospitalization(s) Patient has one psychiatric hospitalization in 2010 when her brother committed suicide.  She was admitted at behavioral [facility].  She's been taking antidepressant since 2008.  She had tried Prozac and Paxil .  Prozac made her more manic and Paxil did not help.  She also tried recently Abilify which causes headache and diarrhea.  She denies any history of suicidal attempt but admitted passive suicidal thoughts.  She denies any history of mania, psychosis, depression or any violence.   Anxiety: Yes Bipolar Disorder: No Depression: Yes Mania: No Psychosis: No Schizophrenia: No Personality Disorder: No Hospitalization for psychiatric illness: Yes History of Electroconvulsive Shock Therapy: No Prior Suicide Attempts: No  Medical History; Patient has hearing impairment, mild cognitive impairment, knee pain, obstructive sleep apnea, joint pain and obesity.   Her primary care physician is Dr. Pearletha Alfred at [facility].  Her neurologist is Dr Erenest Rasher.   Review of Systems: Psychiatric: Agitation: No Hallucination: No Depressed Mood: No Insomnia: No Hypersomnia: No Altered Concentration: No Feels Worthless: No Grandiose Ideas: No Belief In Special Powers: No New/Increased Substance Abuse: No Compulsions: No  Neurologic: Headache: No Seizure: No Paresthesias: No Review of Systems  HENT: Positive for hearing loss.       Outpatient Encounter Prescriptions as of 12/08/2013  Medication Sig  . buPROPion (WELLBUTRIN XL) 150 MG 24 hr tablet Take 1 tablet (150 mg total) by mouth every morning.  . lamoTRIgine (LAMICTAL) 100 MG tablet TAKE 1 TABLET BY MOUTH DAILY  . oxyCODONE (OXY IR/ROXICODONE) 5 MG immediate release tablet Take 1-2 tablets (5-10 mg total) by mouth every 3 (three) hours as needed for breakthrough pain.  Marland Kitchen venlafaxine XR (EFFEXOR-XR) 150 MG 24 hr capsule Take 1 capsule (150 mg total) by mouth 2 (two) times daily.  Marland Kitchen zolpidem (AMBIEN) 10 MG tablet Take 10 mg by mouth at bedtime as needed for sleep.  . [DISCONTINUED] buPROPion (WELLBUTRIN XL) 150 MG 24 hr tablet Take 150 mg by mouth every morning.  . [DISCONTINUED] lamoTRIgine (LAMICTAL) 100 MG tablet TAKE 1 TABLET BY MOUTH DAILY  . [DISCONTINUED] venlafaxine XR (EFFEXOR-XR) 150 MG 24 hr capsule Take 150 mg by mouth 2 (two) times daily.   . methocarbamol (ROBAXIN) 500 MG tablet Take 1 tablet (500 mg total) by mouth every 6 (six) hours as needed for muscle spasms.  . mupirocin nasal ointment (BACTROBAN) 2 %  Place 1 application into the nose 2 (two) times daily. Use one-half of tube in each nostril twice daily for five (5) days. After application, press sides of nose together and gently massage.  Marland Kitchen omega-3 acid ethyl esters (LOVAZA) 1 G capsule Take 1 g by mouth daily.  . rivaroxaban (XARELTO) 10 MG TABS tablet Take 1 tablet (10 mg total) by mouth daily with breakfast.  .  travoprost, benzalkonium, (TRAVATAN) 0.004 % ophthalmic solution Place 1 drop into both eyes at bedtime.   . [DISCONTINUED] ALPRAZolam (XANAX) 0.5 MG tablet Take 0.5 mg by mouth daily. As needed    Recent Results (from the past 2160 hour(s))  URINALYSIS, ROUTINE W REFLEX MICROSCOPIC     Status: None   Collection Time    11/18/13  9:28 AM      Result Value Ref Range   Color, Urine YELLOW  YELLOW   APPearance CLEAR  CLEAR   Specific Gravity, Urine 1.007  1.005 - 1.030   pH 6.5  5.0 - 8.0   Glucose, UA NEGATIVE  NEGATIVE mg/dL   Hgb urine dipstick NEGATIVE  NEGATIVE   Bilirubin Urine NEGATIVE  NEGATIVE   Ketones, ur NEGATIVE  NEGATIVE mg/dL   Protein, ur NEGATIVE  NEGATIVE mg/dL   Urobilinogen, UA 0.2  0.0 - 1.0 mg/dL   Nitrite NEGATIVE  NEGATIVE   Leukocytes, UA NEGATIVE  NEGATIVE   Comment: MICROSCOPIC NOT DONE ON URINES WITH NEGATIVE PROTEIN, BLOOD, LEUKOCYTES, NITRITE, OR GLUCOSE <1000 mg/dL.  SURGICAL PCR SCREEN     Status: Abnormal   Collection Time    11/18/13  9:28 AM      Result Value Ref Range   MRSA, PCR NEGATIVE  NEGATIVE   Staphylococcus aureus POSITIVE (*) NEGATIVE   Comment:            The Xpert SA Assay (FDA     approved for NASAL specimens     in patients over 66 years of age),     is one component of     a comprehensive surveillance     program.  Test performance has     been validated by [employer] for patients greater     than or equal to 23 year old.     It is not intended     to diagnose infection nor to     guide or monitor treatment.  APTT     Status: None   Collection Time    11/18/13  9:30 AM      Result Value Ref Range   aPTT 30  24 - 37 seconds  CBC     Status: None   Collection Time    11/18/13  9:30 AM      Result Value Ref Range   WBC 6.1  4.0 - 10.5 K/uL   RBC 3.90  3.87 - 5.11 MIL/uL   Hemoglobin 12.5  12.0 - 15.0 g/dL   HCT 37.8  36.0 - 46.0 %   MCV 96.9  78.0 - 100.0 fL   MCH 32.1  26.0 - 34.0 pg   MCHC 33.1  30.0 - 36.0  g/dL   RDW 12.2  11.5 - 15.5 %   Platelets 206  150 - 400 K/uL  COMPREHENSIVE METABOLIC PANEL     Status: None   Collection Time    11/18/13  9:30 AM      Result Value Ref Range   Sodium 141  137 - 147 mEq/L  Potassium 5.1  3.7 - 5.3 mEq/L   Chloride 101  96 - 112 mEq/L   CO2 29  19 - 32 mEq/L   Glucose, Bld 92  70 - 99 mg/dL   BUN 13  6 - 23 mg/dL   Creatinine, Ser 0.66  0.50 - 1.10 mg/dL   Calcium 10.1  8.4 - 10.5 mg/dL   Total Protein 7.6  6.0 - 8.3 g/dL   Albumin 4.0  3.5 - 5.2 g/dL   AST 25  0 - 37 U/L   ALT 27  0 - 35 U/L   Alkaline Phosphatase 55  39 - 117 U/L   Total Bilirubin 0.3  0.3 - 1.2 mg/dL   GFR calc non Af Amer >90  >90 mL/min   GFR calc Af Amer >90  >90 mL/min   Comment: (NOTE)     The eGFR has been calculated using the CKD EPI equation.     This calculation has not been validated in all clinical situations.     eGFR's persistently <90 mL/min signify possible Chronic Kidney     Disease.  PROTIME-INR     Status: None   Collection Time    11/18/13  9:30 AM      Result Value Ref Range   Prothrombin Time 12.7  11.6 - 15.2 seconds   INR 0.97  0.00 - 1.49  TYPE AND SCREEN     Status: None   Collection Time    11/18/13  9:30 AM      Result Value Ref Range   ABO/RH(D) A POS     Antibody Screen NEG     Sample Expiration 11/24/2013    CBC     Status: Abnormal   Collection Time    11/22/13  5:17 AM      Result Value Ref Range   WBC 9.2  4.0 - 10.5 K/uL   RBC 3.37 (*) 3.87 - 5.11 MIL/uL   Hemoglobin 10.9 (*) 12.0 - 15.0 g/dL   HCT 32.4 (*) 36.0 - 46.0 %   MCV 96.1  78.0 - 100.0 fL   MCH 32.3  26.0 - 34.0 pg   MCHC 33.6  30.0 - 36.0 g/dL   RDW 12.0  11.5 - 15.5 %   Platelets 134 (*) 150 - 400 K/uL  BASIC METABOLIC PANEL     Status: Abnormal   Collection Time    11/22/13  5:17 AM      Result Value Ref Range   Sodium 138  137 - 147 mEq/L   Potassium 5.0  3.7 - 5.3 mEq/L   Chloride 99  96 - 112 mEq/L   CO2 28  19 - 32 mEq/L   Glucose, Bld 141 (*) 70 -  99 mg/dL   BUN 9  6 - 23 mg/dL   Creatinine, Ser 0.61  0.50 - 1.10 mg/dL   Calcium 9.1  8.4 - 10.5 mg/dL   GFR calc non Af Amer >90  >90 mL/min   GFR calc Af Amer >90  >90 mL/min   Comment: (NOTE)     The eGFR has been calculated using the CKD EPI equation.     This calculation has not been validated in all clinical situations.     eGFR's persistently <90 mL/min signify possible Chronic Kidney     Disease.  GLUCOSE, CAPILLARY     Status: Abnormal   Collection Time    11/22/13  7:47 AM      Result Value Ref Range  Glucose-Capillary 155 (*) 70 - 99 mg/dL  CBC     Status: Abnormal   Collection Time    11/23/13  5:23 AM      Result Value Ref Range   WBC 10.5  4.0 - 10.5 K/uL   RBC 2.98 (*) 3.87 - 5.11 MIL/uL   Hemoglobin 9.6 (*) 12.0 - 15.0 g/dL   HCT 28.6 (*) 36.0 - 46.0 %   MCV 96.0  78.0 - 100.0 fL   MCH 32.2  26.0 - 34.0 pg   MCHC 33.6  30.0 - 36.0 g/dL   RDW 12.3  11.5 - 15.5 %   Platelets 194  150 - 400 K/uL   Comment: DELTA CHECK NOTED     REPEATED TO VERIFY  BASIC METABOLIC PANEL     Status: Abnormal   Collection Time    11/23/13  5:23 AM      Result Value Ref Range   Sodium 138  137 - 147 mEq/L   Potassium 4.2  3.7 - 5.3 mEq/L   Chloride 101  96 - 112 mEq/L   CO2 28  19 - 32 mEq/L   Glucose, Bld 111 (*) 70 - 99 mg/dL   BUN 15  6 - 23 mg/dL   Creatinine, Ser 0.61  0.50 - 1.10 mg/dL   Calcium 9.0  8.4 - 10.5 mg/dL   GFR calc non Af Amer >90  >90 mL/min   GFR calc Af Amer >90  >90 mL/min   Comment: (NOTE)     The eGFR has been calculated using the CKD EPI equation.     This calculation has not been validated in all clinical situations.     eGFR's persistently <90 mL/min signify possible Chronic Kidney     Disease.      Physical Exam: Constitutional:  There were no vitals taken for this visit.  Musculoskeletal: Strength & Muscle Tone: within normal limits Gait & Station: normal Patient leans: N/A and The patient has a normal posture  Mental Status  Examination;  Patient is well groomed well dressed female who appears to be in her stated age.  She is pleasant and cooperative.  Her speech is clear and coherent.  She describes her mood is good and her affect is mood appropriate.  She denies any auditory or visual hallucination.  She denies any active or passive suicidal thoughts or homicidal thoughts. There were no flight of ideas or any looseness session.  Her form of knowledge is adequate.  Her attention and concentration is fair.  She has no tremors or shakes.  There were no paranoia, delusions or any obsessive thoughts at this time.  She's alert and oriented x3.  Her insight judgment and pulse control is okay.     Established Problem, Stable/Improving (1), Review of Psycho-Social Stressors (1), Review of Last Therapy Session (1) and Review of Medication Regimen & Side Effects (2)  Assessment: Axis I: Maj. depressive disorder, recurrent  Axis II: Deferred  Axis III:  Past Medical History  Diagnosis Date  . Memory changes   . Deafness     Wears Bilat hearing aids since Childhood  . Obese   . Mild cognitive impairment, so stated 07/16/2013    Patient subjectively complains of word finding and memory deficits. MOCA 30-30 on 07-16-13 , paraphrasic errors- uses words such as  closet for garage   . Anxiety   . Depression   . Hyperlipidemia   . Arthritis   . OSA (obstructive sleep apnea)  uses BIPAP  . Glaucoma     Axis IV: Mild to moderate   Plan:  Patient is doing better on her current medication.  I will discontinue Xanax since patient has not taken .  She does not require a new position of Ambien.  I will continue Wellbutrin, Lamictal and Effexor at present dose.  Recommended to cause back if she has any question or a concern.  I will see her again in 3 months.    Kimberly Coye T., MD 12/08/2013

## 2013-12-18 ENCOUNTER — Other Ambulatory Visit (HOSPITAL_COMMUNITY): Payer: Self-pay | Admitting: Psychiatry

## 2013-12-18 NOTE — Discharge Summary (Signed)
Physician Discharge Summary   Patient ID: [name] [name] MRN: [id number] DOB/AGE: 1956/03/18 58 y.o.  Admit date: 11/21/2013 Discharge date: 11/23/2013  Primary Diagnosis:  Unstable left total knee arthroplasty.  Admission Diagnoses:  Past Medical History  Diagnosis Date  . Memory changes   . Deafness     Wears Bilat hearing aids since Childhood  . Obese   . Mild cognitive impairment, so stated 07/16/2013    Patient subjectively complains of word finding and memory deficits. MOCA 30-30 on 07-16-13 , paraphrasic errors- uses words such as  closet for garage   . Anxiety   . Depression   . Hyperlipidemia   . Arthritis   . OSA (obstructive sleep apnea)     uses BIPAP  . Glaucoma    Discharge Diagnoses:   Principal Problem:   Instability of internal left knee prosthesis  Estimated body mass index is 40.48 kg/(m^2) as calculated from the following:   Height as of this encounter: 5' (1.524 m).   Weight as of this encounter: 94.02 kg (207 lb 4.4 oz).  Procedure:  Procedure(s) (LRB): LEFT TOTAL KNEE  ARTHROPLASTY REVISION (Left)   Consults: None  HPI: [name] [name] is a 58 year old female, had bilateral total  knee arthroplasty done several years ago. Extremely well until started  developing instability and pain in the left knee this past year. Exam  showed that she had become unstable. X-rays were normal. She failed  bracing and presents now for polyethylene versus total knee revision.  Laboratory Data: Admission on 11/21/2013, Discharged on 11/23/2013  Component Date Value Ref Range Status  . WBC 11/22/2013 9.2  4.0 - 10.5 K/uL Final  . RBC 11/22/2013 3.37* 3.87 - 5.11 MIL/uL Final  . Hemoglobin 11/22/2013 10.9* 12.0 - 15.0 g/dL Final  . HCT 11/22/2013 32.4* 36.0 - 46.0 % Final  . MCV 11/22/2013 96.1  78.0 - 100.0 fL Final  . MCH 11/22/2013 32.3  26.0 - 34.0 pg Final  . MCHC 11/22/2013 33.6  30.0 - 36.0 g/dL Final  . RDW 11/22/2013 12.0  11.5 - 15.5 % Final  . Platelets  11/22/2013 134* 150 - 400 K/uL Final  . Sodium 11/22/2013 138  137 - 147 mEq/L Final  . Potassium 11/22/2013 5.0  3.7 - 5.3 mEq/L Final  . Chloride 11/22/2013 99  96 - 112 mEq/L Final  . CO2 11/22/2013 28  19 - 32 mEq/L Final  . Glucose, Bld 11/22/2013 141* 70 - 99 mg/dL Final  . BUN 11/22/2013 9  6 - 23 mg/dL Final  . Creatinine, Ser 11/22/2013 0.61  0.50 - 1.10 mg/dL Final  . Calcium 11/22/2013 9.1  8.4 - 10.5 mg/dL Final  . GFR calc non Af Amer 11/22/2013 >90  >90 mL/min Final  . GFR calc Af Amer 11/22/2013 >90  >90 mL/min Final   Comment: (NOTE)                          The eGFR has been calculated using the CKD EPI equation.                          This calculation has not been validated in all clinical situations.                          eGFR's persistently <90 mL/min signify possible Chronic Kidney  Disease.  . Glucose-Capillary 11/22/2013 155* 70 - 99 mg/dL Final  . WBC 11/23/2013 10.5  4.0 - 10.5 K/uL Final  . RBC 11/23/2013 2.98* 3.87 - 5.11 MIL/uL Final  . Hemoglobin 11/23/2013 9.6* 12.0 - 15.0 g/dL Final  . HCT 11/23/2013 28.6* 36.0 - 46.0 % Final  . MCV 11/23/2013 96.0  78.0 - 100.0 fL Final  . MCH 11/23/2013 32.2  26.0 - 34.0 pg Final  . MCHC 11/23/2013 33.6  30.0 - 36.0 g/dL Final  . RDW 11/23/2013 12.3  11.5 - 15.5 % Final  . Platelets 11/23/2013 194  150 - 400 K/uL Final   Comment: DELTA CHECK NOTED                          REPEATED TO VERIFY  . Sodium 11/23/2013 138  137 - 147 mEq/L Final  . Potassium 11/23/2013 4.2  3.7 - 5.3 mEq/L Final  . Chloride 11/23/2013 101  96 - 112 mEq/L Final  . CO2 11/23/2013 28  19 - 32 mEq/L Final  . Glucose, Bld 11/23/2013 111* 70 - 99 mg/dL Final  . BUN 11/23/2013 15  6 - 23 mg/dL Final  . Creatinine, Ser 11/23/2013 0.61  0.50 - 1.10 mg/dL Final  . Calcium 11/23/2013 9.0  8.4 - 10.5 mg/dL Final  . GFR calc non Af Amer 11/23/2013 >90  >90 mL/min Final  . GFR calc Af Amer 11/23/2013 >90  >90 mL/min Final     Comment: (NOTE)                          The eGFR has been calculated using the CKD EPI equation.                          This calculation has not been validated in all clinical situations.                          eGFR's persistently <90 mL/min signify possible Chronic Kidney                          Disease.  Hospital Outpatient Visit on 11/18/2013  Component Date Value Ref Range Status  . aPTT 11/18/2013 30  24 - 37 seconds Final  . WBC 11/18/2013 6.1  4.0 - 10.5 K/uL Final  . RBC 11/18/2013 3.90  3.87 - 5.11 MIL/uL Final  . Hemoglobin 11/18/2013 12.5  12.0 - 15.0 g/dL Final  . HCT 11/18/2013 37.8  36.0 - 46.0 % Final  . MCV 11/18/2013 96.9  78.0 - 100.0 fL Final  . MCH 11/18/2013 32.1  26.0 - 34.0 pg Final  . MCHC 11/18/2013 33.1  30.0 - 36.0 g/dL Final  . RDW 11/18/2013 12.2  11.5 - 15.5 % Final  . Platelets 11/18/2013 206  150 - 400 K/uL Final  . Sodium 11/18/2013 141  137 - 147 mEq/L Final  . Potassium 11/18/2013 5.1  3.7 - 5.3 mEq/L Final  . Chloride 11/18/2013 101  96 - 112 mEq/L Final  . CO2 11/18/2013 29  19 - 32 mEq/L Final  . Glucose, Bld 11/18/2013 92  70 - 99 mg/dL Final  . BUN 11/18/2013 13  6 - 23 mg/dL Final  . Creatinine, Ser 11/18/2013 0.66  0.50 - 1.10 mg/dL Final  . Calcium 11/18/2013 10.1  8.4 -  10.5 mg/dL Final  . Total Protein 11/18/2013 7.6  6.0 - 8.3 g/dL Final  . Albumin 11/18/2013 4.0  3.5 - 5.2 g/dL Final  . AST 11/18/2013 25  0 - 37 U/L Final  . ALT 11/18/2013 27  0 - 35 U/L Final  . Alkaline Phosphatase 11/18/2013 55  39 - 117 U/L Final  . Total Bilirubin 11/18/2013 0.3  0.3 - 1.2 mg/dL Final  . GFR calc non Af Amer 11/18/2013 >90  >90 mL/min Final  . GFR calc Af Amer 11/18/2013 >90  >90 mL/min Final   Comment: (NOTE)                          The eGFR has been calculated using the CKD EPI equation.                          This calculation has not been validated in all clinical situations.                          eGFR's persistently <90 mL/min  signify possible Chronic Kidney                          Disease.  Marland Kitchen Prothrombin Time 11/18/2013 12.7  11.6 - 15.2 seconds Final  . INR 11/18/2013 0.97  0.00 - 1.49 Final  . ABO/RH(D) 11/18/2013 A POS   Final  . Antibody Screen 11/18/2013 NEG   Final  . Sample Expiration 11/18/2013 11/24/2013   Final  . Color, Urine 11/18/2013 YELLOW  YELLOW Final  . APPearance 11/18/2013 CLEAR  CLEAR Final  . Specific Gravity, Urine 11/18/2013 1.007  1.005 - 1.030 Final  . pH 11/18/2013 6.5  5.0 - 8.0 Final  . Glucose, UA 11/18/2013 NEGATIVE  NEGATIVE mg/dL Final  . Hgb urine dipstick 11/18/2013 NEGATIVE  NEGATIVE Final  . Bilirubin Urine 11/18/2013 NEGATIVE  NEGATIVE Final  . Ketones, ur 11/18/2013 NEGATIVE  NEGATIVE mg/dL Final  . Protein, ur 11/18/2013 NEGATIVE  NEGATIVE mg/dL Final  . Urobilinogen, UA 11/18/2013 0.2  0.0 - 1.0 mg/dL Final  . Nitrite 11/18/2013 NEGATIVE  NEGATIVE Final  . Leukocytes, UA 11/18/2013 NEGATIVE  NEGATIVE Final   MICROSCOPIC NOT DONE ON URINES WITH NEGATIVE PROTEIN, BLOOD, LEUKOCYTES, NITRITE, OR GLUCOSE <1000 mg/dL.  Marland Kitchen MRSA, PCR 11/18/2013 NEGATIVE  NEGATIVE Final  . Staphylococcus aureus 11/18/2013 POSITIVE* NEGATIVE Final   Comment:                                 The Xpert SA Assay (FDA                          approved for NASAL specimens                          in patients over 72 years of age),                          is one component of                          a comprehensive surveillance  program.  Test performance has                          been validated by [facility] for patients greater                          than or equal to 17 year old.                          It is not intended                          to diagnose infection nor to                          guide or monitor treatment.     X-Rays:No results found.  EKG:No orders found for this or any previous visit.   Hospital Course: MADELIN WESEMAN is a 58 y.o. who was admitted to [facility]. They were brought to the operating room on 11/21/2013 and underwent Procedure(s): LEFT TOTAL KNEE  ARTHROPLASTY REVISION.  Patient tolerated the procedure well and was later transferred to the recovery room and then to the orthopaedic floor for postoperative care.  They were given PO and IV analgesics for pain control following their surgery.  They were given 24 hours of postoperative antibiotics of  Anti-infectives   Start     Dose/Rate Route Frequency Ordered Stop   11/22/13 0600  ceFAZolin (ANCEF) IVPB 2 g/50 mL premix     2 g 100 mL/hr over 30 Minutes Intravenous On call to O.R. 11/21/13 1223 11/21/13 1500   11/21/13 2100  ceFAZolin (ANCEF) IVPB 2 g/50 mL premix     2 g 100 mL/hr over 30 Minutes Intravenous Every 6 hours 11/21/13 1853 11/22/13 0300     and started on DVT prophylaxis in the form of Xarelto.   PT and OT were ordered for total joint protocol.  Discharge planning consulted to help with postop disposition and equipment needs.  Patient had a decent night on the evening of surgery.  They started to get up OOB with therapy on day one. Hemovac drain was pulled without difficulty.  Continued to work with therapy into day two.  Dressing was changed on day two and the incision was healing well.  Patient was seen in rounds and was ready to go home.   Diet: Cardiac diet Activity:WBAT Follow-up:in 2 weeks Disposition - Home Discharged Condition: good   Discharge Orders   Future Appointments Provider Department Dept Phone   01/21/2014 2:30 PM Larey Seat, MD [facility] [phone]   Future Orders Complete By Expires   Call MD / Call 911  As directed    Constipation Prevention  As directed    Diet - low sodium heart healthy  As directed    Discharge instructions  As directed    Increase activity slowly as tolerated  As directed        Medication List    STOP taking these medications        B-complex with vitamin C tablet     HYDROcodone-acetaminophen 5-325 MG per tablet  Commonly known as:  NORCO/VICODIN  multivitamin with minerals Tabs tablet      TAKE these medications       methocarbamol 500 MG tablet  Commonly known as:  ROBAXIN  Take 1 tablet (500 mg total) by mouth every 6 (six) hours as needed for muscle spasms.     mupirocin nasal ointment 2 %  Commonly known as:  BACTROBAN  Place 1 application into the nose 2 (two) times daily. Use one-half of tube in each nostril twice daily for five (5) days. After application, press sides of nose together and gently massage.     omega-3 acid ethyl esters 1 G capsule  Commonly known as:  LOVAZA  Take 1 g by mouth daily.     oxyCODONE 5 MG immediate release tablet  Commonly known as:  Oxy IR/ROXICODONE  Take 1-2 tablets (5-10 mg total) by mouth every 3 (three) hours as needed for breakthrough pain.     rivaroxaban 10 MG Tabs tablet  Commonly known as:  XARELTO  Take 1 tablet (10 mg total) by mouth daily with breakfast.     travoprost (benzalkonium) 0.004 % ophthalmic solution  Commonly known as:  TRAVATAN  Place 1 drop into both eyes at bedtime.     zolpidem 10 MG tablet  Commonly known as:  AMBIEN  Take 10 mg by mouth at bedtime as needed for sleep.           Follow-up Information   Follow up with Gearlean Alf, MD. Schedule an appointment as soon as possible for a visit on 12/02/2013. (Call [phone] Monday to make the appointment)    Specialty:  Orthopedic Surgery   Contact information:   [address] [place] [address] [place] [address] [phone]       Follow up with [facility])    Contact information:   [place] [address] [phone]       Follow up with Gearlean Alf, MD. Schedule an appointment as soon as possible for a visit in 2 weeks.   Specialty:  Orthopedic Surgery   Contact information:   [address] [place] [address] [phone]       Signed: Arlee Muslim, PA-C Orthopaedic Surgery 12/18/2013, 10:16 AM

## 2014-01-21 ENCOUNTER — Ambulatory Visit: Payer: 59 | Admitting: Neurology

## 2014-01-21 ENCOUNTER — Telehealth: Payer: Self-pay | Admitting: Neurology

## 2014-01-21 NOTE — Telephone Encounter (Signed)
No show. CD

## 2014-03-10 ENCOUNTER — Ambulatory Visit (HOSPITAL_COMMUNITY): Payer: Self-pay | Admitting: Psychiatry

## 2014-03-16 ENCOUNTER — Other Ambulatory Visit (HOSPITAL_COMMUNITY): Payer: Self-pay | Admitting: Psychiatry

## 2014-03-25 ENCOUNTER — Ambulatory Visit (HOSPITAL_COMMUNITY): Payer: Self-pay | Admitting: Psychiatry

## 2014-04-01 ENCOUNTER — Encounter (HOSPITAL_COMMUNITY): Payer: Self-pay | Admitting: Psychiatry

## 2014-04-01 ENCOUNTER — Ambulatory Visit (INDEPENDENT_AMBULATORY_CARE_PROVIDER_SITE_OTHER): Payer: 59 | Admitting: Psychiatry

## 2014-04-01 VITALS — BP 128/76 | HR 81 | Ht 61.0 in | Wt 206.0 lb

## 2014-04-01 DIAGNOSIS — F329 Major depressive disorder, single episode, unspecified: Secondary | ICD-10-CM

## 2014-04-01 DIAGNOSIS — F339 Major depressive disorder, recurrent, unspecified: Secondary | ICD-10-CM

## 2014-04-01 MED ORDER — VENLAFAXINE HCL ER 150 MG PO CP24: 150.0000 mg | ORAL_CAPSULE | Freq: Every day | ORAL | Status: DC

## 2014-04-01 MED ORDER — LAMOTRIGINE 100 MG PO TABS: 100.0000 mg | ORAL_TABLET | Freq: Every day | ORAL | Status: DC

## 2014-04-01 MED ORDER — BUPROPION HCL ER (XL) 150 MG PO TB24: 150.0000 mg | ORAL_TABLET | Freq: Every day | ORAL | Status: DC

## 2014-04-01 NOTE — Progress Notes (Signed)
[facility] [id number] Progress note   [name] [name] [id number] 58 y.o.  04/01/2014 4:48 PM  Chief Complaint:  Medication management and followup.      History of Present Illness:  [name] [name] came for her followup appointment.  She is taking her medication as prescribed.  She is without a walker .  She is improving off the surgery.  She has some stress from the work but overall she is feeling better.  She denies any irritability, anger, mood swings.  She is taking Effexor one 50 mg daily along with Wellbutrin and Lamictal.  She takes Ambien half tablet as needed.  She has not taken any Xanax in recent months.  She denies any panic attack.  She continues to see Pilar Jarvis for counseling .  She is not drinking or using any illegal substances.  Suicidal Ideation: No Plan Formed: No Patient has means to carry out plan: No  Homicidal Ideation: No Plan Formed: No Patient has means to carry out plan: No  Past Psychiatric History/Hospitalization(s) Anxiety: Yes Bipolar Disorder: No Depression: Yes Mania: No Psychosis: No Schizophrenia: No Personality Disorder: No Hospitalization for psychiatric illness: Yes History of Electroconvulsive Shock Therapy: No Prior Suicide Attempts: No  Medical History; Patient has hearing impairment, mild cognitive impairment, knee pain, obstructive sleep apnea, joint pain and obesity.  Her primary care physician is Dr. Jossie Ng at [facility].  Her neurologist is Dr Carilyn Goodpasture.   Review of Systems: Psychiatric: Agitation: No Hallucination: No Depressed Mood: No Insomnia: No Hypersomnia: No Altered Concentration: No Feels Worthless: No Grandiose Ideas: No Belief In Special Powers: No New/Increased Substance Abuse: No Compulsions: No  Neurologic: Headache: No Seizure: No Paresthesias: No Review of Systems  HENT: Positive for hearing loss.       Outpatient Encounter Prescriptions as of 04/01/2014  Medication Sig  . buPROPion  (WELLBUTRIN XL) 150 MG 24 hr tablet Take 1 tablet (150 mg total) by mouth daily.  Marland Kitchen lamoTRIgine (LAMICTAL) 100 MG tablet Take 1 tablet (100 mg total) by mouth daily.  . methocarbamol (ROBAXIN) 500 MG tablet Take 1 tablet (500 mg total) by mouth every 6 (six) hours as needed for muscle spasms.  . mupirocin nasal ointment (BACTROBAN) 2 % Place 1 application into the nose 2 (two) times daily. Use one-half of tube in each nostril twice daily for five (5) days. After application, press sides of nose together and gently massage.  Marland Kitchen omega-3 acid ethyl esters (LOVAZA) 1 G capsule Take 1 g by mouth daily.  Marland Kitchen oxyCODONE (OXY IR/ROXICODONE) 5 MG immediate release tablet Take 1-2 tablets (5-10 mg total) by mouth every 3 (three) hours as needed for breakthrough pain.  . rivaroxaban (XARELTO) 10 MG TABS tablet Take 1 tablet (10 mg total) by mouth daily with breakfast.  . travoprost, benzalkonium, (TRAVATAN) 0.004 % ophthalmic solution Place 1 drop into both eyes at bedtime.   Marland Kitchen venlafaxine XR (EFFEXOR-XR) 150 MG 24 hr capsule Take 1 capsule (150 mg total) by mouth daily with breakfast.  . zolpidem (AMBIEN) 10 MG tablet Take 10 mg by mouth at bedtime as needed for sleep.  . [DISCONTINUED] buPROPion (WELLBUTRIN XL) 150 MG 24 hr tablet TAKE 1 TABLET BY MOUTH EVERY MORNING  . [DISCONTINUED] lamoTRIgine (LAMICTAL) 100 MG tablet TAKE 1 TABLET BY MOUTH EVERY DAY  . [DISCONTINUED] venlafaxine XR (EFFEXOR-XR) 150 MG 24 hr capsule Take 1 capsule (150 mg total) by mouth 2 (two) times daily.    No results found for this or any  previous visit (from the past 2160 hour(s)).    Physical Exam: Constitutional:  BP 128/76  Pulse 81  Ht  (1.549 m)  Wt 206 lb (93.441 kg)  BMI 38.94 kg/m2  Musculoskeletal: Strength & Muscle Tone: within normal limits Gait & Station: normal Patient leans: N/A and The patient has a normal posture  Mental Status Examination;  Patient is well groomed well dressed female who appears to  be in her stated age.  She is pleasant and cooperative.  Her speech is clear and coherent.  She describes her mood is good and her affect is mood appropriate.  She denies any auditory or visual hallucination.  She denies any active or passive suicidal thoughts or homicidal thoughts. There were no flight of ideas or any looseness session.  Her attention and concentration is fair.  She has no tremors or shakes.  There were no paranoia, delusions or any obsessive thoughts at this time.  She's alert and oriented x3.  Her insight judgment and pulse control is okay.     Established Problem, Stable/Improving (1), Review of Psycho-Social Stressors (1), Review of Last Therapy Session (1) and Review of Medication Regimen & Side Effects (2)  Assessment: Axis I: Maj. depressive disorder, recurrent  Axis II: Deferred  Axis III:  Past Medical History  Diagnosis Date  . Memory changes   . Deafness     Wears Bilat hearing aids since Childhood  . Obese   . Mild cognitive impairment, so stated 07/16/2013    Patient subjectively complains of word finding and memory deficits. MOCA 30-30 on 07-16-13 , paraphrasic errors- uses words such as  closet for garage   . Anxiety   . Depression   . Hyperlipidemia   . Arthritis   . OSA (obstructive sleep apnea)     uses BIPAP  . Glaucoma     Axis IV: Mild to moderate   Plan:  Patient is doing better on her current medication.  She is taking Effexor one 50 mg daily, Lamictal 100 mg daily and Wellbutrin 100 mg daily.  Recommended to call us back if she has any question or any concern.  She does not require a new prescription of Ambien.  I will see her again in 3 months.    Narada Uzzle T., MD 04/01/2014

## 2014-05-22 ENCOUNTER — Other Ambulatory Visit: Payer: Self-pay

## 2014-06-09 ENCOUNTER — Telehealth (HOSPITAL_COMMUNITY): Payer: Self-pay

## 2014-06-10 NOTE — Telephone Encounter (Signed)
I returned phone call and left a message to call us back. 

## 2014-06-22 ENCOUNTER — Ambulatory Visit (INDEPENDENT_AMBULATORY_CARE_PROVIDER_SITE_OTHER): Payer: 59 | Admitting: Psychiatry

## 2014-06-22 ENCOUNTER — Encounter (HOSPITAL_COMMUNITY): Payer: Self-pay | Admitting: Psychiatry

## 2014-06-22 VITALS — BP 136/84 | HR 76 | Ht 60.0 in | Wt 207.6 lb

## 2014-06-22 DIAGNOSIS — F332 Major depressive disorder, recurrent severe without psychotic features: Secondary | ICD-10-CM

## 2014-06-22 DIAGNOSIS — F330 Major depressive disorder, recurrent, mild: Secondary | ICD-10-CM

## 2014-06-22 MED ORDER — LAMOTRIGINE 100 MG PO TABS: 100.0000 mg | ORAL_TABLET | Freq: Every day | ORAL | Status: DC

## 2014-06-22 MED ORDER — BUPROPION HCL ER (XL) 150 MG PO TB24: 150.0000 mg | ORAL_TABLET | Freq: Every day | ORAL | Status: DC

## 2014-06-22 MED ORDER — VENLAFAXINE HCL ER 150 MG PO CP24: 150.0000 mg | ORAL_CAPSULE | Freq: Every day | ORAL | Status: DC

## 2014-06-22 NOTE — Progress Notes (Signed)
[facility] [id number] Progress note   [name] [id number] 58 y.o.  06/22/2014 4:42 PM  Chief Complaint:  Medication management and followup.      History of Present Illness:  [name] [name] came for her followup appointment.  She is compliant with her medication.  She is less anxious and less depressed.  She denies any crying spells.  She is hoping to visit child's time with her partner.  Patient denies any irritability, anger, mood swings.  Her sleep is good.  She is frustrated because she did not lost weight but she is more careful about her diet.  She takes Ambien only as needed.  She wants to continue Lamictal, Wellbutrin and Effexor.  She is not drinking or using any illegal substances. She continues to see Pilar JarvisLisa Florence for counseling .    Suicidal Ideation: No Plan Formed: No Patient has means to carry out plan: No  Homicidal Ideation: No Plan Formed: No Patient has means to carry out plan: No  Past Psychiatric History/Hospitalization(s) Anxiety: Yes Bipolar Disorder: No Depression: Yes Mania: No Psychosis: No Schizophrenia: No Personality Disorder: No Hospitalization for psychiatric illness: Yes History of Electroconvulsive Shock Therapy: No Prior Suicide Attempts: No  Medical History; Patient has hearing impairment, mild cognitive impairment, knee pain, obstructive sleep apnea, joint pain and obesity.  [facility]  Her neurologist is Dr Carilyn Goodpastureolmier.   Review of Systems: Psychiatric: Agitation: No Hallucination: No Depressed Mood: No Insomnia: No Hypersomnia: No Altered Concentration: No Feels Worthless: No Grandiose Ideas: No Belief In Special Powers: No New/Increased Substance Abuse: No Compulsions: No  Neurologic: Headache: No Seizure: No Paresthesias: No Review of Systems  HENT: Positive for hearing loss.       Outpatient Encounter Prescriptions as of 06/22/2014  Medication Sig  . buPROPion  (WELLBUTRIN XL) 150 MG 24 hr tablet Take 1 tablet (150 mg total) by mouth daily.  Marland Kitchen. lamoTRIgine (LAMICTAL) 100 MG tablet Take 1 tablet (100 mg total) by mouth daily.  . methocarbamol (ROBAXIN) 500 MG tablet Take 1 tablet (500 mg total) by mouth every 6 (six) hours as needed for muscle spasms.  . mupirocin nasal ointment (BACTROBAN) 2 % Place 1 application into the nose 2 (two) times daily. Use one-half of tube in each nostril twice daily for five (5) days. After application, press sides of nose together and gently massage.  Marland Kitchen. omega-3 acid ethyl esters (LOVAZA) 1 G capsule Take 1 g by mouth daily.  Marland Kitchen. oxyCODONE (OXY IR/ROXICODONE) 5 MG immediate release tablet Take 1-2 tablets (5-10 mg total) by mouth every 3 (three) hours as needed for breakthrough pain.  . rivaroxaban (XARELTO) 10 MG TABS tablet Take 1 tablet (10 mg total) by mouth daily with breakfast.  . travoprost, benzalkonium, (TRAVATAN) 0.004 % ophthalmic solution Place 1 drop into both eyes at bedtime.   Marland Kitchen. venlafaxine XR (EFFEXOR-XR) 150 MG 24 hr capsule Take 1 capsule (150 mg total) by mouth daily with breakfast.  . zolpidem (AMBIEN) 10 MG tablet Take 10 mg by mouth at bedtime as needed for sleep.  . [DISCONTINUED] buPROPion (WELLBUTRIN XL) 150 MG 24 hr tablet Take 1 tablet (150 mg total) by mouth daily.  . [DISCONTINUED] lamoTRIgine (LAMICTAL) 100 MG tablet Take 1 tablet (100 mg total) by mouth daily.  . [DISCONTINUED] venlafaxine XR (EFFEXOR-XR) 150 MG 24 hr capsule Take 1 capsule (150 mg total) by mouth daily with breakfast.    No results found for this or  any previous visit (from the past 2160 hour(s)).    Physical Exam: Constitutional:  BP 136/84 mmHg  Pulse 76  Ht 5' (1.524 m)  Wt 207 lb 9.6 oz (94.167 kg)  BMI 40.54 kg/m2  Musculoskeletal: Strength & Muscle Tone: within normal limits Gait & Station: normal Patient leans: N/A and The patient has a normal posture  Mental Status Examination;  Patient is well groomed well  dressed female who appears to be in her stated age.  She is pleasant and cooperative.  Her speech is clear and coherent.  She describes her mood is good and her affect is mood appropriate.  She denies any auditory or visual hallucination.  She denies any active or passive suicidal thoughts or homicidal thoughts. There were no flight of ideas or any looseness session.  Her attention and concentration is fair.  She has no tremors or shakes.  There were no paranoia, delusions or any obsessive thoughts at this time.  She's alert and oriented x3.  Her insight judgment and pulse control is okay.     Established Problem, Stable/Improving (1), Review of Psycho-Social Stressors (1), Review of Last Therapy Session (1) and Review of Medication Regimen & Side Effects (2)  Assessment: Axis I: Maj. depressive disorder, recurrent  Axis II: Deferred  Axis III:  Past Medical History  Diagnosis Date  . Memory changes   . Deafness     Wears Bilat hearing aids since Childhood  . Obese   . Mild cognitive impairment, so stated 07/16/2013    Patient subjectively complains of word finding and memory deficits. MOCA 30-30 on 07-16-13 , paraphrasic errors- uses words such as  closet for garage   . Anxiety   . Depression   . Hyperlipidemia   . Arthritis   . OSA (obstructive sleep apnea)     uses BIPAP  . Glaucoma     Axis IV: Mild to moderate   Plan:  Patient is stable on her current medication.  Encouraged to watch her calories and deliver exercise.  She has no tremors or shakes.  I will continue Effexor 150 mg daily, Lamictal 100 mg daily and Wellbutrin 150 mg daily.  Recommended to call us back if she has any question or any concern.  She does not require a new prescription of Ambien.  I will see her again in 3 months.    Keiosha Cancro T., MD 06/22/2014

## 2014-06-25 ENCOUNTER — Telehealth (HOSPITAL_COMMUNITY): Payer: Self-pay | Admitting: *Deleted

## 2014-06-25 NOTE — Telephone Encounter (Signed)
Patient left VM: Forgot to fill pill minder and did not take Lamictal, Wellbutrin or Effexor for 2 days. Has now restarted medication but experienced several symptoms she wants to discuss.  Phoned patient.She has been back on medication for 2 days.While off medication,her mood was down, she experienced crying spells and brain fog. Advised pt that all those are common symptoms with suddenly stopping these medications, and that symptoms should subside as time passes. Encouraged her to take things slowly as her body adjusts to medication again. Instructed her to contact office on Monday 06/29/14 if symptoms had not reduced. Pt thanked Clinical research associatewriter for information and reassurance and verbalized understanding.

## 2014-09-22 ENCOUNTER — Other Ambulatory Visit (HOSPITAL_COMMUNITY): Payer: Self-pay | Admitting: Psychiatry

## 2014-09-22 NOTE — Telephone Encounter (Signed)
Refill denied as patient has an appointment on 09/23/14.

## 2014-09-23 ENCOUNTER — Ambulatory Visit (INDEPENDENT_AMBULATORY_CARE_PROVIDER_SITE_OTHER): Payer: 59 | Admitting: Psychiatry

## 2014-09-23 ENCOUNTER — Encounter (HOSPITAL_COMMUNITY): Payer: Self-pay | Admitting: Psychiatry

## 2014-09-23 VITALS — BP 147/84 | HR 95 | Ht 60.0 in | Wt 220.0 lb

## 2014-09-23 DIAGNOSIS — F330 Major depressive disorder, recurrent, mild: Secondary | ICD-10-CM

## 2014-09-23 MED ORDER — BUPROPION HCL ER (XL) 150 MG PO TB24: 150.0000 mg | ORAL_TABLET | Freq: Every day | ORAL | Status: DC

## 2014-09-23 MED ORDER — VENLAFAXINE HCL ER 150 MG PO CP24: 150.0000 mg | ORAL_CAPSULE | Freq: Every day | ORAL | Status: DC

## 2014-09-23 MED ORDER — LAMOTRIGINE 100 MG PO TABS: 100.0000 mg | ORAL_TABLET | Freq: Every day | ORAL | Status: DC

## 2014-09-23 NOTE — Progress Notes (Signed)
[facility] [id number] Progress note   [name] [id number] 59 y.o.  09/23/2014 4:42 PM  Chief Complaint:  Medication management and followup.      History of Present Illness:  [name] came for her followup appointment.  She is a good Christmas.  [place] .  She had a good time.  Recently she has some issues with her partner and she is seeing a marriage Veterinary surgeoncounselor.  She is no longer seeing Pilar JarvisLisa Florence .  She started to seeing Dennison NancyNancy Bolles and she is very happy with her new therapist.  She is compliant with medication and denies any major issues of concern.  She admitted some time crying spells and insomnia but overall she feels the medicine is working okay.  She still takes Ambien half tablet as needed which is prescribed by her primary care physician.  She denies any major panic attack.  Her energy level is okay.  She has leg pain on and off.  She scheduled to see Dr. Antony OdeaLucio in few weeks .  Patient denies any rash or itching.  Patient denies drinking or using any illegal substances.  Her appetite is okay.  Her vitals are stable.  Suicidal Ideation: No Plan Formed: No Patient has means to carry out plan: No  Homicidal Ideation: No Plan Formed: No Patient has means to carry out plan: No  Past Psychiatric History/Hospitalization(s) Anxiety: Yes Bipolar Disorder: No Depression: Yes Mania: No Psychosis: No Schizophrenia: No Personality Disorder: No Hospitalization for psychiatric illness: Yes History of Electroconvulsive Shock Therapy: No Prior Suicide Attempts: No  Medical History; Patient has hearing impairment, mild cognitive impairment, knee pain, obstructive sleep apnea, joint pain and obesity.  Her [facility].  Her neurologist is Dr Carilyn Goodpastureolmier.   Review of Systems: Psychiatric: Agitation: No Hallucination: No Depressed Mood: No Insomnia: No Hypersomnia: No Altered Concentration: No Feels Worthless:  No Grandiose Ideas: No Belief In Special Powers: No New/Increased Substance Abuse: No Compulsions: No  Neurologic: Headache: No Seizure: No Paresthesias: No ROS    Outpatient Encounter Prescriptions as of 09/23/2014  Medication Sig  . buPROPion (WELLBUTRIN XL) 150 MG 24 hr tablet Take 1 tablet (150 mg total) by mouth daily.  Marland Kitchen. lamoTRIgine (LAMICTAL) 100 MG tablet Take 1 tablet (100 mg total) by mouth daily.  . methocarbamol (ROBAXIN) 500 MG tablet Take 1 tablet (500 mg total) by mouth every 6 (six) hours as needed for muscle spasms.  . mupirocin nasal ointment (BACTROBAN) 2 % Place 1 application into the nose 2 (two) times daily. Use one-half of tube in each nostril twice daily for five (5) days. After application, press sides of nose together and gently massage.  Marland Kitchen. omega-3 acid ethyl esters (LOVAZA) 1 G capsule Take 1 g by mouth daily.  Marland Kitchen. oxyCODONE (OXY IR/ROXICODONE) 5 MG immediate release tablet Take 1-2 tablets (5-10 mg total) by mouth every 3 (three) hours as needed for breakthrough pain.  . rivaroxaban (XARELTO) 10 MG TABS tablet Take 1 tablet (10 mg total) by mouth daily with breakfast.  . travoprost, benzalkonium, (TRAVATAN) 0.004 % ophthalmic solution Place 1 drop into both eyes at bedtime.   Marland Kitchen. venlafaxine XR (EFFEXOR-XR) 150 MG 24 hr capsule Take 1 capsule (150 mg total) by mouth daily with breakfast.  . zolpidem (AMBIEN) 10 MG tablet Take 10 mg by mouth at bedtime as needed for sleep.  . [DISCONTINUED] buPROPion (WELLBUTRIN XL) 150 MG 24 hr tablet Take 1 tablet (150  mg total) by mouth daily.  . [DISCONTINUED] lamoTRIgine (LAMICTAL) 100 MG tablet Take 1 tablet (100 mg total) by mouth daily.  . [DISCONTINUED] venlafaxine XR (EFFEXOR-XR) 150 MG 24 hr capsule Take 1 capsule (150 mg total) by mouth daily with breakfast.    No results found for this or any previous visit (from the past 2160 hour(s)).    Physical Exam: Constitutional:  BP 147/84 mmHg  Pulse 95  Ht 5' (1.524  m)  Wt 220 lb (99.791 kg)  BMI 42.97 kg/m2  Musculoskeletal: Strength & Muscle Tone: within normal limits Gait & Station: normal Patient leans: N/A and The patient has a normal posture  Mental Status Examination;  Patient is well groomed well dressed female who appears to be in her stated age.  She is pleasant and cooperative.  Her speech is clear and coherent.  She describes her mood is good and her affect is mood appropriate.  She denies any auditory or visual hallucination.  She denies any active or passive suicidal thoughts or homicidal thoughts. There were no flight of ideas or any looseness session.  Her attention and concentration is fair.  She has no tremors or shakes.  There were no paranoia, delusions or any obsessive thoughts at this time.  She's alert and oriented x3.  Her insight judgment and pulse control is okay.     Established Problem, Stable/Improving (1), Review of Psycho-Social Stressors (1), Review of Last Therapy Session (1) and Review of Medication Regimen & Side Effects (2)  Assessment: Axis I: Maj. depressive disorder, recurrent  Axis II: Deferred  Axis III:  Past Medical History  Diagnosis Date  . Memory changes   . Deafness     Wears Bilat hearing aids since Childhood  . Obese   . Mild cognitive impairment, so stated 07/16/2013    Patient subjectively complains of word finding and memory deficits. MOCA 30-30 on 07-16-13 , paraphrasic errors- uses words such as  closet for garage   . Anxiety   . Depression   . Hyperlipidemia   . Arthritis   . OSA (obstructive sleep apnea)     uses BIPAP  . Glaucoma    Plan:  Patient is stable on her current medication.  She has no rash or itching.  She is seeing a marriage counselor and hoping that her issues with a partner get resolved.  I will continue Effexor 150 mg daily, Lamictal 100 mg daily and Wellbutrin 150 mg daily.  She is getting prescription of Ambien from her primary care physician.  Recommended to call us  back if she has any question or any concern.  I will see her again in 3 months.    Faolan Springfield T., MD 09/23/2014

## 2014-09-25 ENCOUNTER — Telehealth (HOSPITAL_COMMUNITY): Payer: Self-pay

## 2014-09-25 NOTE — Telephone Encounter (Signed)
[facility] needed a prior authorization for her Venlafaxine.  [facility] reported the medication did not need a prior authorization so called [facility] back to inform.  Pharmacist reported fax sent in error as patient was just trying to fill a few days early but agreed no prior authorization needed.

## 2014-12-23 ENCOUNTER — Ambulatory Visit (INDEPENDENT_AMBULATORY_CARE_PROVIDER_SITE_OTHER): Payer: 59 | Admitting: Psychiatry

## 2014-12-23 ENCOUNTER — Encounter (HOSPITAL_COMMUNITY): Payer: Self-pay | Admitting: Psychiatry

## 2014-12-23 VITALS — BP 132/72 | HR 89 | Ht 60.0 in

## 2014-12-23 DIAGNOSIS — F330 Major depressive disorder, recurrent, mild: Secondary | ICD-10-CM

## 2014-12-23 DIAGNOSIS — F419 Anxiety disorder, unspecified: Secondary | ICD-10-CM | POA: Diagnosis not present

## 2014-12-23 DIAGNOSIS — F339 Major depressive disorder, recurrent, unspecified: Secondary | ICD-10-CM | POA: Diagnosis not present

## 2014-12-23 MED ORDER — BUPROPION HCL ER (XL) 150 MG PO TB24: 150.0000 mg | ORAL_TABLET | Freq: Every day | ORAL | Status: DC

## 2014-12-23 MED ORDER — VENLAFAXINE HCL ER 150 MG PO CP24: 150.0000 mg | ORAL_CAPSULE | Freq: Every day | ORAL | Status: DC

## 2014-12-23 MED ORDER — LAMOTRIGINE 150 MG PO TABS: 150.0000 mg | ORAL_TABLET | Freq: Every day | ORAL | Status: DC

## 2014-12-23 NOTE — Progress Notes (Signed)
[facility] [id number] Progress note   [name] [name] [id number] 59 y.o.  12/23/2014 4:10 PM  Chief Complaint:   I am feeling sad and depressed. I'm very worried about my job.      History of Present Illness:  [name] [name] came for her followup appointment.   She is complaining of increased anxiety and depression.  Recently she is stressed about her job and there are times that she is unable to complete her task. She admitted racing thoughts, nervousness and poor sleep. She endorse marital issues with her partner and now she is seeing a marriage counselor on a regular basis.  She admitted some time irritability and crying spells but denies any suicidal thoughts or homicidal thought.  She is seeing [name] [name] for counseling. Her partner [name] [name] also seeing a therapist. Patient told together they want this managed to work but there are times that she gets hopeless and helpless. She is taking her medication as prescribed. She is taking Ambien 5 mg half tablet as needed for insomnia. Her energy level is okay. She has back pain and she is taking pain medication for that.  She denies any aggression or any paranoia.  She wants to know if medicine can be further increased.  Patient denies drinking or using any illegal substances.  Her appetite is okay.  Her vitals are stable.  Patient was seen by her primary care physician [name] [name] for recent upper respiratory infection.  She finished antibiotic.  There were no new blood work done.  Suicidal Ideation: No Plan Formed: No Patient has means to carry out plan: No  Homicidal Ideation: No Plan Formed: No Patient has means to carry out plan: No  Past Psychiatric History/Hospitalization(s) Anxiety: Yes Bipolar Disorder: No Depression: Yes Mania: No Psychosis: No Schizophrenia: No Personality Disorder: No Hospitalization for psychiatric illness: Yes History of Electroconvulsive Shock Therapy: No Prior Suicide Attempts: No  Medical  History; Patient has hearing impairment, mild cognitive impairment, knee pain, obstructive sleep apnea, joint pain and obesity.  Her primary care physician is  [name] [name] , family medicine and her neurologist is Dr [name] [name].   Review of Systems: Psychiatric: Agitation: No Hallucination: No Depressed Mood: Yes Insomnia: Yes Hypersomnia: No Altered Concentration: No Feels Worthless: No Grandiose Ideas: No Belief In Special Powers: No New/Increased Substance Abuse: No Compulsions: No  Neurologic: Headache: No Seizure: No Paresthesias: No Review of Systems  Constitutional: Negative.   HENT: Positive for hearing loss.   Eyes: Negative for blurred vision.  Gastrointestinal: Negative for nausea and vomiting.  Musculoskeletal: Positive for back pain and joint pain.  Skin: Negative for itching and rash.  Neurological: Negative for dizziness, tremors and headaches.        Mild cognitive impairment  Psychiatric/Behavioral: Positive for depression. Negative for suicidal ideas. The patient is nervous/anxious and has insomnia.       Outpatient Encounter Prescriptions as of 12/23/2014  Medication Sig  . buPROPion (WELLBUTRIN XL) 150 MG 24 hr tablet Take 1 tablet (150 mg total) by mouth daily.  Marland Kitchen. HYDROcodone-acetaminophen (NORCO/VICODIN) 5-325 MG per tablet   . lamoTRIgine (LAMICTAL) 150 MG tablet Take 1 tablet (150 mg total) by mouth daily.  . Multiple Vitamins-Minerals (ALIVE WOMENS 50+ PO) Take by mouth.  . omega-3 acid ethyl esters (LOVAZA) 1 G capsule Take 1 g by mouth daily.  . travoprost, benzalkonium, (TRAVATAN) 0.004 % ophthalmic solution Place 1 drop into both eyes at bedtime.   Marland Kitchen. venlafaxine XR (EFFEXOR-XR) 150 MG 24 hr capsule  Take 1 capsule (150 mg total) by mouth daily with breakfast.  . zolpidem (AMBIEN) 10 MG tablet Take 10 mg by mouth at bedtime as needed for sleep.  Marland Kitchen. amoxicillin (AMOXIL) 500 MG capsule   . celecoxib (CELEBREX) 200 MG capsule   . mupirocin nasal  ointment (BACTROBAN) 2 % Place 1 application into the nose 2 (two) times daily. Use one-half of tube in each nostril twice daily for five (5) days. After application, press sides of nose together and gently massage.  . [DISCONTINUED] buPROPion (WELLBUTRIN XL) 150 MG 24 hr tablet Take 1 tablet (150 mg total) by mouth daily.  . [DISCONTINUED] lamoTRIgine (LAMICTAL) 100 MG tablet Take 1 tablet (100 mg total) by mouth daily.  . [DISCONTINUED] methocarbamol (ROBAXIN) 500 MG tablet Take 1 tablet (500 mg total) by mouth every 6 (six) hours as needed for muscle spasms.  . [DISCONTINUED] oxyCODONE (OXY IR/ROXICODONE) 5 MG immediate release tablet Take 1-2 tablets (5-10 mg total) by mouth every 3 (three) hours as needed for breakthrough pain.  . [DISCONTINUED] rivaroxaban (XARELTO) 10 MG TABS tablet Take 1 tablet (10 mg total) by mouth daily with breakfast.  . [DISCONTINUED] venlafaxine XR (EFFEXOR-XR) 150 MG 24 hr capsule Take 1 capsule (150 mg total) by mouth daily with breakfast.   No facility-administered encounter medications on file as of 12/23/2014.    No results found for this or any previous visit (from the past 2160 hour(s)).    Physical Exam: Constitutional:  BP 132/72 mmHg  Pulse 89  Ht 5' (1.524 m)  Wt   SpO2 96%  Musculoskeletal: Strength & Muscle Tone: within normal limits Gait & Station: normal Patient leans: N/A and The patient has a normal posture  Mental Status Examination;   patient is casually dressed and groomed.  She appears anxious nervous and depressed.  She described her mood sad and her affect is mood appropriate. her speech is coherent. She denies any auditory or visual hallucination.  She denies any active or passive suicidal thoughts or homicidal thoughts. There were no flight of ideas or any looseness session.  Her attention and concentration is fair.  She has no tremors or shakes.  There were no paranoia, delusions or any obsessive thoughts at this time.  She's alert  and oriented x3.  Her insight judgment and pulse control is okay.     Established Problem, Stable/Improving (1), Review of Psycho-Social Stressors (1), Review and summation of old records (2), Established Problem, Worsening (2), Review of Last Therapy Session (1), Review of Medication Regimen & Side Effects (2) and Review of New Medication or Change in Dosage (2)  Assessment: Axis I: Maj. depressive disorder, recurrent,  Anxiety disorder NOS  Axis II: Deferred  Axis III:  Past Medical History  Diagnosis Date  . Memory changes   . Deafness     Wears Bilat hearing aids since Childhood  . Obese   . Mild cognitive impairment, so stated 07/16/2013    Patient subjectively complains of word finding and memory deficits. MOCA 30-30 on 07-16-13 , paraphrasic errors- uses words such as  closet for garage   . Anxiety   . Depression   . Hyperlipidemia   . Arthritis   . OSA (obstructive sleep apnea)     uses BIPAP  . Glaucoma   . Status post bilateral knee replacements 11/2013   Plan:   I reviewed records from her primary care physician.  She is no longer taking antibiotic.  I do believe her symptoms of  depression and anxiety is getting worse.  She is taking Wellbutrin 150 mg daily, Effexor 150 mg daily and Lamictal 100 mg daily.  She has no rash itching or any side effects.  She has no tremors or shakes.  I recommended to increase the Lamictal 150 mg daily to help her depressive symptoms.  Encouraged to keep appointment with therapist for coping and social skills.  Discussed medication side effects and benefits.  Discuss safety concern and recommended to call 911 or go to the local emergency room if she started to have suicidal thoughts or homicidal thought.  She was given enough time to ask and responding to any question about her medication, diagnosis and prognosis.  I will see her again in 3 months.  I also discussed sleep hygiene and weight maintenance. Time spent 25 minutes.   More than 50% of  the time spent in psychoeducation, counseling and coordination of care.  She is getting prescription of Ambien from her primary care physician.   Alegandra Sommers T., MD 12/23/2014

## 2015-01-15 ENCOUNTER — Encounter: Payer: Self-pay | Admitting: Gastroenterology

## 2015-01-27 ENCOUNTER — Telehealth (HOSPITAL_COMMUNITY): Payer: Self-pay

## 2015-01-27 NOTE — Telephone Encounter (Signed)
Telephone message left for patient after receiving a message from her requesting a refill of her Ambien.  Left patient a message to question if this order is needed from Dr. Lolly Mustache as his note from 12/23/14 reports patient is obtaining Ambien from her primary care provider.  Requested patient to call back if this is not the case?

## 2015-03-25 ENCOUNTER — Ambulatory Visit (HOSPITAL_COMMUNITY): Payer: Self-pay | Admitting: Psychiatry

## 2015-03-27 ENCOUNTER — Other Ambulatory Visit (HOSPITAL_COMMUNITY): Payer: Self-pay | Admitting: Psychiatry

## 2015-03-29 ENCOUNTER — Other Ambulatory Visit (HOSPITAL_COMMUNITY): Payer: Self-pay | Admitting: Psychiatry

## 2015-03-29 DIAGNOSIS — F330 Major depressive disorder, recurrent, mild: Secondary | ICD-10-CM

## 2015-03-29 NOTE — Telephone Encounter (Signed)
Medication refill request - left patient a message she missed her appointment on 03/25/15 and needed to call back to reschedule first available with Dr. Lolly Mustache and to inform which refills she was in need of until she can be rescheduled.

## 2015-03-30 MED ORDER — BUPROPION HCL ER (XL) 150 MG PO TB24: 150.0000 mg | ORAL_TABLET | Freq: Every day | ORAL | Status: DC

## 2015-03-30 MED ORDER — VENLAFAXINE HCL ER 150 MG PO CP24: 150.0000 mg | ORAL_CAPSULE | Freq: Every day | ORAL | Status: DC

## 2015-03-30 NOTE — Telephone Encounter (Signed)
Met with Dr. Adele Schilder who authorized refills of patient's prescribed Wellbutrin XL, Effexor XR, and Lamictal after patient rescheduled missed appointment from 03/25/15 to 04/06/15.  One time refill orders for patient's Wellbutrin XL, Effexor XR and Lamictal e-scribed into patient's [facility] in [place] as authorized by Dr. Adele Schilder with instruction, evaluation required for further refills. Patient to return for evaluation now set for 04/06/15.

## 2015-04-06 ENCOUNTER — Encounter (HOSPITAL_COMMUNITY): Payer: Self-pay | Admitting: Psychiatry

## 2015-04-06 ENCOUNTER — Ambulatory Visit (INDEPENDENT_AMBULATORY_CARE_PROVIDER_SITE_OTHER): Payer: 59 | Admitting: Psychiatry

## 2015-04-06 VITALS — BP 136/80 | HR 73 | Ht 60.0 in | Wt 207.0 lb

## 2015-04-06 DIAGNOSIS — F330 Major depressive disorder, recurrent, mild: Secondary | ICD-10-CM

## 2015-04-06 DIAGNOSIS — F419 Anxiety disorder, unspecified: Secondary | ICD-10-CM

## 2015-04-06 DIAGNOSIS — F339 Major depressive disorder, recurrent, unspecified: Secondary | ICD-10-CM | POA: Diagnosis not present

## 2015-04-06 MED ORDER — BUPROPION HCL ER (XL) 150 MG PO TB24: 150.0000 mg | ORAL_TABLET | Freq: Every day | ORAL | Status: DC

## 2015-04-06 MED ORDER — VENLAFAXINE HCL ER 150 MG PO CP24: 150.0000 mg | ORAL_CAPSULE | Freq: Every day | ORAL | Status: DC

## 2015-04-06 MED ORDER — LAMOTRIGINE 150 MG PO TABS: 150.0000 mg | ORAL_TABLET | Freq: Every day | ORAL | Status: DC

## 2015-04-06 NOTE — Progress Notes (Signed)
[facility] [id number] Progress note   [name] [name] [id number] 59 y.o.  04/06/2015 5:01 PM  Chief Complaint:  Medication management and follow-up.       History of Present Illness:  [name] [name] came for her followup appointment.   On her last visit we increase Lamictal and she has seen much improvement in her mood and depression.  She is less irritable and less anxious.  She is tolerating her medication without any side effects.  She denies any feeling of hopelessness or worthlessness.  She continues to have issues with her partner and she is seeing Silvana Newness for counseling.  Since Lamictal increase she denies any crying spells or any feeling of hopelessness.  She still have back pain and she takes medication for that.  She is using Ambien 5 mg as needed.  He is concerned about her memory but she has not discussed with her primary care physician.  Patient denies drinking or using any illegal substances.  Her energy level is good.  Her vitals are stable.  Her appetite is okay.  Suicidal Ideation: No Plan Formed: No Patient has means to carry out plan: No  Homicidal Ideation: No Plan Formed: No Patient has means to carry out plan: No  Past Psychiatric History/Hospitalization(s) Anxiety: Yes Bipolar Disorder: No Depression: Yes Mania: No Psychosis: No Schizophrenia: No Personality Disorder: No Hospitalization for psychiatric illness: Yes History of Electroconvulsive Shock Therapy: No Prior Suicide Attempts: No  Medical History; Patient has hearing impairment, mild cognitive impairment, knee pain, obstructive sleep apnea, joint pain and obesity.  Her primary care physician is  Theodis Sato , family medicine and her neurologist is Dr Brewing technologist.   Review of Systems: Psychiatric: Agitation: No Hallucination: No Depressed Mood: Yes Insomnia: Yes Hypersomnia: No Altered Concentration: No Feels Worthless: No Grandiose Ideas: No Belief In Special Powers: No New/Increased  Substance Abuse: No Compulsions: No  Neurologic: Headache: No Seizure: No Paresthesias: No Review of Systems  Constitutional: Negative.   HENT: Positive for hearing loss.   Eyes: Negative for blurred vision.  Gastrointestinal: Negative for nausea and vomiting.  Musculoskeletal: Positive for back pain and joint pain.  Skin: Negative for itching and rash.  Neurological: Negative for dizziness, tremors and headaches.        Mild cognitive impairment  Psychiatric/Behavioral: Negative for suicidal ideas. The patient has insomnia.       Outpatient Encounter Prescriptions as of 04/06/2015  Medication Sig  . amoxicillin (AMOXIL) 500 MG capsule   . buPROPion (WELLBUTRIN XL) 150 MG 24 hr tablet Take 1 tablet (150 mg total) by mouth daily.  . celecoxib (CELEBREX) 200 MG capsule   . HYDROcodone-acetaminophen (NORCO/VICODIN) 5-325 MG per tablet   . lamoTRIgine (LAMICTAL) 150 MG tablet Take 1 tablet (150 mg total) by mouth daily.  . Multiple Vitamins-Minerals (ALIVE WOMENS 50+ PO) Take by mouth.  . mupirocin nasal ointment (BACTROBAN) 2 % Place 1 application into the nose 2 (two) times daily. Use one-half of tube in each nostril twice daily for five (5) days. After application, press sides of nose together and gently massage.  Marland Kitchen omega-3 acid ethyl esters (LOVAZA) 1 G capsule Take 1 g by mouth daily.  . travoprost, benzalkonium, (TRAVATAN) 0.004 % ophthalmic solution Place 1 drop into both eyes at bedtime.   Marland Kitchen venlafaxine XR (EFFEXOR-XR) 150 MG 24 hr capsule Take 1 capsule (150 mg total) by mouth daily with breakfast.  . zolpidem (AMBIEN) 10 MG tablet Take 10 mg by mouth at bedtime as  needed for sleep.  . [DISCONTINUED] buPROPion (WELLBUTRIN XL) 150 MG 24 hr tablet Take 1 tablet (150 mg total) by mouth daily.  . [DISCONTINUED] lamoTRIgine (LAMICTAL) 150 MG tablet TAKE 1 TABLET BY MOUTH DAILY  . [DISCONTINUED] venlafaxine XR (EFFEXOR-XR) 150 MG 24 hr capsule Take 1 capsule (150 mg total) by mouth  daily with breakfast.   No facility-administered encounter medications on file as of 04/06/2015.    No results found for this or any previous visit (from the past 2160 hour(s)).    Physical Exam: Constitutional:  BP 136/80 mmHg  Pulse 73  Ht 5' (1.524 m)  Wt 207 lb (93.895 kg)  BMI 40.43 kg/m2  Musculoskeletal: Strength & Muscle Tone: within normal limits Gait & Station: normal Patient leans: N/A and The patient has a normal posture  Mental Status Examination;   patient is casually dressed and groomed.  She is pleasant and cooperative.  She described her mood euthymic and her affect is appropriate.  Her speech is coherent .  She read lips due to hearing impairment.  She denies any auditory or visual hallucination.  She denies any active or passive suicidal thoughts or homicidal thoughts. There were no flight of ideas or any looseness session.  Her attention and concentration is fair.  She has no tremors or shakes.  There were no paranoia, delusions or any obsessive thoughts at this time.  She's alert and oriented x3.  Her insight judgment and pulse control is okay.     Established Problem, Stable/Improving (1), Review of Psycho-Social Stressors (1), Review of Last Therapy Session (1) and Review of Medication Regimen & Side Effects (2)  Assessment: Axis I: Maj. depressive disorder, recurrent,  Anxiety disorder NOS  Axis II: Deferred  Axis III:  Past Medical History  Diagnosis Date  . Memory changes   . Deafness     Wears Bilat hearing aids since Childhood  . Obese   . Mild cognitive impairment, so stated 07/16/2013    Patient subjectively complains of word finding and memory deficits. MOCA 30-30 on 07-16-13 , paraphrasic errors- uses words such as  closet for garage   . Anxiety   . Depression   . Hyperlipidemia   . Arthritis   . OSA (obstructive sleep apnea)     uses BIPAP  . Glaucoma   . 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  Discuss medication causing memory impairment and one of them is Ambien.  Recommended to use Ambien only as needed and if symptoms continue to get worse then she should talk to her primary care physician to get neurology consult for memory impairment.  She is getting Ambien from her primary care physician.  At this time I will not change any of her psychiatric medications since she is doing much better.  I will continue Wellbutrin 150 mg daily, Effexor 150 mg daily and Lamictal 150 mg daily.  She has no rash itching or any side effects.  She has no tremors or shakes. Discussed medication side effects and benefits.  Discuss safety concern and recommended to call 911 or go to the local emergency room if she started to have suicidal thoughts or homicidal thought.  Encouraged to keep appointment with her therapist for coping and social skills.  Follow-up in 3 months.    ARFEEN,SYED T., MD 04/06/2015

## 2015-04-29 ENCOUNTER — Other Ambulatory Visit: Payer: Self-pay | Admitting: Family

## 2015-04-29 DIAGNOSIS — Z1231 Encounter for screening mammogram for malignant neoplasm of breast: Secondary | ICD-10-CM

## 2015-05-03 ENCOUNTER — Other Ambulatory Visit (HOSPITAL_COMMUNITY): Payer: Self-pay | Admitting: Psychiatry

## 2015-07-07 ENCOUNTER — Ambulatory Visit (INDEPENDENT_AMBULATORY_CARE_PROVIDER_SITE_OTHER): Payer: 59 | Admitting: Psychiatry

## 2015-07-07 ENCOUNTER — Encounter (HOSPITAL_COMMUNITY): Payer: Self-pay | Admitting: Psychiatry

## 2015-07-07 VITALS — BP 134/87 | HR 92 | Ht 60.0 in | Wt 189.0 lb

## 2015-07-07 DIAGNOSIS — F419 Anxiety disorder, unspecified: Secondary | ICD-10-CM

## 2015-07-07 DIAGNOSIS — F330 Major depressive disorder, recurrent, mild: Secondary | ICD-10-CM

## 2015-07-07 MED ORDER — VENLAFAXINE HCL ER 150 MG PO CP24: 150.0000 mg | ORAL_CAPSULE | Freq: Every day | ORAL | Status: DC

## 2015-07-07 MED ORDER — LAMOTRIGINE 150 MG PO TABS: 150.0000 mg | ORAL_TABLET | Freq: Every day | ORAL | Status: DC

## 2015-07-07 MED ORDER — BUPROPION HCL ER (XL) 150 MG PO TB24: 150.0000 mg | ORAL_TABLET | Freq: Every day | ORAL | Status: DC

## 2015-07-07 NOTE — Progress Notes (Signed)
[facility] [id number] Progress note   [name] [name] [id number] 59 y.o.  07/07/2015 4:29 PM  Chief Complaint:  Medication management and follow-up.       History of Present Illness:  [name] [name] came for her followup appointment.   She is taking her medication as prescribed.  She continues to have memory impairment and she is concerned about her future.  Her mother has Alzheimer's .  Despite recommendation she has not seen neurologist .  But she promised that she will contact to set up an appointment.  Overall she describes her mood has been stable.  She denies any irritability, crying spells, anger, feeling of hopelessness or worthlessness.  She is very happy that her partner and wife is going for counseling and has been a huge improvement in the relationship.  There has been no recent arguments .  Patient like to continue her current psychiatric medication.  She has no rash, itching, tremors, shakes or any issues.  Her appetite is okay.  Her vitals are stable.  Patient denies drinking or using any illegal substances.  Suicidal Ideation: No Plan Formed: No Patient has means to carry out plan: No  Homicidal Ideation: No Plan Formed: No Patient has means to carry out plan: No  Past Psychiatric History/Hospitalization(s) Anxiety: Yes Bipolar Disorder: No Depression: Yes Mania: No Psychosis: No Schizophrenia: No Personality Disorder: No Hospitalization for psychiatric illness: Yes History of Electroconvulsive Shock Therapy: No Prior Suicide Attempts: No  Medical History; Patient has hearing impairment, mild cognitive impairment, knee pain, obstructive sleep apnea, joint pain and obesity.  Her primary care physician is  Theodis Sato , family medicine and her neurologist is Dr Brewing technologist.   Review of Systems: Psychiatric: Agitation: No Hallucination: No Depressed Mood: Yes Insomnia: Yes Hypersomnia: No Altered Concentration: No Feels Worthless: No Grandiose Ideas: No Belief In  Special Powers: No New/Increased Substance Abuse: No Compulsions: No  Neurologic: Headache: No Seizure: No Paresthesias: No Review of Systems  Constitutional: Negative.   HENT: Positive for hearing loss.   Eyes: Negative for blurred vision.  Gastrointestinal: Negative for nausea and vomiting.  Musculoskeletal: Positive for back pain and joint pain.  Skin: Negative for itching and rash.  Neurological: Negative for dizziness, tremors and headaches.        Mild cognitive impairment  Psychiatric/Behavioral: Negative for suicidal ideas. The patient has insomnia.       Outpatient Encounter Prescriptions as of 07/07/2015  Medication Sig  . buPROPion (WELLBUTRIN XL) 150 MG 24 hr tablet Take 1 tablet (150 mg total) by mouth daily.  Marland Kitchen HYDROcodone-acetaminophen (NORCO/VICODIN) 5-325 MG per tablet   . lamoTRIgine (LAMICTAL) 150 MG tablet Take 1 tablet (150 mg total) by mouth daily.  . Multiple Vitamins-Minerals (ALIVE WOMENS 50+ PO) Take by mouth.  . mupirocin nasal ointment (BACTROBAN) 2 % Place 1 application into the nose 2 (two) times daily. Use one-half of tube in each nostril twice daily for five (5) days. After application, press sides of nose together and gently massage.  Marland Kitchen omega-3 acid ethyl esters (LOVAZA) 1 G capsule Take 1 g by mouth daily.  . timolol (TIMOPTIC) 0.5 % ophthalmic solution INT 1 GTT IN [facility] EYE D IN THE MORNING  . travoprost, benzalkonium, (TRAVATAN) 0.004 % ophthalmic solution Place 1 drop into both eyes at bedtime.   Marland Kitchen venlafaxine XR (EFFEXOR-XR) 150 MG 24 hr capsule Take 1 capsule (150 mg total) by mouth daily with breakfast.  . zolpidem (AMBIEN) 10 MG tablet Take 10 mg by  mouth at bedtime as needed for sleep.  . [DISCONTINUED] amoxicillin (AMOXIL) 500 MG capsule   . [DISCONTINUED] buPROPion (WELLBUTRIN XL) 150 MG 24 hr tablet Take 1 tablet (150 mg total) by mouth daily.  . [DISCONTINUED] celecoxib (CELEBREX) 200 MG capsule   . [DISCONTINUED] lamoTRIgine  (LAMICTAL) 150 MG tablet Take 1 tablet (150 mg total) by mouth daily.  . [DISCONTINUED] TRAVATAN Z 0.004 % SOLN ophthalmic solution INT 1 GTT IN OU QHS  . [DISCONTINUED] venlafaxine XR (EFFEXOR-XR) 150 MG 24 hr capsule Take 1 capsule (150 mg total) by mouth daily with breakfast.   No facility-administered encounter medications on file as of 07/07/2015.    No results found for this or any previous visit (from the past 2160 hour(s)).    Physical Exam: Constitutional:  BP 134/87 mmHg  Pulse 92  Ht 5' (1.524 m)  Wt 189 lb (85.73 kg)  BMI 36.91 kg/m2  Musculoskeletal: Strength & Muscle Tone: within normal limits Gait & Station: normal Patient leans: N/A and The patient has a normal posture  Mental Status Examination;   patient is casually dressed and groomed.  She is pleasant and cooperative.  She described her mood euthymic and her affect is appropriate.  Her speech is coherent .  She read lips due to hearing impairment.  She denies any auditory or visual hallucination.  She denies any active or passive suicidal thoughts or homicidal thoughts. There were no flight of ideas or any looseness session.  Her attention and concentration is fair.  She has no tremors or shakes.  There were no paranoia, delusions or any obsessive thoughts at this time.  She's alert and oriented x3.  Her insight judgment and pulse control is okay.     Established Problem, Stable/Improving (1), Review of Psycho-Social Stressors (1), Review of Last Therapy Session (1) and Review of Medication Regimen & Side Effects (2)  Assessment: Axis I: Maj. depressive disorder, recurrent,  Anxiety disorder NOS  Axis II: Deferred  Axis III:  Past Medical History  Diagnosis Date  . Memory changes   . Deafness     Wears Bilat hearing aids since Childhood  . Obese   . Mild cognitive impairment, so stated 07/16/2013    Patient subjectively complains of word finding and memory deficits. MOCA 30-30 on 07-16-13 , paraphrasic  errors- uses words such as  closet for garage   . Anxiety   . Depression   . Hyperlipidemia   . Arthritis   . OSA (obstructive sleep apnea)     uses BIPAP  . Glaucoma   . 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  One more time I encouraged to see a neurologist for cognitive impairment .  She is getting Ambien from her primary care physician.  Patient denies any side effects of medication.  I will continue Wellbutrin 150 mg daily, Effexor 150 mg daily and Lamictal 150 mg daily.  She has no rash itching or any side effects.  She has no tremors or shakes. Discussed medication side effects and benefits.  Discuss safety concern and recommended to call 911 or go to the local emergency room if she started to have suicidal thoughts or homicidal thought.  Encouraged to keep appointment with her therapist for coping and social skills.  Follow-up in 3 months.    Keil Pickering T., MD 07/07/2015

## 2015-07-12 ENCOUNTER — Ambulatory Visit: Payer: Self-pay

## 2015-07-15 DIAGNOSIS — E669 Obesity, unspecified: Secondary | ICD-10-CM | POA: Insufficient documentation

## 2015-07-19 ENCOUNTER — Ambulatory Visit
Admission: RE | Admit: 2015-07-19 | Discharge: 2015-07-19 | Disposition: A | Discharge status: Home / Self Care | Payer: 59 | Source: Ambulatory Visit | Attending: Family | Admitting: Family

## 2015-07-19 DIAGNOSIS — Z1231 Encounter for screening mammogram for malignant neoplasm of breast: Secondary | ICD-10-CM

## 2015-08-06 ENCOUNTER — Ambulatory Visit: Payer: Self-pay

## 2015-08-26 ENCOUNTER — Other Ambulatory Visit (HOSPITAL_COMMUNITY): Payer: Self-pay | Admitting: Orthopedic Surgery

## 2015-08-26 DIAGNOSIS — Z471 Aftercare following joint replacement surgery: Secondary | ICD-10-CM

## 2015-08-26 DIAGNOSIS — Z96652 Presence of left artificial knee joint: Principal | ICD-10-CM

## 2015-09-06 ENCOUNTER — Encounter (HOSPITAL_COMMUNITY)
Admission: RE | Admit: 2015-09-06 | Discharge: 2015-09-06 | Disposition: A | Discharge status: Home / Self Care | Payer: 59 | Source: Ambulatory Visit | Attending: Orthopedic Surgery | Admitting: Orthopedic Surgery

## 2015-09-06 DIAGNOSIS — Z9889 Other specified postprocedural states: Secondary | ICD-10-CM | POA: Diagnosis present

## 2015-09-06 DIAGNOSIS — Z471 Aftercare following joint replacement surgery: Secondary | ICD-10-CM

## 2015-09-06 DIAGNOSIS — Z96653 Presence of artificial knee joint, bilateral: Secondary | ICD-10-CM | POA: Insufficient documentation

## 2015-09-06 DIAGNOSIS — Z96652 Presence of left artificial knee joint: Secondary | ICD-10-CM

## 2015-09-06 MED ORDER — TECHNETIUM TC 99M MEDRONATE IV KIT
21.8000 | PACK | Freq: Once | INTRAVENOUS | Status: AC | PRN
  Administered 2015-09-06: 21.8 via INTRAVENOUS

## 2015-10-06 ENCOUNTER — Ambulatory Visit (INDEPENDENT_AMBULATORY_CARE_PROVIDER_SITE_OTHER): Payer: 59 | Admitting: Psychiatry

## 2015-10-06 ENCOUNTER — Encounter (HOSPITAL_COMMUNITY): Payer: Self-pay | Admitting: Psychiatry

## 2015-10-06 VITALS — BP 133/89 | HR 88 | Ht 60.0 in | Wt 208.6 lb

## 2015-10-06 DIAGNOSIS — F330 Major depressive disorder, recurrent, mild: Secondary | ICD-10-CM

## 2015-10-06 DIAGNOSIS — F419 Anxiety disorder, unspecified: Secondary | ICD-10-CM | POA: Diagnosis not present

## 2015-10-06 MED ORDER — LAMOTRIGINE 150 MG PO TABS: 150.0000 mg | ORAL_TABLET | Freq: Every day | ORAL | Status: DC

## 2015-10-06 MED ORDER — BUPROPION HCL ER (XL) 150 MG PO TB24: 150.0000 mg | ORAL_TABLET | Freq: Every day | ORAL | Status: DC

## 2015-10-06 MED ORDER — VENLAFAXINE HCL ER 150 MG PO CP24: 150.0000 mg | ORAL_CAPSULE | Freq: Every day | ORAL | Status: DC

## 2015-10-06 NOTE — Progress Notes (Signed)
[facility] [phone] Progress note   [name] [name] [id number] 60 y.o.  10/06/2015 4:32 PM  Chief Complaint:  Medication management and follow-up.       History of Present Illness:  [name] [name] came for her followup appointment.   She is compliant with her medication and denies any side effects.  Recently she was given Lyrica 75 mg twice a day for her joint pain .  She is hoping that it helps her knee pain.  Overall she described her mood is a stable.  She denies any irritability, anger, mood swing.  She is taking Effexor, Wellbutrin and Lamictal.  She has no rash itching or any headaches.  She had a very good Christmas.  She endorse a good relationship with her partner and wife.  There has been no recent issues.  She denies any mania or psychosis.  She is sleeping better other than she has some time insomnia due to pain.  She cut down her Ambien and takes 5 mg only as needed.  Patient denies drinking or using any illegal substances.  Her appetite is okay.  She has gained weight as she is not able to do exercise due to knee joint pain.    Suicidal Ideation: No Plan Formed: No Patient has means to carry out plan: No  Homicidal Ideation: No Plan Formed: No Patient has means to carry out plan: No  Past Psychiatric History/Hospitalization(s) Anxiety: Yes Bipolar Disorder: No Depression: Yes Mania: No Psychosis: No Schizophrenia: No Personality Disorder: No Hospitalization for psychiatric illness: Yes History of Electroconvulsive Shock Therapy: No Prior Suicide Attempts: No  Medical History; Patient has hearing impairment, mild cognitive impairment, knee pain, obstructive sleep apnea, joint pain and obesity.  Her primary care physician is  Theodis Sato , family medicine and her neurologist is Dr Brewing technologist.   Review of Systems: Psychiatric: Agitation: No Hallucination: No Depressed Mood: Yes Insomnia: Yes Hypersomnia: No Altered Concentration: No Feels Worthless: No Grandiose  Ideas: No Belief In Special Powers: No New/Increased Substance Abuse: No Compulsions: No  Neurologic: Headache: No Seizure: No Paresthesias: No Review of Systems  Constitutional: Negative.   HENT: Positive for hearing loss.   Eyes: Negative for blurred vision.  Gastrointestinal: Negative for nausea and vomiting.  Musculoskeletal: Positive for back pain and joint pain.  Skin: Negative for itching and rash.  Neurological: Negative for dizziness, tremors and headaches.        Mild cognitive impairment  Psychiatric/Behavioral: Negative for suicidal ideas.      Outpatient Encounter Prescriptions as of 10/06/2015  Medication Sig  . buPROPion (WELLBUTRIN XL) 150 MG 24 hr tablet Take 1 tablet (150 mg total) by mouth daily.  Marland Kitchen HYDROcodone-acetaminophen (NORCO/VICODIN) 5-325 MG per tablet   . lamoTRIgine (LAMICTAL) 150 MG tablet Take 1 tablet (150 mg total) by mouth daily.  Marland Kitchen LYRICA 75 MG capsule Take 75 mg by mouth 2 (two) times daily.  . Multiple Vitamins-Minerals (ALIVE WOMENS 50+ PO) Take by mouth.  . mupirocin nasal ointment (BACTROBAN) 2 % Place 1 application into the nose 2 (two) times daily. Use one-half of tube in each nostril twice daily for five (5) days. After application, press sides of nose together and gently massage.  Marland Kitchen omega-3 acid ethyl esters (LOVAZA) 1 G capsule Take 1 g by mouth daily.  . timolol (TIMOPTIC) 0.5 % ophthalmic solution INT 1 GTT IN [facility] EYE D IN THE MORNING  . travoprost, benzalkonium, (TRAVATAN) 0.004 % ophthalmic solution Place 1 drop into both eyes at  bedtime.   Marland Kitchen venlafaxine XR (EFFEXOR-XR) 150 MG 24 hr capsule Take 1 capsule (150 mg total) by mouth daily with breakfast.  . zolpidem (AMBIEN) 5 MG tablet 5 mg.  . [DISCONTINUED] buPROPion (WELLBUTRIN XL) 150 MG 24 hr tablet Take 1 tablet (150 mg total) by mouth daily.  . [DISCONTINUED] lamoTRIgine (LAMICTAL) 150 MG tablet Take 1 tablet (150 mg total) by mouth daily.  . [DISCONTINUED] venlafaxine XR  (EFFEXOR-XR) 150 MG 24 hr capsule Take 1 capsule (150 mg total) by mouth daily with breakfast.  . [DISCONTINUED] zolpidem (AMBIEN) 10 MG tablet Take 10 mg by mouth at bedtime as needed for sleep.   No facility-administered encounter medications on file as of 10/06/2015.    No results found for this or any previous visit (from the past 2160 hour(s)).    Physical Exam: Constitutional:  BP 133/89 mmHg  Pulse 88  Ht 5' (1.524 m)  Wt 208 lb 9.6 oz (94.62 kg)  BMI 40.74 kg/m2  Musculoskeletal: Strength & Muscle Tone: within normal limits Gait & Station: normal Patient leans: N/A and The patient has a normal posture  Mental Status Examination;   patient is casually dressed and groomed.  She is pleasant and cooperative.  She described her mood euthymic and her affect is appropriate.  Her speech is coherent .  She read lips due to hearing impairment.  She denies any auditory or visual hallucination.  She denies any active or passive suicidal thoughts or homicidal thoughts. There were no flight of ideas or any looseness session.  Her attention and concentration is fair.  She has no tremors or shakes.  There were no paranoia, delusions or any obsessive thoughts at this time.  She's alert and oriented x3.  Her insight judgment and pulse control is okay.     Established Problem, Stable/Improving (1), Review of Psycho-Social Stressors (1), Review of Last Therapy Session (1) and Review of Medication Regimen & Side Effects (2)  Assessment: Axis I: Maj. depressive disorder, recurrent,  Anxiety disorder NOS  Axis II: Deferred  Axis III:  Past Medical History  Diagnosis Date  . Memory changes   . Deafness     Wears Bilat hearing aids since Childhood  . Obese   . Mild cognitive impairment, so stated 07/16/2013    Patient subjectively complains of word finding and memory deficits. MOCA 30-30 on 07-16-13 , paraphrasic errors- uses words such as  closet for garage   . Anxiety   . Depression   .  Hyperlipidemia   . Arthritis   . OSA (obstructive sleep apnea)     uses BIPAP  . Glaucoma   . 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  She has gained weight as she is not able to do walking or exercise.  She is hoping Lyrica may help her pain and she can go back to do exercise.  She is getting Lyrica and Ambien from other providers.  I will continue Effexor XR 150 mg daily, Wellbutrin 150 mg daily and Lamictal 150 mg daily.  Patient has no rash or itching.  Recommended to call us back if she has any question or any concern.  Follow-up in 3 months. Discuss safety concern and recommended to call 911 or go to the local emergency room if she started to have suicidal thoughts or homicidal thought.    ARFEEN,SYED T., MD 10/06/2015

## 2015-10-10 ENCOUNTER — Other Ambulatory Visit (HOSPITAL_COMMUNITY): Payer: Self-pay | Admitting: Psychiatry

## 2015-10-11 ENCOUNTER — Other Ambulatory Visit (HOSPITAL_COMMUNITY): Payer: Self-pay | Admitting: Psychiatry

## 2016-01-06 ENCOUNTER — Ambulatory Visit (HOSPITAL_COMMUNITY): Payer: Self-pay | Admitting: Psychiatry

## 2016-01-19 ENCOUNTER — Other Ambulatory Visit (HOSPITAL_COMMUNITY): Payer: Self-pay | Admitting: Psychiatry

## 2016-01-19 ENCOUNTER — Ambulatory Visit (HOSPITAL_COMMUNITY): Payer: Self-pay | Admitting: Psychiatry

## 2016-01-20 ENCOUNTER — Ambulatory Visit (INDEPENDENT_AMBULATORY_CARE_PROVIDER_SITE_OTHER): Payer: 59 | Admitting: Psychiatry

## 2016-01-20 ENCOUNTER — Encounter (HOSPITAL_COMMUNITY): Payer: Self-pay | Admitting: Psychiatry

## 2016-01-20 ENCOUNTER — Ambulatory Visit (HOSPITAL_COMMUNITY): Payer: Self-pay | Admitting: Psychiatry

## 2016-01-20 VITALS — BP 158/100 | HR 81

## 2016-01-20 DIAGNOSIS — F419 Anxiety disorder, unspecified: Secondary | ICD-10-CM | POA: Diagnosis not present

## 2016-01-20 DIAGNOSIS — F330 Major depressive disorder, recurrent, mild: Secondary | ICD-10-CM | POA: Diagnosis not present

## 2016-01-20 MED ORDER — BUPROPION HCL ER (XL) 150 MG PO TB24: 150.0000 mg | ORAL_TABLET | Freq: Every day | ORAL | Status: DC

## 2016-01-20 MED ORDER — ZOLPIDEM TARTRATE 5 MG PO TABS: 5.0000 mg | ORAL_TABLET | Freq: Every evening | ORAL | Status: DC | PRN

## 2016-01-20 MED ORDER — VENLAFAXINE HCL ER 150 MG PO CP24: 150.0000 mg | ORAL_CAPSULE | Freq: Every day | ORAL | Status: DC

## 2016-01-20 MED ORDER — LAMOTRIGINE 150 MG PO TABS: 150.0000 mg | ORAL_TABLET | Freq: Every day | ORAL | Status: DC

## 2016-01-20 NOTE — Progress Notes (Signed)
[facility] [id number] Progress note   [name] [id number] 60 y.o.  01/20/2016 3:35 PM  Chief Complaint:  Medication management and follow-up.       History of Present Illness:  [name] [name] came for her followup appointment.   She is taking her medication as prescribed.  She like to Lyrica however she still have residual pain and she has not started that her exercise.  But her overall her anxiety , depression is much under control.  She sleeping better.  She recently seen her physician however she did not describe Ambien and she was told to see psychiatrist.  Patient is taking Wellbutrin, Effexor, Lamictal .  She has no tremors, shakes or any side effects.  Patient denies drinking or using any illegal substances.  Her appetite is okay.  She is rushing today for her appointment and her blood pressure is slightly high but she has no dizziness, chest pain or any headaches.  Suicidal Ideation: No Plan Formed: No Patient has means to carry out plan: No  Homicidal Ideation: No Plan Formed: No Patient has means to carry out plan: No  Past Psychiatric History/Hospitalization(s) Anxiety: Yes Bipolar Disorder: No Depression: Yes Mania: No Psychosis: No Schizophrenia: No Personality Disorder: No Hospitalization for psychiatric illness: Yes History of Electroconvulsive Shock Therapy: No Prior Suicide Attempts: No  Medical History; Patient has hearing impairment, mild cognitive impairment, knee pain, obstructive sleep apnea, joint pain and obesity.  Her primary care physician is  Theodis Satoebra Smothers , family medicine and her neurologist is Dr Brewing technologistDolmier.   Review of Systems: Psychiatric: Agitation: No Hallucination: No Depressed Mood: Yes Insomnia: Yes Hypersomnia: No Altered Concentration: No Feels Worthless: No Grandiose Ideas: No Belief In Special Powers: No New/Increased Substance Abuse: No Compulsions: No  Neurologic: Headache: No Seizure: No Paresthesias: No Review of  Systems  Constitutional: Negative.   HENT: Positive for hearing loss.   Eyes: Negative for blurred vision.  Gastrointestinal: Negative for nausea and vomiting.  Musculoskeletal: Positive for back pain and joint pain.  Skin: Negative for itching and rash.  Neurological: Negative for dizziness, tremors and headaches.        Mild cognitive impairment  Psychiatric/Behavioral: Negative for suicidal ideas.      Outpatient Encounter Prescriptions as of 01/20/2016  Medication Sig  . buPROPion (WELLBUTRIN XL) 150 MG 24 hr tablet Take 1 tablet (150 mg total) by mouth daily.  Marland Kitchen. HYDROcodone-acetaminophen (NORCO/VICODIN) 5-325 MG per tablet   . lamoTRIgine (LAMICTAL) 150 MG tablet Take 1 tablet (150 mg total) by mouth daily.  Marland Kitchen. LYRICA 75 MG capsule Take 75 mg by mouth 2 (two) times daily.  . Multiple Vitamins-Minerals (ALIVE WOMENS 50+ PO) Take by mouth.  . mupirocin nasal ointment (BACTROBAN) 2 % Place 1 application into the nose 2 (two) times daily. Use one-half of tube in each nostril twice daily for five (5) days. After application, press sides of nose together and gently massage.  Marland Kitchen. omega-3 acid ethyl esters (LOVAZA) 1 G capsule Take 1 g by mouth daily.  . timolol (TIMOPTIC) 0.5 % [facility]  . travoprost, benzalkonium, (TRAVATAN) 0.004 % ophthalmic solution Place 1 drop into both eyes at bedtime.   Marland Kitchen. venlafaxine XR (EFFEXOR-XR) 150 MG 24 hr capsule Take 1 capsule (150 mg total) by mouth daily with breakfast.  . zolpidem (AMBIEN) 5 MG tablet Take 1 tablet (5 mg total) by mouth at bedtime as needed for sleep.  . [DISCONTINUED]  buPROPion (WELLBUTRIN XL) 150 MG 24 hr tablet Take 1 tablet (150 mg total) by mouth daily.  . [DISCONTINUED] lamoTRIgine (LAMICTAL) 150 MG tablet Take 1 tablet (150 mg total) by mouth daily.  . [DISCONTINUED] venlafaxine XR (EFFEXOR-XR) 150 MG 24 hr capsule Take 1 capsule (150 mg total) by mouth daily with breakfast.  .  [DISCONTINUED] zolpidem (AMBIEN) 5 MG tablet 5 mg.   No facility-administered encounter medications on file as of 01/20/2016.    No results found for this or any previous visit (from the past 2160 hour(s)).    Physical Exam: Constitutional:  BP 158/100 mmHg  Pulse 81  Wt   Musculoskeletal: Strength & Muscle Tone: within normal limits Gait & Station: normal Patient leans: N/A and The patient has a normal posture  Mental Status Examination;  Patient is casually dressed and groomed.  She is pleasant and cooperative.  She described her mood euthymic and her affect is appropriate.  Her speech is coherent .She read lips due to hearing impairment.  She denies any auditory or visual hallucination.  She denies any active or passive suicidal thoughts or homicidal thoughts. There were no flight of ideas or any looseness session.  Her attention and concentration is fair.  She has no tremors or shakes.  There were no paranoia, delusions or any obsessive thoughts at this time.  She's alert and oriented x3.  Her insight judgment and pulse control is okay.     Established Problem, Stable/Improving (1), Review of Psycho-Social Stressors (1), Review of Last Therapy Session (1) and Review of Medication Regimen & Side Effects (2)  Assessment: Axis I: Maj. depressive disorder, recurrent,  Anxiety disorder NOS  Axis II: Deferred  Axis III:  Past Medical History  Diagnosis Date  . Memory changes   . Deafness     Wears Bilat hearing aids since Childhood  . Obese   . Mild cognitive impairment, so stated 07/16/2013    Patient subjectively complains of word finding and memory deficits. MOCA 30-30 on 07-16-13 , paraphrasic errors- uses words such as  closet for garage   . Anxiety   . Depression   . Hyperlipidemia   . Arthritis   . OSA (obstructive sleep apnea)     uses BIPAP  . Glaucoma   . 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  She has no side effects. She also like to get Ambien from this office.  We will provide 5 mg Ambien to take 1 tablet as needed for insomnia.. She is getting Lyrica from other providers.  I will continue Effexor XR 150 mg daily, Wellbutrin 150 mg daily and Lamictal 150 mg daily.  Patient has no rash or itching.  Recommended to call us back if she has any question or any concern.  Follow-up in 3 months.  Rinaldo Macqueen T., MD 01/20/2016

## 2016-02-13 ENCOUNTER — Other Ambulatory Visit (HOSPITAL_COMMUNITY): Payer: Self-pay | Admitting: Psychiatry

## 2016-03-19 ENCOUNTER — Other Ambulatory Visit (HOSPITAL_COMMUNITY): Payer: Self-pay | Admitting: Psychiatry

## 2016-03-19 DIAGNOSIS — F330 Major depressive disorder, recurrent, mild: Secondary | ICD-10-CM

## 2016-04-19 ENCOUNTER — Other Ambulatory Visit (HOSPITAL_COMMUNITY): Payer: Self-pay | Admitting: Psychiatry

## 2016-04-19 DIAGNOSIS — F330 Major depressive disorder, recurrent, mild: Secondary | ICD-10-CM

## 2016-04-24 ENCOUNTER — Ambulatory Visit (HOSPITAL_COMMUNITY): Payer: Self-pay | Admitting: Psychiatry

## 2016-05-02 ENCOUNTER — Other Ambulatory Visit (HOSPITAL_COMMUNITY): Payer: Self-pay | Admitting: Psychiatry

## 2016-05-02 DIAGNOSIS — F330 Major depressive disorder, recurrent, mild: Secondary | ICD-10-CM

## 2016-05-04 NOTE — Telephone Encounter (Signed)
Met with Dr. Adele Schilder who reviewed patient's record and authorized a refill of patient's prescribed Lamictal.  New order e-scribed to patient's [facility] in [place] as approved.

## 2016-05-21 ENCOUNTER — Other Ambulatory Visit (HOSPITAL_COMMUNITY): Payer: Self-pay | Admitting: Psychiatry

## 2016-05-21 DIAGNOSIS — F330 Major depressive disorder, recurrent, mild: Secondary | ICD-10-CM

## 2016-05-25 ENCOUNTER — Other Ambulatory Visit (HOSPITAL_COMMUNITY): Payer: Self-pay

## 2016-05-25 DIAGNOSIS — F330 Major depressive disorder, recurrent, mild: Secondary | ICD-10-CM

## 2016-05-25 MED ORDER — ZOLPIDEM TARTRATE 5 MG PO TABS: 5.0000 mg | ORAL_TABLET | Freq: Every evening | ORAL | 0 refills | Status: DC | PRN

## 2016-06-08 ENCOUNTER — Ambulatory Visit (HOSPITAL_COMMUNITY): Payer: Self-pay | Admitting: Psychiatry

## 2016-06-15 ENCOUNTER — Other Ambulatory Visit (HOSPITAL_COMMUNITY): Payer: Self-pay | Admitting: Psychiatry

## 2016-06-15 DIAGNOSIS — F330 Major depressive disorder, recurrent, mild: Secondary | ICD-10-CM

## 2016-06-20 ENCOUNTER — Other Ambulatory Visit (HOSPITAL_COMMUNITY): Payer: Self-pay

## 2016-06-20 DIAGNOSIS — F330 Major depressive disorder, recurrent, mild: Secondary | ICD-10-CM

## 2016-06-20 MED ORDER — VENLAFAXINE HCL ER 150 MG PO CP24: ORAL_CAPSULE | ORAL | 0 refills | Status: DC

## 2016-07-20 ENCOUNTER — Other Ambulatory Visit (HOSPITAL_COMMUNITY): Payer: Self-pay | Admitting: Psychiatry

## 2016-07-20 DIAGNOSIS — F330 Major depressive disorder, recurrent, mild: Secondary | ICD-10-CM

## 2016-07-21 ENCOUNTER — Other Ambulatory Visit (HOSPITAL_COMMUNITY): Payer: Self-pay | Admitting: Psychiatry

## 2016-07-25 ENCOUNTER — Ambulatory Visit (INDEPENDENT_AMBULATORY_CARE_PROVIDER_SITE_OTHER): Payer: PRIVATE HEALTH INSURANCE | Admitting: Psychiatry

## 2016-07-25 ENCOUNTER — Other Ambulatory Visit (HOSPITAL_COMMUNITY): Payer: Self-pay | Admitting: Psychiatry

## 2016-07-25 ENCOUNTER — Encounter (HOSPITAL_COMMUNITY): Payer: Self-pay | Admitting: Psychiatry

## 2016-07-25 DIAGNOSIS — F330 Major depressive disorder, recurrent, mild: Secondary | ICD-10-CM

## 2016-07-25 DIAGNOSIS — Z818 Family history of other mental and behavioral disorders: Secondary | ICD-10-CM | POA: Diagnosis not present

## 2016-07-25 DIAGNOSIS — Z79899 Other long term (current) drug therapy: Secondary | ICD-10-CM

## 2016-07-25 DIAGNOSIS — Z87891 Personal history of nicotine dependence: Secondary | ICD-10-CM | POA: Diagnosis not present

## 2016-07-25 DIAGNOSIS — Z811 Family history of alcohol abuse and dependence: Secondary | ICD-10-CM | POA: Diagnosis not present

## 2016-07-25 MED ORDER — VENLAFAXINE HCL ER 150 MG PO CP24: ORAL_CAPSULE | ORAL | 1 refills | Status: DC

## 2016-07-25 MED ORDER — LAMOTRIGINE 150 MG PO TABS: 150.0000 mg | ORAL_TABLET | Freq: Every day | ORAL | 1 refills | Status: DC

## 2016-07-25 MED ORDER — BUPROPION HCL ER (XL) 150 MG PO TB24: 150.0000 mg | ORAL_TABLET | Freq: Every day | ORAL | 2 refills | Status: DC

## 2016-07-25 MED ORDER — ZOLPIDEM TARTRATE 5 MG PO TABS: 5.0000 mg | ORAL_TABLET | Freq: Every evening | ORAL | 2 refills | Status: DC | PRN

## 2016-07-25 NOTE — Progress Notes (Signed)
BH MD/PA/NP OP Progress Note  07/25/2016 2:57 PM [name] [name] L Silver  MRN:  [id number]  Chief Complaint:  Chief Complaint    Depression     Subjective:  I lost my job.  I have depression because of financially strained.  HPI: [name] [name] came for her follow-up appointment.  She endorse lately feeling more sad depressed and anxious because she lost her job after working for 14 years at [employer].  She mentioned her job was outsourced overseas and she tried other department to work but did not go very well.  She's been trying to get a job and have sent multiple applications every day.  She is very nervous and anxious about her finances.  However she believe her depression is situational and once she get the job she will be fine.  She admitted lately more tearful, sad and frustrated but denies any hallucination, paranoia, suicidal thoughts or homicidal thought.  She is taking her medication and reported no side effects.  She is trying to budget her finances and this year she has no Christmas tree because she wanted to save money for the future.  Her relationship with her partner is going very well.  She has no issues.  She denies any feeling of hopelessness or worthlessness.  She denies any paranoia or any hallucination.  Her energy level is fair.  Her vital signs are stable.  She sleeping good.  Visit Diagnosis:    ICD-9-CM ICD-10-CM   1. Major depressive disorder, recurrent episode, mild (HCC) 296.31 F33.0 zolpidem (AMBIEN) 5 MG tablet     lamoTRIgine (LAMICTAL) 150 MG tablet     venlafaxine XR (EFFEXOR-XR) 150 MG 24 hr capsule     buPROPion (WELLBUTRIN XL) 150 MG 24 hr tablet    Past Psychiatric History: Patient has 1 hospitalization in 2010 1 her brother committed suicide and she was feeling very depressed [facility]  She is taking antidepressants since 2008.  She had tried Prozac and Paxil.  She remember Prozac make her more manic and Paxil did not help.  She also tried  Abilify which caused headaches and diarrhea.  Patient denies any history of suicidal attempt, hallucination, psychosis or mania.  Past Medical History:  Past Medical History:  Diagnosis Date  . Anxiety   . Arthritis   . Deafness    Wears Bilat hearing aids since Childhood  . Depression   . Glaucoma   . Hyperlipidemia   . Memory changes   . Mild cognitive impairment, so stated 07/16/2013   Patient subjectively complains of word finding and memory deficits. MOCA 30-30 on 07-16-13 , paraphrasic errors- uses words such as  closet for garage   . Obese   . OSA (obstructive sleep apnea)    uses BIPAP  . 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 FOOT SURGERY    . REPLACEMENT TOTAL KNEE Bilateral 06/2006  . TOTAL KNEE ARTHROPLASTY Left 11/21/2013   Procedure: LEFT TOTAL KNEE  ARTHROPLASTY REVISION;  Surgeon: Loanne DrillingFrank V Aluisio, MD;  Location: [facility];  Service: Orthopedics;  Laterality: Left;  . TOTAL VAGINAL HYSTERECTOMY  2007   COMPLETE    Family Psychiatric History: See below.  Family History:  Family History  Problem Relation Age of Onset  . Depression Father   . Depression Paternal Grandmother   . Depression Mother   . Alcohol abuse Mother   . Depression Brother   . Alcohol  abuse Brother     Social History:  Social History   Social History  . Marital status: Married    Spouse name: N/A  . Number of children: 0  . Years of education: N/A   Occupational History  .  [employer]   Social History Main Topics  . Smoking status: Former Smoker    Quit date: 11/18/1988  . Smokeless tobacco: None     Comment: Pt quit 17 yrs ago/smoked for 12 yrs  . Alcohol use 1.2 - 1.8 oz/week    2 - 3 Glasses of wine per week     Comment: social drinker/Caffeine 4-6 cups a day  . Drug use: No  . Sexual activity: Not Currently    Partners: Female   Other Topics Concern  . None   Social History Narrative   Works  full-time @ [employer]    Has a cat,3 dogs    lives with Partner(Female)[name] [name]    Allergies:  Allergies  Allergen Reactions  . Gramineae Pollens   . Statins     Metabolic Disorder Labs: Lab Results  Component Value Date   HGBA1C 5.8 (H) 07/16/2013   No results found for: PROLACTIN No results found for: CHOL, TRIG, HDL, CHOLHDL, VLDL, LDLCALC   Current Medications: Current Outpatient Prescriptions  Medication Sig Dispense Refill  . buPROPion (WELLBUTRIN XL) 150 MG 24 hr tablet Take 1 tablet (150 mg total) by mouth daily. 30 tablet 2  . HYDROcodone-acetaminophen (NORCO/VICODIN) 5-325 MG per tablet   0  . lamoTRIgine (LAMICTAL) 150 MG tablet Take 1 tablet (150 mg total) by mouth daily. 30 tablet 1  . Multiple Vitamins-Minerals (ALIVE WOMENS 50+ PO) Take by mouth.    . mupirocin nasal ointment (BACTROBAN) 2 % Place 1 application into the nose 2 (two) times daily. Use one-half of tube in each nostril twice daily for five (5) days. After application, press sides of nose together and gently massage.    Marland Kitchen omega-3 acid ethyl esters (LOVAZA) 1 G capsule Take 1 g by mouth daily.    . timolol (TIMOPTIC) 0.5 % ophthalmic solution INT 1 GTT IN EACH EYE D IN THE MORNING  3  . travoprost, benzalkonium, (TRAVATAN) 0.004 % ophthalmic solution Place 1 drop into both eyes at bedtime.     Marland Kitchen venlafaxine XR (EFFEXOR-XR) 150 MG 24 hr capsule TAKE 1 CAPSULE(150 MG) BY MOUTH DAILY WITH BREAKFAST 30 capsule 1  . zolpidem (AMBIEN) 5 MG tablet Take 1 tablet (5 mg total) by mouth at bedtime as needed for sleep. 15 tablet 2   No current facility-administered medications for this visit.     Neurologic: Headache: No Seizure: No Paresthesias: No  Musculoskeletal: Strength & Muscle Tone: within normal limits Gait & Station: Difficulty walking due to left knee pain Patient leans: Left  Psychiatric Specialty Exam: Review of Systems  HENT: Positive for hearing loss.   Psychiatric/Behavioral:       Mild  cognitive impairment    Blood pressure (!) 138/96, pulse 76, height 5' (1.524 m), weight 198 lb (89.8 kg).Body mass index is 38.67 kg/m.  General Appearance: Casual  Eye Contact:  Good  Speech:  Slow  Volume:  Decreased  Mood:  Anxious and Dysphoric  Affect:  Constricted  Thought Process:  Goal Directed and Linear  Orientation:  Full (Time, Place, and Person)  Thought Content: WDL and Logical   Suicidal Thoughts:  No  Homicidal Thoughts:  No  Memory:  Immediate;   Fair Recent;  Fair Remote;   Fair  Judgement:  Good  Insight:  Good  Psychomotor Activity:  Normal  Concentration:  Concentration: Fair and Attention Span: Fair  Recall:  Good  Fund of Knowledge: Good  Language: Good  Akathisia:  No  Handed:  Right  AIMS (if indicated):  None reported   Assets:  Communication Skills Desire for Improvement Housing  ADL's:  Intact  Cognition: Impaired,  Mild  Sleep:  Good      Treatment Plan Summary:Axis I; major depressive disorder, recurrent.  Anxiety disorder NOS   Plan; Patient is more depressed and sad however she believe it is situational and due to lack of job but she had applied multiple places and she is hoping to get a job soon.  She does not want to change her medication at this time he like to continue her current psychiatric medication.  She is also taking hydrocodone from her left knee pain and no longer taking Lyrica.  Review her medication and discussed medication side effects and benefits.  I will continue Ambien 5 mg when necessary at bedtime for insomnia, Effexor XR 150 mg daily, Wellbutrin 150 mg daily and Lamictal 150 mg daily.  Patient has no tremors shakes or any EPS.  She has no rash itching or any headaches.  Recommended to call us back if she has any question or any concern.  Follow-up in 3 months.   ARFEEN,SYED T., MD 07/25/2016, 2:57 PM

## 2016-08-10 DIAGNOSIS — G894 Chronic pain syndrome: Secondary | ICD-10-CM | POA: Insufficient documentation

## 2016-08-10 DIAGNOSIS — M1712 Unilateral primary osteoarthritis, left knee: Secondary | ICD-10-CM | POA: Insufficient documentation

## 2016-08-21 ENCOUNTER — Other Ambulatory Visit: Payer: Self-pay | Admitting: Family

## 2016-08-21 DIAGNOSIS — Z1231 Encounter for screening mammogram for malignant neoplasm of breast: Secondary | ICD-10-CM

## 2016-09-17 ENCOUNTER — Other Ambulatory Visit (HOSPITAL_COMMUNITY): Payer: Self-pay | Admitting: Psychiatry

## 2016-09-17 DIAGNOSIS — F330 Major depressive disorder, recurrent, mild: Secondary | ICD-10-CM

## 2016-09-20 ENCOUNTER — Ambulatory Visit
Admission: RE | Admit: 2016-09-20 | Discharge: 2016-09-20 | Disposition: A | Discharge status: Home / Self Care | Payer: PRIVATE HEALTH INSURANCE | Source: Ambulatory Visit | Attending: Family | Admitting: Family

## 2016-09-20 DIAGNOSIS — Z1231 Encounter for screening mammogram for malignant neoplasm of breast: Secondary | ICD-10-CM

## 2016-09-22 ENCOUNTER — Other Ambulatory Visit (HOSPITAL_COMMUNITY): Payer: Self-pay | Admitting: Psychiatry

## 2016-09-26 ENCOUNTER — Encounter (HOSPITAL_COMMUNITY): Payer: Self-pay | Admitting: Psychiatry

## 2016-09-26 ENCOUNTER — Ambulatory Visit (INDEPENDENT_AMBULATORY_CARE_PROVIDER_SITE_OTHER): Payer: PRIVATE HEALTH INSURANCE | Admitting: Psychiatry

## 2016-09-26 DIAGNOSIS — Z90710 Acquired absence of both cervix and uterus: Secondary | ICD-10-CM

## 2016-09-26 DIAGNOSIS — F1099 Alcohol use, unspecified with unspecified alcohol-induced disorder: Secondary | ICD-10-CM

## 2016-09-26 DIAGNOSIS — H903 Sensorineural hearing loss, bilateral: Secondary | ICD-10-CM | POA: Diagnosis not present

## 2016-09-26 DIAGNOSIS — Z96653 Presence of artificial knee joint, bilateral: Secondary | ICD-10-CM

## 2016-09-26 DIAGNOSIS — Z811 Family history of alcohol abuse and dependence: Secondary | ICD-10-CM

## 2016-09-26 DIAGNOSIS — Z79899 Other long term (current) drug therapy: Secondary | ICD-10-CM

## 2016-09-26 DIAGNOSIS — Z87891 Personal history of nicotine dependence: Secondary | ICD-10-CM

## 2016-09-26 DIAGNOSIS — Z9889 Other specified postprocedural states: Secondary | ICD-10-CM

## 2016-09-26 DIAGNOSIS — Z818 Family history of other mental and behavioral disorders: Secondary | ICD-10-CM

## 2016-09-26 DIAGNOSIS — Z888 Allergy status to other drugs, medicaments and biological substances status: Secondary | ICD-10-CM

## 2016-09-26 DIAGNOSIS — F330 Major depressive disorder, recurrent, mild: Secondary | ICD-10-CM

## 2016-09-26 MED ORDER — BUPROPION HCL ER (XL) 150 MG PO TB24: 150.0000 mg | ORAL_TABLET | Freq: Every day | ORAL | 2 refills | Status: DC

## 2016-09-26 MED ORDER — VENLAFAXINE HCL ER 150 MG PO CP24: ORAL_CAPSULE | ORAL | 2 refills | Status: DC

## 2016-09-26 MED ORDER — ZOLPIDEM TARTRATE 5 MG PO TABS
5.0000 mg | ORAL_TABLET | Freq: Every evening | ORAL | 2 refills | Status: DC | PRN
Start: 2016-09-26 — End: 2016-12-25

## 2016-09-26 MED ORDER — LAMOTRIGINE 150 MG PO TABS: 150.0000 mg | ORAL_TABLET | Freq: Every day | ORAL | 2 refills | Status: DC

## 2016-09-26 NOTE — Progress Notes (Signed)
BH MD/PA/NP OP Progress Note  09/26/2016 2:24 PM [name] [name]  MRN:  [id number]  Chief Complaint:  Subjective:  I'm doing so-so.  I am unable to find a job.  HPI: Patient came for her follow-up appointment.  She is stressed about not getting job.  She applied many places but so far she has no luck.  She is also complaining of back pain and shoulder pain.  Recently she's seen pain specialist and she supposed to get treatment next month.  Overall she described her depression is stable on her medication.  She denies any irritability, anger, paranoia or any hallucination.  She denies that once she get the job she is more relaxed.  She sleeping good with the Ambien.  She is also thinking to apply for disability but she wants to give one more chance and does not want to give up.  Her energy level is okay.  She admitted some nights not sleeping well because of shoulder and knee pain.  She denies any feeling of hopelessness or worthlessness.  She denies any crying spells or any suicidal thoughts.  She admitted at times getting frustrated but she is very hopeful.  Her relationship with her partner is very supportive and going very well.  Patient denies drinking alcohol or using any illegal substances.  Visit Diagnosis:    ICD-9-CM ICD-10-CM   1. 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 Patient has 1 hospitalization in 2010 1 her brother committed suicide and she was feeling very depressed and [facility]  She is taking antidepressants since 2008.  She had tried Prozac and Paxil.  She remember Prozac make her more manic and Paxil did not help.  She also tried Abilify which caused headaches and diarrhea.  Patient denies any history of suicidal  attempt, hallucination, psychosis or mania.  Past Medical History:  Past Medical History:  Diagnosis Date  . Anxiety   . Arthritis   . Deafness    Wears Bilat hearing aids since Childhood  . Depression   . Glaucoma   . Hyperlipidemia   . Memory changes   . Mild cognitive impairment, so stated 07/16/2013   Patient subjectively complains of word finding and memory deficits. MOCA 30-30 on 07-16-13 , paraphrasic errors- uses words such as  closet for garage   . Obese   . OSA (obstructive sleep apnea)    uses BIPAP  . 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 FOOT SURGERY    . REPLACEMENT TOTAL KNEE Bilateral 06/2006  . TOTAL KNEE ARTHROPLASTY Left 11/21/2013   Procedure: LEFT TOTAL KNEE  ARTHROPLASTY REVISION;  Surgeon: Loanne Drilling, MD;  Location: [facility];  Service: Orthopedics;  Laterality: Left;  . TOTAL VAGINAL HYSTERECTOMY  2007   COMPLETE    Family Psychiatric History: Reviewed.  Family History:  Family History  Problem Relation Age of Onset  . Depression Father   . Depression Paternal Grandmother   . Depression Mother   . Alcohol abuse Mother   . Depression Brother   . Alcohol abuse Brother     Social History:  Social History   Social History  .  Marital status: Married    Spouse name: N/A  . Number of children: 0  . Years of education: N/A   Occupational History  .  [employer]   Social History Main Topics  . Smoking status: Former Smoker    Quit date: 11/18/1988  . Smokeless tobacco: Never Used     Comment: Pt quit 17 yrs ago/smoked for 12 yrs  . Alcohol use 1.2 - 1.8 oz/week    2 - 3 Glasses of wine per week     Comment: social drinker/Caffeine 4-6 cups a day  . Drug use: No  . Sexual activity: Not Currently    Partners: Female   Other Topics Concern  . None   Social History Narrative   Works full-time @ [employer]    Has a cat,3 dogs    lives with Partner(Female)[name]     Allergies:  Allergies  Allergen Reactions  . Gramineae Pollens   . Statins     Metabolic Disorder Labs: Lab Results  Component Value Date   HGBA1C 5.8 (H) 07/16/2013   No results found for: PROLACTIN No results found for: CHOL, TRIG, HDL, CHOLHDL, VLDL, LDLCALC   Current Medications: Current Outpatient Prescriptions  Medication Sig Dispense Refill  . buPROPion (WELLBUTRIN XL) 150 MG 24 hr tablet Take 1 tablet (150 mg total) by mouth daily. 30 tablet 2  . HYDROcodone-acetaminophen (NORCO/VICODIN) 5-325 MG per tablet   0  . lamoTRIgine (LAMICTAL) 150 MG tablet Take 1 tablet (150 mg total) by mouth daily. 30 tablet 1  . Multiple Vitamins-Minerals (ALIVE WOMENS 50+ PO) Take by mouth.    . mupirocin nasal ointment (BACTROBAN) 2 % Place 1 application into the nose 2 (two) times daily. Use one-half of tube in each nostril twice daily for five (5) days. After application, press sides of nose together and gently massage.    Marland Kitchen. omega-3 acid ethyl esters (LOVAZA) 1 G capsule Take 1 g by mouth daily.    . timolol (TIMOPTIC) 0.5 % ophthalmic solution INT 1 GTT IN EACH EYE D IN THE MORNING  3  . travoprost, benzalkonium, (TRAVATAN) 0.004 % ophthalmic solution Place 1 drop into both eyes at bedtime.     Marland Kitchen. venlafaxine XR (EFFEXOR-XR) 150 MG 24 hr capsule TAKE 1 CAPSULE(150 MG) BY MOUTH DAILY WITH BREAKFAST 30 capsule 1  . zolpidem (AMBIEN) 5 MG tablet Take 1 tablet (5 mg total) by mouth at bedtime as needed for sleep. 15 tablet 2   No current facility-administered medications for this visit.     Neurologic: Headache: No Seizure: No Paresthesias: No  Musculoskeletal: Strength & Muscle Tone: decreased Gait & Station: normal Patient leans: N/A  Psychiatric Specialty Exam: Review of Systems  Constitutional: Negative.   HENT: Negative.        Hearing impairment  Musculoskeletal: Positive for back pain and joint pain.       Shoulder pain  Skin: Negative.   Psychiatric/Behavioral: The  patient is nervous/anxious.     Blood pressure 126/78, pulse 73, height 5' (1.524 m), weight 198 lb (89.8 kg).Body mass index is 38.67 kg/m.  General Appearance: Casual  Eye Contact:  Good  Speech:  Clear and Coherent and Normal Rate  Volume:  Normal  Mood:  Anxious  Affect:  Appropriate and Congruent  Thought Process:  Goal Directed  Orientation:  Full (Time, Place, and Person)  Thought Content: WDL and Logical   Suicidal Thoughts:  No  Homicidal Thoughts:  No  Memory:  Immediate;  Fair Recent;   Fair Remote;   Fair  Judgement:  Good  Insight:  Good  Psychomotor Activity:  EPS  Concentration:  Concentration: Fair and Attention Span: Fair  Recall:  Good  Fund of Knowledge: Good  Language: Good  Akathisia:  No  Handed:  Right  AIMS (if indicated):  0  Assets:  Communication Skills Desire for Improvement Housing Resilience  ADL's:  Intact  Cognition: WNL  Sleep:  fair   Assessment: Major depressive disorder, recurrent.  Plan: Reassurance given.  Patient does not want to change her medication.  Continue Effexor 150 mg daily, Wellbutrin and 50 mg daily, Lamictal 150 mg daily and Ambien 5 mg as needed for insomnia.  Discuss her psychosocial stressors encouraged not to give up and continue to apply multiple places.  Recommended to call us back if she has any question, concern or if she feels worsening of the symptom.  Patient has no tremors, shakes or any EPS.  She has no rash, itching or any headaches.  Follow-up in 3 months.     Cicely Ortner T., MD 09/26/2016, 2:24 PM

## 2016-12-25 ENCOUNTER — Encounter (HOSPITAL_COMMUNITY): Payer: Self-pay | Admitting: Psychiatry

## 2016-12-25 ENCOUNTER — Ambulatory Visit (INDEPENDENT_AMBULATORY_CARE_PROVIDER_SITE_OTHER): Payer: PRIVATE HEALTH INSURANCE | Admitting: Psychiatry

## 2016-12-25 DIAGNOSIS — Z818 Family history of other mental and behavioral disorders: Secondary | ICD-10-CM

## 2016-12-25 DIAGNOSIS — Z811 Family history of alcohol abuse and dependence: Secondary | ICD-10-CM

## 2016-12-25 DIAGNOSIS — F330 Major depressive disorder, recurrent, mild: Secondary | ICD-10-CM | POA: Diagnosis not present

## 2016-12-25 DIAGNOSIS — Z87891 Personal history of nicotine dependence: Secondary | ICD-10-CM | POA: Diagnosis not present

## 2016-12-25 MED ORDER — BUPROPION HCL ER (XL) 150 MG PO TB24: 150.0000 mg | ORAL_TABLET | Freq: Every day | ORAL | 2 refills | Status: DC

## 2016-12-25 MED ORDER — VENLAFAXINE HCL ER 150 MG PO CP24: ORAL_CAPSULE | ORAL | 2 refills | Status: DC

## 2016-12-25 MED ORDER — ZOLPIDEM TARTRATE 5 MG PO TABS: 5.0000 mg | ORAL_TABLET | Freq: Every evening | ORAL | 2 refills | Status: DC | PRN

## 2016-12-25 MED ORDER — LAMOTRIGINE 150 MG PO TABS: 150.0000 mg | ORAL_TABLET | Freq: Every day | ORAL | 2 refills | Status: DC

## 2016-12-25 NOTE — Progress Notes (Signed)
BH MD/PA/NP OP Progress Note  12/25/2016 1:35 PM ARRIONA PREST  MRN:  [id number]  Chief Complaint:  Subjective:  I am very happy.  I got a job.  HPI: Patient came for her follow-up appointment.  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  She is very happy because job is not stressful.  She has noticed since she got the job she sleeping better.  She is more relaxed, calm and yesterday she celebrated 14th wedding anniversary.  She is taking her medication and reported no side effects.  She takes Ambien 2 times a week.  She denies any crying spells or any feeling of hopelessness or worthlessness.  She recently seen PA in [facility] for chronic pain and received physical therapy.  Her pain is less intense and less frequent.  Patient denies any feeling of hopelessness or worthlessness.  She denies any paranoia or any hallucination.  She denies any suicidal thoughts.  She is happy that she lost weight and she has more energy and she is more social and active.  She lost 9 pounds in past 3 months.  Patient reported relationship with her wife is going very well.  Patient denies drinking alcohol or using any illegal substances.  She has no tremors, shakes or any EPS.  She has no rash or itching.  Visit Diagnosis:    ICD-9-CM ICD-10-CM   1. 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 Patient has hospitalization in 2010 when her brother committed suicide. She was feeling very depressed and [facility]. She has been taking antidepressants since 2008. She had tried Prozac and Paxil. She remember Prozac make her more manic and Paxil did not help. She also tried Abilify which caused  headaches and diarrhea. Patient denies any history of suicidal attempt, hallucination, psychosis or mania.  Past Medical History:  Past Medical History:  Diagnosis Date  . Anxiety   . Arthritis   . Deafness    Wears Bilat hearing aids since Childhood  . Depression   . Glaucoma   . Hyperlipidemia   . Memory changes   . Mild cognitive impairment, so stated 07/16/2013   Patient subjectively complains of word finding and memory deficits. MOCA 30-30 on 07-16-13 , paraphrasic errors- uses words such as  closet for garage   . Obese   . OSA (obstructive sleep apnea)    uses BIPAP  . 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 FOOT SURGERY    . REPLACEMENT TOTAL KNEE Bilateral 06/2006  . TOTAL KNEE ARTHROPLASTY Left 11/21/2013   Procedure: LEFT TOTAL KNEE  ARTHROPLASTY REVISION;  Surgeon: Loanne Drilling, MD;  Location: [facility];  Service: Orthopedics;  Laterality: Left;  . TOTAL VAGINAL HYSTERECTOMY  2007   COMPLETE    Family Psychiatric History: Reviewed.  Family History:  Family History  Problem Relation Age of Onset  . Depression Father   . Depression Paternal Grandmother   . Depression Mother   . Alcohol abuse Mother   . Depression Brother   . Alcohol abuse Brother     Social History:  Social History   Social History  . Marital status: Married    Spouse name: N/A  . Number of children: 0  . Years of education: N/A   Occupational History  .  [employer]   Social History Main Topics  . Smoking status: Former Smoker    Quit date: 11/18/1988  . Smokeless tobacco: Never Used     Comment: Pt quit 17 yrs ago/smoked for 12 yrs  . Alcohol use 1.2 - 1.8 oz/week    2 - 3 Glasses of wine per week     Comment: social drinker/Caffeine 4-6 cups a day  . Drug use: No  . Sexual activity: Not Currently    Partners: Female   Other Topics Concern  . Not on file   Social History Narrative   Works full-time  @ [employer]    Has a cat,3 dogs    lives with Partner(Female)[name]    Allergies:  Allergies  Allergen Reactions  . Gramineae Pollens   . Statins     Metabolic Disorder Labs: Lab Results  Component Value Date   HGBA1C 5.8 (H) 07/16/2013   No results found for: PROLACTIN No results found for: CHOL, TRIG, HDL, CHOLHDL, VLDL, LDLCALC   Current Medications: Current Outpatient Prescriptions  Medication Sig Dispense Refill  . buPROPion (WELLBUTRIN XL) 150 MG 24 hr tablet Take 1 tablet (150 mg total) by mouth daily. 30 tablet 2  . HYDROcodone-acetaminophen (NORCO/VICODIN) 5-325 MG per tablet   0  . lamoTRIgine (LAMICTAL) 150 MG tablet Take 1 tablet (150 mg total) by mouth daily. 30 tablet 2  . Multiple Vitamins-Minerals (ALIVE WOMENS 50+ PO) Take by mouth.    . mupirocin nasal ointment (BACTROBAN) 2 % Place 1 application into the nose 2 (two) times daily. Use one-half of tube in each nostril twice daily for five (5) days. After application, press sides of nose together and gently massage.    Marland Kitchen omega-3 acid ethyl esters (LOVAZA) 1 G capsule Take 1 g by mouth daily.    . timolol (TIMOPTIC) 0.5 % ophthalmic solution INT 1 GTT IN EACH EYE D IN THE MORNING  3  . travoprost, benzalkonium, (TRAVATAN) 0.004 % ophthalmic solution Place 1 drop into both eyes at bedtime.     Marland Kitchen venlafaxine XR (EFFEXOR-XR) 150 MG 24 hr capsule TAKE 1 CAPSULE(150 MG) BY MOUTH DAILY WITH BREAKFAST 30 capsule 2  . zolpidem (AMBIEN) 5 MG tablet Take 1 tablet (5 mg total) by mouth at bedtime as needed for sleep. 15 tablet 2   No current facility-administered medications for this visit.     Neurologic: Headache: No Seizure: No Paresthesias: No  Musculoskeletal: Strength & Muscle Tone: within normal limits Gait & Station: normal Patient leans: N/A  Psychiatric Specialty Exam: Review of Systems  Constitutional: Positive for weight loss. Negative for diaphoresis and malaise/fatigue.  HENT:       Hearing impairment   Musculoskeletal: Positive for back pain and joint pain.  Skin: Negative.  Negative for itching and rash.  Neurological: Negative for weakness.    Blood pressure 126/76, pulse 79, height 5\' 1"  (1.549 m), weight 190 lb (86.2 kg).Body mass index is 35.9 kg/m.  General Appearance: Casual  Eye Contact:  Good  Speech:  Clear and Coherent  Volume:  Normal  Mood:  Euthymic  Affect:  Congruent  Thought Process:  Goal Directed  Orientation:  Full (Time, Place, and Person)  Thought Content: WDL and Logical   Suicidal Thoughts:  No  Homicidal  Thoughts:  No  Memory:  Immediate;   Good Recent;   Good Remote;   Good  Judgement:  Good  Insight:  Good  Psychomotor Activity:  Normal  Concentration:  Concentration: Good and Attention Span: Good  Recall:  Good  Fund of Knowledge: Good  Language: Good  Akathisia:  No  Handed:  Right  AIMS (if indicated):  0  Assets:  Communication Skills Desire for Improvement Housing Resilience Social Support Transportation  ADL's:  Intact  Cognition: WNL  Sleep:  good    Assessment: Major depressive disorder, recurrent.  Plan: Patient is doing much better.  I will continue Effexor 150 mg daily, Wellbutrin XL 150 mg daily, Lamictal 150 mg daily and Ambien 5 mg as needed for insomnia.  Discussed medication side effects and benefits.  She has no tremors or shakes.  Recommended to call us back if she is any question, concern or if she feels worsening of the symptoms.  Follow-up in 3 months.  Juniper Cobey T., MD 12/25/2016, 1:35 PM

## 2017-01-24 ENCOUNTER — Other Ambulatory Visit (HOSPITAL_COMMUNITY): Payer: Self-pay | Admitting: Psychiatry

## 2017-01-24 DIAGNOSIS — F330 Major depressive disorder, recurrent, mild: Secondary | ICD-10-CM

## 2017-03-27 ENCOUNTER — Ambulatory Visit (HOSPITAL_COMMUNITY): Payer: Self-pay | Admitting: Psychiatry

## 2017-04-05 ENCOUNTER — Ambulatory Visit (HOSPITAL_COMMUNITY): Payer: PRIVATE HEALTH INSURANCE | Admitting: Psychiatry

## 2017-04-06 ENCOUNTER — Other Ambulatory Visit (HOSPITAL_COMMUNITY): Payer: Self-pay

## 2017-04-06 DIAGNOSIS — F330 Major depressive disorder, recurrent, mild: Secondary | ICD-10-CM

## 2017-04-06 MED ORDER — BUPROPION HCL ER (XL) 150 MG PO TB24: 150.0000 mg | ORAL_TABLET | Freq: Every day | ORAL | 0 refills | Status: DC

## 2017-04-06 MED ORDER — VENLAFAXINE HCL ER 150 MG PO CP24: ORAL_CAPSULE | ORAL | 0 refills | Status: DC

## 2017-04-06 MED ORDER — LAMOTRIGINE 150 MG PO TABS: 150.0000 mg | ORAL_TABLET | Freq: Every day | ORAL | 0 refills | Status: DC

## 2017-04-06 MED ORDER — ZOLPIDEM TARTRATE 5 MG PO TABS: 5.0000 mg | ORAL_TABLET | Freq: Every evening | ORAL | 0 refills | Status: DC | PRN

## 2017-04-10 ENCOUNTER — Ambulatory Visit (INDEPENDENT_AMBULATORY_CARE_PROVIDER_SITE_OTHER): Payer: PRIVATE HEALTH INSURANCE | Admitting: Psychiatry

## 2017-04-10 ENCOUNTER — Encounter (HOSPITAL_COMMUNITY): Payer: Self-pay | Admitting: Psychiatry

## 2017-04-10 DIAGNOSIS — Z87891 Personal history of nicotine dependence: Secondary | ICD-10-CM | POA: Diagnosis not present

## 2017-04-10 DIAGNOSIS — Z79899 Other long term (current) drug therapy: Secondary | ICD-10-CM

## 2017-04-10 DIAGNOSIS — Z560 Unemployment, unspecified: Secondary | ICD-10-CM

## 2017-04-10 DIAGNOSIS — Z818 Family history of other mental and behavioral disorders: Secondary | ICD-10-CM | POA: Diagnosis not present

## 2017-04-10 DIAGNOSIS — F330 Major depressive disorder, recurrent, mild: Secondary | ICD-10-CM

## 2017-04-10 DIAGNOSIS — Z811 Family history of alcohol abuse and dependence: Secondary | ICD-10-CM | POA: Diagnosis not present

## 2017-04-10 MED ORDER — BUPROPION HCL ER (XL) 150 MG PO TB24: 150.0000 mg | ORAL_TABLET | Freq: Every day | ORAL | 0 refills | Status: DC

## 2017-04-10 MED ORDER — LAMOTRIGINE 200 MG PO TABS: 200.0000 mg | ORAL_TABLET | Freq: Every day | ORAL | 0 refills | Status: DC

## 2017-04-10 MED ORDER — VENLAFAXINE HCL ER 150 MG PO CP24: ORAL_CAPSULE | ORAL | 0 refills | Status: DC

## 2017-04-10 NOTE — Progress Notes (Signed)
BH MD/PA/NP OP Progress Note  04/10/2017 4:15 PM [name] [name]  MRN:  [id number]  Chief Complaint:  I am very sad and depressed.  I lost my job after 4 weeks.  HPI: Patient came for her follow-up appointment.  She is very sad and disappointed.  She lost her job after 4 weeks.  She was told that company is not making enough money and let her go.  She admitted having issues with her partner and recently she threatened to leave her but she has no others place to go.  She admitted irritability and crying spells and some time poor sleep.  She admitted sadness and sometime feeling isolated and withdrawn but denies any suicidal thoughts.  She is taking her medication as prescribed.  She feels medicine working but due to financial problem her depression is getting worse.  She has not enough money to see a therapist.  She has joint pain.  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  She denies any hallucination, paranoia, self abusive behavior.  She mentioned house deed has both name and her partner cannot evict her but she need to find stable income.  She is applying everywhere.  Her energy level is fair.  Patient denies taking alcohol or using any illegal substances.  Visit Diagnosis:    ICD-10-CM   1. Major depressive disorder, recurrent episode, mild (HCC) F33.0 venlafaxine XR (EFFEXOR-XR) 150 MG 24 hr capsule    lamoTRIgine (LAMICTAL) 200 MG tablet    buPROPion (WELLBUTRIN XL) 150 MG 24 hr tablet    Past Psychiatric History: Reviewed. Patient has hospitalization in 2010 when her brother committed suicide. She was feeling very depressed and [facility]. She has been taking antidepressants since 2008. She had tried Prozac and Paxil. She remember Prozac make her more manic and Paxil did not help. She also tried Abilify which caused headaches and diarrhea. Patient denies any history of suicidal attempt, hallucination, psychosis or mania.  Past  Medical History:  Past Medical History:  Diagnosis Date  . Anxiety   . Arthritis   . Deafness    Wears Bilat hearing aids since Childhood  . Depression   . Glaucoma   . Hyperlipidemia   . Memory changes   . Mild cognitive impairment, so stated 07/16/2013   Patient subjectively complains of word finding and memory deficits. MOCA 30-30 on 07-16-13 , paraphrasic errors- uses words such as  closet for garage   . Obese   . OSA (obstructive sleep apnea)    uses BIPAP  . 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 FOOT SURGERY    . REPLACEMENT TOTAL KNEE Bilateral 06/2006  . TOTAL KNEE ARTHROPLASTY Left 11/21/2013   Procedure: LEFT TOTAL KNEE  ARTHROPLASTY REVISION;  Surgeon: Loanne Drilling, MD;  Location: [facility];  Service: Orthopedics;  Laterality: Left;  . TOTAL VAGINAL HYSTERECTOMY  2007   COMPLETE    Family Psychiatric History: Reviewed.  Family History:  Family History  Problem Relation Age of Onset  . Depression Father   . Depression Paternal Grandmother   . Depression Mother   . Alcohol abuse Mother   . Depression Brother   . Alcohol abuse Brother     Social History:  Social History   Social History  . Marital status: Married    Spouse name: N/A  . Number of children: 0  . Years  of education: N/A   Occupational History  .  [employer]   Social History Main Topics  . Smoking status: Former Smoker    Quit date: 11/18/1988  . Smokeless tobacco: Never Used     Comment: Pt quit 17 yrs ago/smoked for 12 yrs  . Alcohol use 1.2 - 1.8 oz/week    2 - 3 Glasses of wine per week     Comment: social drinker/Caffeine 4-6 cups a day  . Drug use: No  . Sexual activity: Yes    Partners: Female    Birth control/ protection: None   Other Topics Concern  . Not on file   Social History Narrative   Works full-time @ [employer]    Has a cat,3 dogs    lives with Partner(Female)[name]    Allergies:  Allergies   Allergen Reactions  . Gramineae Pollens   . Statins     Metabolic Disorder Labs: Lab Results  Component Value Date   HGBA1C 5.8 (H) 07/16/2013   No results found for: PROLACTIN No results found for: CHOL, TRIG, HDL, CHOLHDL, VLDL, LDLCALC Lab Results  Component Value Date   TSH 4.160 07/16/2013    Therapeutic Level Labs: No results found for: LITHIUM No results found for: VALPROATE No components found for:  CBMZ  Current Medications: Current Outpatient Prescriptions  Medication Sig Dispense Refill  . buPROPion (WELLBUTRIN XL) 150 MG 24 hr tablet Take 1 tablet (150 mg total) by mouth daily. 30 tablet 0  . HYDROcodone-acetaminophen (NORCO/VICODIN) 5-325 MG per tablet   0  . lamoTRIgine (LAMICTAL) 150 MG tablet Take 1 tablet (150 mg total) by mouth daily. 30 tablet 0  . Multiple Vitamins-Minerals (ALIVE WOMENS 50+ PO) Take by mouth.    . mupirocin nasal ointment (BACTROBAN) 2 % Place 1 application into the nose 2 (two) times daily. Use one-half of tube in each nostril twice daily for five (5) days. After application, press sides of nose together and gently massage.    Marland Kitchen. omega-3 acid ethyl esters (LOVAZA) 1 G capsule Take 1 g by mouth daily.    . timolol (TIMOPTIC) 0.5 % ophthalmic solution INT 1 GTT IN EACH EYE D IN THE MORNING  3  . travoprost, benzalkonium, (TRAVATAN) 0.004 % ophthalmic solution Place 1 drop into both eyes at bedtime.     Marland Kitchen. venlafaxine XR (EFFEXOR-XR) 150 MG 24 hr capsule TAKE 1 CAPSULE(150 MG) BY MOUTH DAILY WITH BREAKFAST 30 capsule 0  . zolpidem (AMBIEN) 5 MG tablet Take 1 tablet (5 mg total) by mouth at bedtime as needed for sleep. 15 tablet 0   No current facility-administered medications for this visit.      Musculoskeletal: Strength & Muscle Tone: within normal limits Gait & Station: normal Patient leans: N/A  Psychiatric Specialty Exam: Review of Systems  HENT: Positive for hearing loss.   Musculoskeletal: Positive for back pain and joint  pain.    Blood pressure 136/80, pulse 85, height 5' (1.524 m), weight 176 lb (79.8 kg).There is no height or weight on file to calculate BMI.  General Appearance: Casual  Eye Contact:  Good  Speech:  Slow  Volume:  Normal  Mood:  Anxious  Affect:  Appropriate  Thought Process:  Goal Directed  Orientation:  Full (Time, Place, and Person)  Thought Content: Logical and Rumination   Suicidal Thoughts:  No  Homicidal Thoughts:  No  Memory:  Immediate;   Good Recent;   Good Remote;   Good  Judgement:  Good  Insight:  Good  Psychomotor Activity:  Normal  Concentration:  Concentration: Fair and Attention Span: Fair  Recall:  Good  Fund of Knowledge: Good  Language: Good  Akathisia:  No  Handed:  Right  AIMS (if indicated): not done  Assets:  Communication Skills Desire for Improvement  ADL's:  Intact  Cognition: WNL  Sleep:  Fair   Screenings:   Assessment and Plan: Major depressive disorder, recurrent mild.  Reassurance given.  Recommended to try Lamictal 200 mg daily to help the mood lability.  She has no rash, itching, tremors or shakes.  Continue Wellbutrin XL 150 mg daily and Effexor 150 mg daily.  She takes Ambien only as needed.  Encourage to explore job options and try to spend time at [employer] and to fill job applications. She is also trying to improve her computer skills and getting free classes through [employer].  Discussed medication side effects and benefits.  Discuss safety concern that anytime having active suicidal thoughts or homicidal thoughts then she need to call 911 or go to the local emergency room.  Follow-up in 4 weeks.       Kamarian Sahakian T., MD 04/10/2017, 4:15 PM

## 2017-05-10 ENCOUNTER — Ambulatory Visit (HOSPITAL_COMMUNITY): Payer: Self-pay | Admitting: Psychiatry

## 2017-05-22 ENCOUNTER — Ambulatory Visit (INDEPENDENT_AMBULATORY_CARE_PROVIDER_SITE_OTHER): Payer: PRIVATE HEALTH INSURANCE | Admitting: Psychiatry

## 2017-05-22 ENCOUNTER — Encounter (HOSPITAL_COMMUNITY): Payer: Self-pay | Admitting: Psychiatry

## 2017-05-22 DIAGNOSIS — F330 Major depressive disorder, recurrent, mild: Secondary | ICD-10-CM

## 2017-05-22 DIAGNOSIS — Z87891 Personal history of nicotine dependence: Secondary | ICD-10-CM | POA: Diagnosis not present

## 2017-05-22 DIAGNOSIS — Z811 Family history of alcohol abuse and dependence: Secondary | ICD-10-CM | POA: Diagnosis not present

## 2017-05-22 DIAGNOSIS — Z818 Family history of other mental and behavioral disorders: Secondary | ICD-10-CM

## 2017-05-22 MED ORDER — LAMOTRIGINE 200 MG PO TABS: 200.0000 mg | ORAL_TABLET | Freq: Every day | ORAL | 0 refills | Status: DC

## 2017-05-22 MED ORDER — VENLAFAXINE HCL ER 150 MG PO CP24: ORAL_CAPSULE | ORAL | 0 refills | Status: DC

## 2017-05-22 MED ORDER — BUPROPION HCL ER (XL) 150 MG PO TB24: 150.0000 mg | ORAL_TABLET | Freq: Every day | ORAL | 0 refills | Status: DC

## 2017-05-22 NOTE — Progress Notes (Signed)
BH MD/PA/NP OP Progress Note  05/22/2017 10:36 AM [name] [name]  MRN:  [id number]  Chief Complaint:  I'm doing much better.  I got a part-time job.  HPI: Patient came for her follow-up appointment.  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  She also like increase Lamictal which is helping her mood and crying spells.  She started [facility] for her chronic pain which is helping her a lot.  She also lost a lot of weight because she is doing yoga and exercise.  Her relationship continues to get some time tense and is stressful but currently they have decided to take some time off and to rethink about their future.  Patient sleeping better.  She cannot afford therapy or counseling but she started reading books and trying to keep herself busy.  She denies any irritability, anger, mania, psychosis.  She denies any suicidal thoughts.  Her energy level is improved from the past.  Patient denies drinking alcohol or using any illegal substances.  She has no rash, itching or tremors.  Visit Diagnosis:    ICD-10-CM   1. Major depressive disorder, recurrent episode, mild (HCC) F33.0 venlafaxine XR (EFFEXOR-XR) 150 MG 24 hr capsule    lamoTRIgine (LAMICTAL) 200 MG tablet    buPROPion (WELLBUTRIN XL) 150 MG 24 hr tablet    Past Psychiatric History: Reviewed.   Patient has hospitalization in 2010 when her brother committed suicide. She was feeling very depressed and [facility] She has been taking antidepressants since 2008. She had tried Prozac and Paxil. She remember Prozac make her more manic and Paxil did not help. She also tried Abilify which caused headaches and diarrhea. Patient denies any history of suicidal attempt, hallucination, psychosis or mania.  Past Medical History:  Past Medical History:  Diagnosis Date  . Anxiety   . Arthritis   . Deafness    Wears Bilat hearing aids since Childhood  . Depression   . Glaucoma   .  Hyperlipidemia   . Memory changes   . Mild cognitive impairment, so stated 07/16/2013   Patient subjectively complains of word finding and memory deficits. MOCA 30-30 on 07-16-13 , paraphrasic errors- uses words such as  closet for garage   . Obese   . OSA (obstructive sleep apnea)    uses BIPAP  . 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 FOOT SURGERY    . REPLACEMENT TOTAL KNEE Bilateral 06/2006  . TOTAL KNEE ARTHROPLASTY Left 11/21/2013   Procedure: LEFT TOTAL KNEE  ARTHROPLASTY REVISION;  Surgeon: Loanne Drilling, MD;  Location: [facility];  Service: Orthopedics;  Laterality: Left;  . TOTAL VAGINAL HYSTERECTOMY  2007   COMPLETE    Family Psychiatric History: Reviewed.   Family History:  Family History  Problem Relation Age of Onset  . Depression Father   . Depression Paternal Grandmother   . Depression Mother   . Alcohol abuse Mother   . Depression Brother   . Alcohol abuse Brother     Social History:  Social History   Social History  . Marital status: Married    Spouse name: N/A  . Number of children: 0  . Years of education: N/A   Occupational History  .  [employer]   Social History Main Topics  . Smoking status: Former Smoker    Quit date: 11/18/1988  . Smokeless tobacco: Never Used  Comment: Pt quit 17 yrs ago/smoked for 12 yrs  . Alcohol use 1.2 - 1.8 oz/week    2 - 3 Glasses of wine per week     Comment: social drinker/Caffeine 4-6 cups a day  . Drug use: No  . Sexual activity: Yes    Partners: Female    Birth control/ protection: None   Other Topics Concern  . Not on file   Social History Narrative   Works full-time @ [employer]    Has a cat,3 dogs    lives with Partner(Female)[name]    Allergies:  Allergies  Allergen Reactions  . Gramineae Pollens   . Statins     Metabolic Disorder Labs: Lab Results  Component Value Date   HGBA1C 5.8 (H) 07/16/2013   No results found  for: PROLACTIN No results found for: CHOL, TRIG, HDL, CHOLHDL, VLDL, LDLCALC Lab Results  Component Value Date   TSH 4.160 07/16/2013    Therapeutic Level Labs: No results found for: LITHIUM No results found for: VALPROATE No components found for:  CBMZ  Current Medications: Current Outpatient Prescriptions  Medication Sig Dispense Refill  . buPROPion (WELLBUTRIN XL) 150 MG 24 hr tablet Take 1 tablet (150 mg total) by mouth daily. 90 tablet 0  . HYDROcodone-acetaminophen (NORCO/VICODIN) 5-325 MG per tablet   0  . lamoTRIgine (LAMICTAL) 200 MG tablet Take 1 tablet (200 mg total) by mouth daily. 90 tablet 0  . Multiple Vitamins-Minerals (ALIVE WOMENS 50+ PO) Take by mouth.    . mupirocin nasal ointment (BACTROBAN) 2 % Place 1 application into the nose 2 (two) times daily. Use one-half of tube in each nostril twice daily for five (5) days. After application, press sides of nose together and gently massage.    Marland Kitchen omega-3 acid ethyl esters (LOVAZA) 1 G capsule Take 1 g by mouth daily.    . timolol (TIMOPTIC) 0.5 % ophthalmic solution INT 1 GTT IN EACH EYE D IN THE MORNING  3  . travoprost, benzalkonium, (TRAVATAN) 0.004 % ophthalmic solution Place 1 drop into both eyes at bedtime.     Marland Kitchen venlafaxine XR (EFFEXOR-XR) 150 MG 24 hr capsule TAKE 1 CAPSULE(150 MG) BY MOUTH DAILY WITH BREAKFAST 90 capsule 0  . zolpidem (AMBIEN) 5 MG tablet Take 1 tablet (5 mg total) by mouth at bedtime as needed for sleep. 15 tablet 0   No current facility-administered medications for this visit.      Musculoskeletal: Strength & Muscle Tone: within normal limits Gait & Station: normal Patient leans: N/A  Psychiatric Specialty Exam: ROS  Blood pressure 124/82, pulse 70, height 5' (1.524 m), weight 182 lb 9.6 oz (82.8 kg).Body mass index is 35.66 kg/m.  General Appearance: Casual  Eye Contact:  Good  Speech:  Clear and Coherent  Volume:  Normal  Mood:  Anxious  Affect:  Appropriate  Thought Process:   Goal Directed  Orientation:  Full (Time, Place, and Person)  Thought Content: Logical and Rumination   Suicidal Thoughts:  No  Homicidal Thoughts:  No  Memory:  Immediate;   Good Recent;   Good Remote;   Good  Judgement:  Good  Insight:  Good  Psychomotor Activity:  Normal  Concentration:  Concentration: Fair and Attention Span: Fair  Recall:  Good  Fund of Knowledge: Good  Language: Good  Akathisia:  No  Handed:  Right  AIMS (if indicated): not done  Assets:  Communication Skills Desire for Improvement Housing Resilience  ADL's:  Intact  Cognition: WNL  Sleep:  Good   Screenings:   Assessment and Plan: Maj. depressive disorder, recurrent.  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  She also feeling better since Lamictal increase.  She has no itching tremors or shakes.  She has not taken Ambien in a while because she sleeping better.  I will continue Lamictal 200 mg daily, Wellbutrin XL 150 mg daily and Effexor and 50 mg daily.  Patient is trying to get full-time job and she is applying many places.  Reassurance given.  Recommended to call us back if she has any question or any concern.  Follow-up in 3 months.  Karey Suthers T., MD 05/22/2017, 10:36 AM

## 2017-06-26 ENCOUNTER — Telehealth (HOSPITAL_COMMUNITY): Payer: Self-pay

## 2017-06-26 DIAGNOSIS — F330 Major depressive disorder, recurrent, mild: Secondary | ICD-10-CM

## 2017-06-26 MED ORDER — ZOLPIDEM TARTRATE 5 MG PO TABS: 5.0000 mg | ORAL_TABLET | Freq: Every evening | ORAL | 0 refills | Status: DC | PRN

## 2017-06-26 NOTE — Telephone Encounter (Signed)
Medication management - Telephone message left for patient after speaking with Dr. Lolly MustacheArfeen who athorized a refill of patient's Ambien 5 mg, 1 at bedtime prn, #15 with no refills called into her [facility]. Called in order as verbally authorized by Dr. Lolly [facility] [address] in [place] with Tasia Catchingsraig, pharmacist.

## 2017-07-04 ENCOUNTER — Encounter (HOSPITAL_BASED_OUTPATIENT_CLINIC_OR_DEPARTMENT_OTHER): Payer: Self-pay

## 2017-07-04 ENCOUNTER — Emergency Department (HOSPITAL_BASED_OUTPATIENT_CLINIC_OR_DEPARTMENT_OTHER): Payer: PRIVATE HEALTH INSURANCE

## 2017-07-04 ENCOUNTER — Other Ambulatory Visit: Payer: Self-pay

## 2017-07-04 ENCOUNTER — Emergency Department (HOSPITAL_BASED_OUTPATIENT_CLINIC_OR_DEPARTMENT_OTHER)
Admission: EM | Admit: 2017-07-04 | Discharge: 2017-07-04 | Disposition: A | Discharge status: Home / Self Care | Payer: PRIVATE HEALTH INSURANCE | Attending: Emergency Medicine | Admitting: Emergency Medicine

## 2017-07-04 DIAGNOSIS — R079 Chest pain, unspecified: Secondary | ICD-10-CM | POA: Diagnosis not present

## 2017-07-04 DIAGNOSIS — Z87891 Personal history of nicotine dependence: Secondary | ICD-10-CM | POA: Diagnosis not present

## 2017-07-04 DIAGNOSIS — Z96652 Presence of left artificial knee joint: Secondary | ICD-10-CM | POA: Diagnosis not present

## 2017-07-04 DIAGNOSIS — Z96651 Presence of right artificial knee joint: Secondary | ICD-10-CM | POA: Diagnosis not present

## 2017-07-04 DIAGNOSIS — Z79899 Other long term (current) drug therapy: Secondary | ICD-10-CM | POA: Insufficient documentation

## 2017-07-04 DIAGNOSIS — R0981 Nasal congestion: Secondary | ICD-10-CM | POA: Diagnosis not present

## 2017-07-04 DIAGNOSIS — J209 Acute bronchitis, unspecified: Secondary | ICD-10-CM | POA: Diagnosis not present

## 2017-07-04 DIAGNOSIS — R05 Cough: Secondary | ICD-10-CM | POA: Diagnosis present

## 2017-07-04 LAB — BASIC METABOLIC PANEL
Anion gap: 8 (ref 5–15)
BUN: 8 mg/dL (ref 6–20)
CO2: 27 mmol/L (ref 22–32)
Calcium: 9.6 mg/dL (ref 8.9–10.3)
Chloride: 104 mmol/L (ref 101–111)
Creatinine, Ser: 0.64 mg/dL (ref 0.44–1.00)
GFR calc Af Amer: >60 mL/min (ref >60)
GLUCOSE: 100 mg/dL — AB (ref 65–99)
POTASSIUM: 3.8 mmol/L (ref 3.5–5.1)
Sodium: 139 mmol/L (ref 135–145)

## 2017-07-04 LAB — CBC WITH DIFFERENTIAL/PLATELET
BASOS ABS: 0 10*3/uL (ref 0.0–0.1)
Basophils Relative: 0 %
EOS PCT: 2 %
Eosinophils Absolute: 0.1 10*3/uL (ref 0.0–0.7)
HEMATOCRIT: 36.8 % (ref 36.0–46.0)
Hemoglobin: 12 g/dL (ref 12.0–15.0)
LYMPHS PCT: 18 %
Lymphs Abs: 1.4 10*3/uL (ref 0.7–4.0)
MCH: 32 pg (ref 26.0–34.0)
MCHC: 32.6 g/dL (ref 30.0–36.0)
MCV: 98.1 fL (ref 78.0–100.0)
MONO ABS: 0.7 10*3/uL (ref 0.1–1.0)
MONOS PCT: 9 %
NEUTROS ABS: 5.6 10*3/uL (ref 1.7–7.7)
Neutrophils Relative %: 71 %
PLATELETS: 271 10*3/uL (ref 150–400)
RBC: 3.75 MIL/uL — ABNORMAL LOW (ref 3.87–5.11)
RDW: 12 % (ref 11.5–15.5)
WBC: 7.8 10*3/uL (ref 4.0–10.5)

## 2017-07-04 LAB — TROPONIN I: Troponin I: <0.03 ng/mL (ref <0.03)

## 2017-07-04 MED ORDER — DEXTROSE 5 % IV SOLN
1.0000 g | Freq: Once | INTRAVENOUS | Status: AC
  Administered 2017-07-04: 1 g via INTRAVENOUS
  Filled 2017-07-04: qty 10

## 2017-07-04 MED ORDER — AZITHROMYCIN 250 MG PO TABS: ORAL_TABLET | ORAL | 0 refills | Status: DC

## 2017-07-04 MED ORDER — KETOROLAC TROMETHAMINE 30 MG/ML IJ SOLN
30.0000 mg | Freq: Once | INTRAMUSCULAR | Status: AC
  Administered 2017-07-04: 30 mg via INTRAVENOUS

## 2017-07-04 MED ORDER — AEROCHAMBER PLUS FLO-VU MEDIUM MISC
1.0000 | Freq: Once | Status: AC
  Administered 2017-07-04: 1
  Filled 2017-07-04: qty 1

## 2017-07-04 MED ORDER — AZITHROMYCIN 250 MG PO TABS
500.0000 mg | ORAL_TABLET | Freq: Once | ORAL | Status: AC
  Administered 2017-07-04: 500 mg via ORAL
  Filled 2017-07-04: qty 2

## 2017-07-04 MED ORDER — TRAMADOL HCL 50 MG PO TABS: 50.0000 mg | ORAL_TABLET | Freq: Four times a day (QID) | ORAL | 0 refills | Status: DC | PRN

## 2017-07-04 MED ORDER — IPRATROPIUM-ALBUTEROL 0.5-2.5 (3) MG/3ML IN SOLN
3.0000 mL | Freq: Once | RESPIRATORY_TRACT | Status: AC
  Administered 2017-07-04: 3 mL via RESPIRATORY_TRACT
  Filled 2017-07-04: qty 3

## 2017-07-04 MED ORDER — KETOROLAC TROMETHAMINE 30 MG/ML IJ SOLN
30.0000 mg | Freq: Once | INTRAMUSCULAR | Status: DC
  Filled 2017-07-04: qty 1

## 2017-07-04 MED ORDER — ALBUTEROL SULFATE HFA 108 (90 BASE) MCG/ACT IN AERS
1.0000 | INHALATION_SPRAY | RESPIRATORY_TRACT | Status: DC | PRN
  Administered 2017-07-04: 1 via RESPIRATORY_TRACT
  Filled 2017-07-04: qty 6.7

## 2017-07-04 NOTE — ED Triage Notes (Signed)
C/o flu like sx x 4 days-NAD-steady gait 

## 2017-07-04 NOTE — ED Provider Notes (Signed)
[facility] EMERGENCY DEPARTMENT Provider Note   CSN: [id number] Arrival date & time: 07/04/17  1116     History   Chief Complaint Chief Complaint  Patient presents with  . Cough    HPI [name] [name] is a 61 y.o. female.  Pt presents to the ED today with a cough that started on 11/26.  She said sx have progressively gotten worse.  She c/o sinus congestion and a productive cough with thick, green sputum.  The pt has not taken anything otc.  Pt also c/o pain in her upper chest.      Past Medical History:  Diagnosis Date  . Anxiety   . Arthritis   . Deafness    Wears Bilat hearing aids since Childhood  . Depression   . Glaucoma   . Hyperlipidemia   . Memory changes   . Mild cognitive impairment, so stated 07/16/2013   Patient subjectively complains of word finding and memory deficits. MOCA 30-30 on 07-16-13 , paraphrasic errors- uses words such as  closet for garage   . Obese   . OSA (obstructive sleep apnea)    uses BIPAP  . 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 Mild cognitive impairment, so stated 07/16/2013  . Depression with anxiety 07/16/2013  . Severe obesity (BMI >= 40) (HCC) 07/16/2013  . ANXIETY 10/04/2006  . DEPRESSIVE DISORDER, NOS 10/04/2006  . SINUSITIS, CHRONIC, NOS 10/04/2006  . OSTEOARTHRITIS, LOWER LEG 10/04/2006    Past Surgical History:  Procedure Laterality Date  . FOOT SURGERY    . REPLACEMENT TOTAL KNEE Bilateral 06/2006  . TOTAL KNEE ARTHROPLASTY Left 11/21/2013   Procedure: LEFT TOTAL KNEE  ARTHROPLASTY REVISION;  Surgeon: Loanne DrillingFrank V Aluisio, MD;  Location: [facility];  Service: Orthopedics;  Laterality: Left;  . TOTAL VAGINAL HYSTERECTOMY  2007   COMPLETE    OB History    No data available       Home Medications    Prior to Admission medications   Medication Sig Start Date End Date Taking?  Authorizing Provider  azithromycin (ZITHROMAX) 250 MG tablet Take 1 every day until finished. 07/04/17   Jacalyn LefevreHaviland, Samanthamarie Ezzell, MD  buPROPion (WELLBUTRIN XL) 150 MG 24 hr tablet Take 1 tablet (150 mg total) by mouth daily. 05/22/17   Cleotis NipperArfeen, Syed T, MD  HYDROcodone-acetaminophen (NORCO/VICODIN) 5-325 MG per tablet  12/10/14   [provider]  lamoTRIgine (LAMICTAL) 200 MG tablet Take 1 tablet (200 mg total) by mouth daily. 05/22/17   Arfeen, Phillips GroutSyed T, MD  Multiple Vitamins-Minerals (ALIVE WOMENS 50+ PO) Take by mouth.    [provider]  mupirocin nasal ointment (BACTROBAN) 2 % Place 1 application into the nose 2 (two) times daily. Use one-half of tube in each nostril twice daily for five (5) days. After application, press sides of nose together and gently massage.    [provider]  omega-3 acid ethyl esters (LOVAZA) 1 G capsule Take 1 g by mouth daily.    [provider]  timolol (TIMOPTIC) 0.5 % ophthalmic solution INT 1 GTT IN [facility] IN THE MORNING 06/21/15   [provider]  traMADol (ULTRAM) 50 MG tablet Take 1 tablet (50 mg total) by mouth every 6 (six) hours as needed. 07/04/17   Jacalyn LefevreHaviland, Angelene Rome, MD  travoprost, benzalkonium, (TRAVATAN) 0.004 % ophthalmic solution Place 1 drop into both  eyes at bedtime.     [provider]  venlafaxine XR (EFFEXOR-XR) 150 MG 24 hr capsule TAKE 1 CAPSULE(150 MG) BY MOUTH DAILY WITH BREAKFAST 05/22/17   Arfeen, Phillips Grout, MD  zolpidem (AMBIEN) 5 MG tablet Take 1 tablet (5 mg total) by mouth at bedtime as needed for sleep. 06/26/17   Cleotis Nipper, MD    Family History Family History  Problem Relation Age of Onset  . Depression Father   . Depression Paternal Grandmother   . Depression Mother   . Alcohol abuse Mother   . Depression Brother   . Alcohol abuse Brother     Social History Social History   Tobacco Use  . Smoking status: Former Smoker    Last attempt to quit: 11/18/1988    Years since  quitting: 28.6  . Smokeless tobacco: Never Used  . Tobacco comment: Pt quit 17 yrs ago/smoked for 12 yrs  Substance Use Topics  . Alcohol use: Yes    Comment: occ  . Drug use: No     Allergies   Gramineae pollens and Statins   Review of Systems Review of Systems  Constitutional: Positive for fever.  HENT: Positive for congestion.   Respiratory: Positive for cough, shortness of breath and wheezing.   Cardiovascular: Positive for chest pain.  All other systems reviewed and are negative.    Physical Exam Updated Vital Signs BP (!) 134/114   Pulse 60   Temp 98.4 F (36.9 C) (Oral)   Resp 16   Ht 5' (1.524 m)   Wt 81.8 kg (180 lb 5.4 oz)   SpO2 98%   BMI 35.22 kg/m   Physical Exam  Constitutional: She is oriented to person, place, and time. She appears well-developed and well-nourished.  HENT:  Head: Normocephalic and atraumatic.  Right Ear: External ear normal.  Left Ear: External ear normal.  Nose: Rhinorrhea present. Right sinus exhibits maxillary sinus tenderness and frontal sinus tenderness. Left sinus exhibits maxillary sinus tenderness and frontal sinus tenderness.  Eyes: Conjunctivae and EOM are normal. Pupils are equal, round, and reactive to light.  Neck: Normal range of motion. Neck supple.  Cardiovascular: Normal rate, regular rhythm, normal heart sounds and intact distal pulses.  Pulmonary/Chest: She has wheezes.  Abdominal: Soft. Bowel sounds are normal.  Musculoskeletal: Normal range of motion.  Neurological: She is alert and oriented to person, place, and time.  Skin: Skin is warm and dry. Capillary refill takes less than 2 seconds.  Psychiatric: She has a normal mood and affect. Her behavior is normal. Judgment and thought content normal.  Nursing note and vitals reviewed.    ED Treatments / Results  Labs (all labs ordered are listed, but only abnormal results are displayed) Labs Reviewed  BASIC METABOLIC PANEL - Abnormal; Notable for the  following components:      Result Value   Glucose, Bld 100 (*)    All other components within normal limits  CBC WITH DIFFERENTIAL/PLATELET - Abnormal; Notable for the following components:   RBC 3.75 (*)    All other components within normal limits  TROPONIN I    EKG  EKG Interpretation  Date/Time:  Wednesday July 04 2017 12:27:09 EST Ventricular Rate:  58 PR Interval:    QRS Duration: 91 QT Interval:  398 QTC Calculation: 391 R Axis:   46 Text Interpretation:  Sinus rhythm Confirmed by Jacalyn Lefevre [phone]) on 07/04/2017 12:31:51 PM       Radiology Dg Chest 2 View  Result Date: 07/04/2017 CLINICAL DATA:  Productive cough, fever, chest pain. EXAM: CHEST  2 VIEW COMPARISON:  Radiographs of June 05, 2006. FINDINGS: The heart size and mediastinal contours are within normal limits. Both lungs are clear. No pneumothorax or pleural effusion is noted. The visualized skeletal structures are unremarkable. IMPRESSION: No active cardiopulmonary disease. Electronically Signed   By: Lupita RaiderJames  Green Jr, M.D.   On: 07/04/2017 11:57    Procedures Procedures (including critical care time)  Medications Ordered in ED Medications  albuterol (PROVENTIL HFA;VENTOLIN HFA) 108 (90 Base) MCG/ACT inhaler 1-2 puff (1 puff Inhalation Given 07/04/17 1238)  ipratropium-albuterol (DUONEB) 0.5-2.5 (3) MG/3ML nebulizer solution 3 mL (3 mLs Nebulization Given 07/04/17 1150)  cefTRIAXone (ROCEPHIN) 1 g in dextrose 5 % 50 mL IVPB (1 g Intravenous New Bag/Given 07/04/17 1220)  azithromycin (ZITHROMAX) tablet 500 mg (500 mg Oral Given 07/04/17 1220)  AEROCHAMBER PLUS FLO-VU MEDIUM MISC 1 each (1 each Other Given 07/04/17 1238)  ketorolac (TORADOL) 30 MG/ML injection 30 mg (30 mg Intravenous Given 07/04/17 1220)     Initial Impression / Assessment and Plan / ED Course  I have reviewed the triage vital signs and the nursing notes.  Pertinent labs & imaging results that were available during my care  of the patient were reviewed by me and considered in my medical decision making (see chart for details).    Pt is feeling much better.  She is stable for d/c.  She knows to f/u with pcp and to return if worse.  Final Clinical Impressions(s) / ED Diagnoses   Final diagnoses:  Acute bronchitis, unspecified organism    ED Discharge Orders        Ordered    azithromycin (ZITHROMAX) 250 MG tablet     07/04/17 1319    traMADol (ULTRAM) 50 MG tablet  Every 6 hours PRN     07/04/17 1319       Jacalyn LefevreHaviland, Maximillian Habibi, MD 07/04/17 1329

## 2017-07-09 ENCOUNTER — Other Ambulatory Visit: Payer: Self-pay | Admitting: Chiropractic Medicine

## 2017-07-09 ENCOUNTER — Ambulatory Visit
Admission: RE | Admit: 2017-07-09 | Discharge: 2017-07-09 | Disposition: A | Discharge status: Home / Self Care | Payer: PRIVATE HEALTH INSURANCE | Source: Ambulatory Visit | Attending: Chiropractic Medicine | Admitting: Chiropractic Medicine

## 2017-07-09 DIAGNOSIS — G8929 Other chronic pain: Secondary | ICD-10-CM

## 2017-07-13 ENCOUNTER — Other Ambulatory Visit (HOSPITAL_COMMUNITY): Payer: Self-pay

## 2017-07-13 DIAGNOSIS — F330 Major depressive disorder, recurrent, mild: Secondary | ICD-10-CM

## 2017-07-13 MED ORDER — ZOLPIDEM TARTRATE 5 MG PO TABS: 5.0000 mg | ORAL_TABLET | Freq: Every evening | ORAL | 0 refills | Status: DC | PRN

## 2017-07-23 ENCOUNTER — Encounter (HOSPITAL_COMMUNITY): Payer: Self-pay | Admitting: Psychiatry

## 2017-07-23 ENCOUNTER — Ambulatory Visit (INDEPENDENT_AMBULATORY_CARE_PROVIDER_SITE_OTHER): Payer: PRIVATE HEALTH INSURANCE | Admitting: Psychiatry

## 2017-07-23 DIAGNOSIS — G8929 Other chronic pain: Secondary | ICD-10-CM | POA: Diagnosis not present

## 2017-07-23 DIAGNOSIS — Z811 Family history of alcohol abuse and dependence: Secondary | ICD-10-CM

## 2017-07-23 DIAGNOSIS — F419 Anxiety disorder, unspecified: Secondary | ICD-10-CM | POA: Diagnosis not present

## 2017-07-23 DIAGNOSIS — R262 Difficulty in walking, not elsewhere classified: Secondary | ICD-10-CM

## 2017-07-23 DIAGNOSIS — F330 Major depressive disorder, recurrent, mild: Secondary | ICD-10-CM

## 2017-07-23 DIAGNOSIS — G4700 Insomnia, unspecified: Secondary | ICD-10-CM

## 2017-07-23 DIAGNOSIS — M2550 Pain in unspecified joint: Secondary | ICD-10-CM

## 2017-07-23 DIAGNOSIS — Z87891 Personal history of nicotine dependence: Secondary | ICD-10-CM

## 2017-07-23 DIAGNOSIS — Z560 Unemployment, unspecified: Secondary | ICD-10-CM

## 2017-07-23 DIAGNOSIS — Z818 Family history of other mental and behavioral disorders: Secondary | ICD-10-CM | POA: Diagnosis not present

## 2017-07-23 MED ORDER — VENLAFAXINE HCL ER 150 MG PO CP24: ORAL_CAPSULE | ORAL | 0 refills | Status: DC

## 2017-07-23 MED ORDER — ZOLPIDEM TARTRATE 5 MG PO TABS: 5.0000 mg | ORAL_TABLET | Freq: Every evening | ORAL | 1 refills | Status: DC | PRN

## 2017-07-23 MED ORDER — BUPROPION HCL ER (XL) 150 MG PO TB24: 150.0000 mg | ORAL_TABLET | Freq: Every day | ORAL | 0 refills | Status: DC

## 2017-07-23 MED ORDER — LAMOTRIGINE 200 MG PO TABS: 200.0000 mg | ORAL_TABLET | Freq: Every day | ORAL | 0 refills | Status: DC

## 2017-07-23 NOTE — Progress Notes (Signed)
BH MD/PA/NP OP Progress Note  07/23/2017 10:32 AM [name] [name]  MRN:  [id number]  Chief Complaint: I applied for disability.  I have a lot of pain and I cannot walk without the help of stick.  HPI: Patient came for her follow-up appointment.  She is sad and depressed because she lost her part-time job.  Now she had apply for disability because of chronic pain.  She has difficulty walking.  She continues to struggle with finances.  Now she is separated from [place] her partner and she ruminates about the relationship.  She like to go back but she cannot stand the behavior of her partner.  Patient told few times her partner contact her but usually visit ends up with her attitude.  She is relieved that her nephew helping her and she is currently staying with him.  She tried to stay with her parents but her mother has advanced stage of dementia and her father is concerned that if patient stays with them it would cause more disrupt atmosphere at home.  Patient also admitted because she has noticed whenever she go and visit the father, her mother started yelling and cursing.  Patient recently met with physician who recommended to see Dr. Reynaldo Minium and she may need surgery.  Patient also met with the lawyer and she applied for disability.  Patient admitted crying spells, sometimes feeling hopelessness but denies any active suicidal thoughts, paranoia or any hallucination.  Her appetite is okay.  She is hoping her disability improves to help her financial problems.  Patient denies drinking alcohol or using any illegal substances.  Initially she requested Ambien because she has difficulty sleeping but now she feels Ambien helping her sleep.  Visit Diagnosis:    ICD-10-CM   1. Major depressive disorder, recurrent episode, mild (HCC) F33.0 lamoTRIgine (LAMICTAL) 200 MG tablet    venlafaxine XR (EFFEXOR-XR) 150 MG 24 hr capsule    buPROPion (WELLBUTRIN XL) 150 MG 24 hr tablet    zolpidem (AMBIEN) 5 MG tablet    Past  Psychiatric History: Reviewed. Patient has hospitalization in 2010 when her brother committed suicide. She was feeling very depressed and admitted at behavioral [facility]. She has been taking antidepressants since 2008. She had tried Prozac and Paxil. She remember Prozac make her more manic and Paxil did not help. She also tried Abilify which caused headaches and diarrhea. Patient denies any history of suicidal attempt, hallucination, psychosis or mania.  Past Medical History:  Past Medical History:  Diagnosis Date  . Anxiety   . Arthritis   . Deafness    Wears Bilat hearing aids since Childhood  . Depression   . Glaucoma   . Hyperlipidemia   . Memory changes   . Mild cognitive impairment, so stated 07/16/2013   Patient subjectively complains of word finding and memory deficits. MOCA 30-30 on 07-16-13 , paraphrasic errors- uses words such as  closet for garage   . Obese   . OSA (obstructive sleep apnea)    uses BIPAP  . 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 FOOT SURGERY    . REPLACEMENT TOTAL KNEE Bilateral 06/2006  . TOTAL KNEE ARTHROPLASTY Left 11/21/2013   Procedure: LEFT TOTAL KNEE  ARTHROPLASTY REVISION;  Surgeon: Gearlean Alf, MD;  Location: [facility];  Service: Orthopedics;  Laterality: Left;  . TOTAL VAGINAL HYSTERECTOMY  2007   COMPLETE    Family Psychiatric History: Reviewed.  Family  History:  Family History  Problem Relation Age of Onset  . Depression Father   . Depression Paternal Grandmother   . Depression Mother   . Alcohol abuse Mother   . Depression Brother   . Alcohol abuse Brother     Social History:  Social History   Socioeconomic History  . Marital status: Married    Spouse name: None  . Number of children: 0  . Years of education: None  . Highest education level: None  Social Needs  . Financial resource strain: None  . Food insecurity - worry: None  . Food insecurity  - inability: None  . Transportation needs - medical: None  . Transportation needs - non-medical: None  Occupational History    Employer: [employer]  Tobacco Use  . Smoking status: Former Smoker    Last attempt to quit: 11/18/1988    Years since quitting: 28.6  . Smokeless tobacco: Never Used  . Tobacco comment: Pt quit 17 yrs ago/smoked for 12 yrs  Substance and Sexual Activity  . Alcohol use: Yes    Comment: occ  . Drug use: No  . Sexual activity: Yes    Partners: Female    Birth control/protection: None  Other Topics Concern  . None  Social History Narrative   Works full-time @ [employer]    Has a cat,3 dogs    lives with Partner(Female)[name]    Allergies:  Allergies  Allergen Reactions  . Gramineae Pollens   . Statins     Metabolic Disorder Labs: Lab Results  Component Value Date   HGBA1C 5.8 (H) 07/16/2013   No results found for: PROLACTIN No results found for: CHOL, TRIG, HDL, CHOLHDL, VLDL, LDLCALC Lab Results  Component Value Date   TSH 4.160 07/16/2013    Therapeutic Level Labs: No results found for: LITHIUM No results found for: VALPROATE No components found for:  CBMZ  Current Medications: Current Outpatient Medications  Medication Sig Dispense Refill  . azithromycin (ZITHROMAX) 250 MG tablet Take 1 every day until finished. 4 tablet 0  . buPROPion (WELLBUTRIN XL) 150 MG 24 hr tablet Take 1 tablet (150 mg total) by mouth daily. 90 tablet 0  . HYDROcodone-acetaminophen (NORCO/VICODIN) 5-325 MG per tablet   0  . lamoTRIgine (LAMICTAL) 200 MG tablet Take 1 tablet (200 mg total) by mouth daily. 90 tablet 0  . Multiple Vitamins-Minerals (ALIVE WOMENS 50+ PO) Take by mouth.    . mupirocin nasal ointment (BACTROBAN) 2 % Place 1 application into the nose 2 (two) times daily. Use one-half of tube in each nostril twice daily for five (5) days. After application, press sides of nose together and gently massage.    Marland Kitchen omega-3 acid ethyl esters (LOVAZA) 1 G  capsule Take 1 g by mouth daily.    . timolol (TIMOPTIC) 0.5 % ophthalmic solution INT 1 GTT IN EACH EYE D IN THE MORNING  3  . traMADol (ULTRAM) 50 MG tablet Take 1 tablet (50 mg total) by mouth every 6 (six) hours as needed. 15 tablet 0  . travoprost, benzalkonium, (TRAVATAN) 0.004 % ophthalmic solution Place 1 drop into both eyes at bedtime.     Marland Kitchen venlafaxine XR (EFFEXOR-XR) 150 MG 24 hr capsule TAKE 1 CAPSULE(150 MG) BY MOUTH DAILY WITH BREAKFAST 90 capsule 0  . zolpidem (AMBIEN) 5 MG tablet Take 1 tablet (5 mg total) by mouth at bedtime as needed for sleep. 15 tablet 0   No current facility-administered medications for this visit.  Musculoskeletal: Strength & Muscle Tone: within normal limits Gait & Station: difficulty walking due to pain Patient leans: Front  Psychiatric Specialty Exam: Review of Systems  Musculoskeletal: Positive for back pain and joint pain.  Skin: Negative.  Negative for itching and rash.  Psychiatric/Behavioral: Positive for depression.    Blood pressure 130/80, pulse 82, height 5' (1.524 m), weight 174 lb 6.4 oz (79.1 kg).Body mass index is 34.06 kg/m.  General Appearance: Casual  Eye Contact:  Good  Speech:  Clear and Coherent  Volume:  Normal  Mood:  Dysphoric  Affect:  Congruent  Thought Process:  Goal Directed  Orientation:  Full (Time, Place, and Person)  Thought Content: Rumination   Suicidal Thoughts:  No  Homicidal Thoughts:  No  Memory:  Immediate;   Good Recent;   Good Remote;   Good  Judgement:  Good  Insight:  Good  Psychomotor Activity:  Decreased  Concentration:  Concentration: Fair and Attention Span: Fair  Recall:  Good  Fund of Knowledge: Good  Language: Good  Akathisia:  No  Handed:  Right  AIMS (if indicated): not done  Assets:  Communication Skills Desire for Improvement  ADL's:  Intact  Cognition: WNL  Sleep:  Good   Screenings:   Assessment and Plan: Major depressive disorder, recurrent.  Anxiety disorder  NOS.  Reassurance given.  Discussed psychosocial stressors.  Patient cannot afford counseling but recently her partner [name] requested to have couple therapy and I suggested that she should see a couple therapist with [name].  Discussed medication side effects and benefits.  Continue current medication which she believed working.  She has no rash or itching.  Continue Lamictal 200 mg daily, Wellbutrin XL 150 mg daily, Effexor 150 mg daily and Ambien 5 mg as needed for insomnia.  Discussed medication side effects and benefits.  Recommended to call us back if she has any question or any concern.  Follow-up in 2 months.   Kathlee Nations, MD 07/23/2017, 10:33 AM

## 2017-08-22 ENCOUNTER — Other Ambulatory Visit (HOSPITAL_COMMUNITY): Payer: Self-pay | Admitting: Psychiatry

## 2017-08-22 DIAGNOSIS — F330 Major depressive disorder, recurrent, mild: Secondary | ICD-10-CM

## 2017-08-24 ENCOUNTER — Other Ambulatory Visit (HOSPITAL_COMMUNITY): Payer: Self-pay | Admitting: Psychiatry

## 2017-08-24 NOTE — Telephone Encounter (Signed)
Patient was given a new prescription on December 17 with one more refill.  Too soon to give a new prescription.

## 2017-09-13 ENCOUNTER — Ambulatory Visit: Payer: Self-pay | Admitting: Orthopedic Surgery

## 2017-09-13 NOTE — Progress Notes (Signed)
Please place orders in Epic as patient is being scheduled for a pre-op  Appointment! Thank you! 

## 2017-09-18 ENCOUNTER — Encounter (HOSPITAL_COMMUNITY): Payer: Self-pay

## 2017-09-19 ENCOUNTER — Encounter (HOSPITAL_COMMUNITY): Payer: Self-pay

## 2017-09-19 ENCOUNTER — Ambulatory Visit: Payer: Self-pay | Admitting: Orthopedic Surgery

## 2017-09-19 ENCOUNTER — Other Ambulatory Visit: Payer: Self-pay

## 2017-09-19 ENCOUNTER — Encounter (HOSPITAL_COMMUNITY)
Admission: RE | Admit: 2017-09-19 | Discharge: 2017-09-19 | Disposition: A | Discharge status: Home / Self Care | Payer: PRIVATE HEALTH INSURANCE | Source: Ambulatory Visit | Attending: Orthopedic Surgery | Admitting: Orthopedic Surgery

## 2017-09-19 DIAGNOSIS — M1612 Unilateral primary osteoarthritis, left hip: Secondary | ICD-10-CM | POA: Diagnosis not present

## 2017-09-19 DIAGNOSIS — Z01818 Encounter for other preprocedural examination: Secondary | ICD-10-CM | POA: Diagnosis not present

## 2017-09-19 HISTORY — DX: Unspecified osteoarthritis, unspecified site: M19.90

## 2017-09-19 HISTORY — DX: Presence of external hearing-aid: Z97.4

## 2017-09-19 HISTORY — DX: Obstructive sleep apnea (adult) (pediatric): G47.33

## 2017-09-19 HISTORY — DX: Presence of spectacles and contact lenses: Z97.3

## 2017-09-19 HISTORY — DX: Other symptoms and signs involving the musculoskeletal system: R29.898

## 2017-09-19 HISTORY — DX: Other allergic rhinitis: J30.89

## 2017-09-19 HISTORY — DX: Unspecified glaucoma: H40.9

## 2017-09-19 LAB — COMPREHENSIVE METABOLIC PANEL
ALT: 15 U/L (ref 14–54)
AST: 25 U/L (ref 15–41)
Albumin: 4 g/dL (ref 3.5–5.0)
Alkaline Phosphatase: 62 U/L (ref 38–126)
Anion gap: 10 (ref 5–15)
BUN: 12 mg/dL (ref 6–20)
CO2: 28 mmol/L (ref 22–32)
Calcium: 9.7 mg/dL (ref 8.9–10.3)
Chloride: 103 mmol/L (ref 101–111)
Creatinine, Ser: 0.63 mg/dL (ref 0.44–1.00)
GFR calc Af Amer: >60 mL/min (ref >60)
GFR calc non Af Amer: >60 mL/min (ref >60)
Glucose, Bld: 102 mg/dL — ABNORMAL HIGH (ref 65–99)
Potassium: 4.8 mmol/L (ref 3.5–5.1)
Sodium: 141 mmol/L (ref 135–145)
Total Bilirubin: 0.5 mg/dL (ref 0.3–1.2)
Total Protein: 7.7 g/dL (ref 6.5–8.1)

## 2017-09-19 LAB — CBC
HEMATOCRIT: 36.6 % (ref 36.0–46.0)
HEMOGLOBIN: 11.9 g/dL — AB (ref 12.0–15.0)
MCH: 31.9 pg (ref 26.0–34.0)
MCHC: 32.5 g/dL (ref 30.0–36.0)
MCV: 98.1 fL (ref 78.0–100.0)
Platelets: 247 10*3/uL (ref 150–400)
RBC: 3.73 MIL/uL — AB (ref 3.87–5.11)
RDW: 12.1 % (ref 11.5–15.5)
WBC: 6 10*3/uL (ref 4.0–10.5)

## 2017-09-19 LAB — PROTIME-INR
INR: 0.93
PROTHROMBIN TIME: 12.3 s (ref 11.4–15.2)

## 2017-09-19 LAB — SURGICAL PCR SCREEN
MRSA, PCR: NEGATIVE
Staphylococcus aureus: POSITIVE — AB

## 2017-09-19 LAB — APTT: aPTT: 28 seconds (ref 24–36)

## 2017-09-19 NOTE — Patient Instructions (Addendum)
[name] [name]  09/19/2017   Your procedure is scheduled on:  09-24-2017  Report to [facility] Main  Entrance  Report to admitting at   12:15 PM   Call this number if you have problems the morning of surgery [phone]   Remember: Do not eat food  :After Midnight. With exception clear liquids until 8:45 AM, then nothing by mouth including water, candy, gum, or mints     Take these medicines the morning of surgery with A SIP OF WATER:   Wellbutrin, Lamictal, Effexor, Claritin if needed, Eye drops as usual                               You may not have any metal on your body including hair pins and              piercings  Do not wear jewelry, make-up, lotions, powders or perfumes, deodorant             Do not wear nail polish.  Do not shave  48 hours prior to surgery.                 Do not bring valuables to the hospital. [facility] IS NOT             RESPONSIBLE   FOR VALUABLES.  Contacts, dentures or bridgework may not be worn into surgery.  Leave suitcase in the car. After surgery it may be brought to your room.      Special Instructions: Bring BiPap mask and tubing              Please read over the following fact sheets you were given: _____________________________________________________________________                CLEAR LIQUID DIET   Foods Allowed                                                                     Foods Excluded  Coffee and tea, regular and decaf                             liquids that you cannot  Plain Jell-O in any flavor                                             see through such as: Fruit ices (not with fruit pulp)                                     milk, soups, orange juice  Iced Popsicles                                    All solid food Carbonated beverages, regular and diet  Cranberry, grape and apple juices Sports drinks like Gatorade Lightly seasoned clear broth or  consume(fat free) Sugar, honey syrup  Sample Menu Breakfast                                Lunch                                     Supper Cranberry juice                    Beef broth                            Chicken broth Jell-O                                     Grape juice                           Apple juice Coffee or tea                        Jell-O                                      Popsicle                                                Coffee or tea                        Coffee or tea  _____________________________________________________________________    Incentive Spirometer  An incentive spirometer is a tool that can help keep your lungs clear and active. This tool measures how well you are filling your lungs with each breath. Taking long deep breaths may help reverse or decrease the chance of developing breathing (pulmonary) problems (especially infection) following:  A long period of time when you are unable to move or be active. BEFORE THE PROCEDURE   If the spirometer includes an indicator to show your best effort, your nurse or respiratory therapist will set it to a desired goal.  If possible, sit up straight or lean slightly forward. Try not to slouch.  Hold the incentive spirometer in an upright position. INSTRUCTIONS FOR USE  1. Sit on the edge of your bed if possible, or sit up as far as you can in bed or on a chair. 2. Hold the incentive spirometer in an upright position. 3. Breathe out normally. 4. Place the mouthpiece in your mouth and seal your lips tightly around it. 5. Breathe in slowly and as deeply as possible, raising the piston or the ball toward the top of the column. 6. Hold your breath for 3-5 seconds or for as long as possible. Allow the piston or ball to fall to the bottom of the column. 7. Remove the mouthpiece from your mouth and breathe out normally. 8. Rest for a few seconds and repeat Steps 1 through 7 at  least 10 times every 1-2  hours when you are awake. Take your time and take a few normal breaths between deep breaths. 9. The spirometer may include an indicator to show your best effort. Use the indicator as a goal to work toward during each repetition. 10. After each set of 10 deep breaths, practice coughing to be sure your lungs are clear. If you have an incision (the cut made at the time of surgery), support your incision when coughing by placing a pillow or rolled up towels firmly against it. Once you are able to get out of bed, walk around indoors and cough well. You may stop using the incentive spirometer when instructed by your caregiver.  RISKS AND COMPLICATIONS  Take your time so you do not get dizzy or light-headed.  If you are in pain, you may need to take or ask for pain medication before doing incentive spirometry. It is harder to take a deep breath if you are having pain. AFTER USE  Rest and breathe slowly and easily.  It can be helpful to keep track of a log of your progress. Your caregiver can provide you with a simple table to help with this. If you are using the spirometer at home, follow these instructions: SEEK MEDICAL CARE IF:   You are having difficultly using the spirometer.  You have trouble using the spirometer as often as instructed.  Your pain medication is not giving enough relief while using the spirometer.  You develop fever of 100.5 F (38.1 C) or higher. SEEK IMMEDIATE MEDICAL CARE IF:   You cough up bloody sputum that had not been present before.  You develop fever of 102 F (38.9 C) or greater.  You develop worsening pain at or near the incision site. MAKE SURE YOU:   Understand these instructions.  Will watch your condition.  Will get help right away if you are not doing well or get worse. Document Released: 12/04/2006 Document Revised: 10/16/2011 Document Reviewed: 02/04/2007 ExitCare Patient Information 2014 ExitCare,  [place].   ________________________________________________________________________  WHAT IS A BLOOD TRANSFUSION? Blood Transfusion Information  A transfusion is the replacement of blood or some of its parts. Blood is made up of multiple cells which provide different functions.  Red blood cells carry oxygen and are used for blood loss replacement.  White blood cells fight against infection.  Platelets control bleeding.  Plasma helps clot blood.  Other blood products are available for specialized needs, such as hemophilia or other clotting disorders. BEFORE THE TRANSFUSION  Who gives blood for transfusions?   Healthy volunteers who are fully evaluated to make sure their blood is safe. This is blood bank blood. Transfusion therapy is the safest it has ever been in the practice of medicine. Before blood is taken from a donor, a complete history is taken to make sure that person has no history of diseases nor engages in risky social behavior (examples are intravenous drug use or sexual activity with multiple partners). The donor's travel history is screened to minimize risk of transmitting infections, such as malaria. The donated blood is tested for signs of infectious diseases, such as HIV and hepatitis. The blood is then tested to be sure it is compatible with you in order to minimize the chance of a transfusion reaction. If you or a relative donates blood, this is often done in anticipation of surgery and is not appropriate for emergency situations. It takes many days to process the donated blood. RISKS AND COMPLICATIONS Although transfusion  therapy is very safe and saves many lives, the main dangers of transfusion include:   Getting an infectious disease.  Developing a transfusion reaction. This is an allergic reaction to something in the blood you were given. Every precaution is taken to prevent this. The decision to have a blood transfusion has been considered carefully by your caregiver  before blood is given. Blood is not given unless the benefits outweigh the risks. AFTER THE TRANSFUSION  Right after receiving a blood transfusion, you will usually feel much better and more energetic. This is especially true if your red blood cells have gotten low (anemic). The transfusion raises the level of the red blood cells which carry oxygen, and this usually causes an energy increase.  The nurse administering the transfusion will monitor you carefully for complications. HOME CARE INSTRUCTIONS  No special instructions are needed after a transfusion. You may find your energy is better. Speak with your caregiver about any limitations on activity for underlying diseases you may have. SEEK MEDICAL CARE IF:   Your condition is not improving after your transfusion.  You develop redness or irritation at the intravenous (IV) site. SEEK IMMEDIATE MEDICAL CARE IF:  Any of the following symptoms occur over the next 12 hours:  Shaking chills.  You have a temperature by mouth above 102 F (38.9 C), not controlled by medicine.  Chest, back, or muscle pain.  People around you feel you are not acting correctly or are confused.  Shortness of breath or difficulty breathing.  Dizziness and fainting.  You get a rash or develop hives.  You have a decrease in urine output.  Your urine turns a dark color or changes to pink, red, or brown. Any of the following symptoms occur over the next 10 days:  You have a temperature by mouth above 102 F (38.9 C), not controlled by medicine.  Shortness of breath.  Weakness after normal activity.  The white part of the eye turns yellow (jaundice).  You have a decrease in the amount of urine or are urinating less often.  Your urine turns a dark color or changes to pink, red, or brown. Document Released: 07/21/2000 Document Revised: 10/16/2011 Document Reviewed: 03/09/2008 ExitCare Patient Information 2014 [place].  _______________________________________________________________________[facility] - Preparing for Surgery  Before surgery, you can play an important role.  Because skin is not sterile, your skin needs to be as free of germs as possible.  You can reduce the number of germs on you skin by washing with CHG (chlorahexidine gluconate) soap before surgery.  CHG is an antiseptic cleaner which kills germs and bonds with the skin to continue killing germs even after washing.  Please DO NOT use if you have an allergy to CHG or antibacterial soaps.  If your skin becomes reddened/irritated stop using the CHG and inform your nurse when you arrive at Short Stay.  Do not shave (including legs and underarms) for at least 48 hours prior to the first CHG shower.  You may shave your face.  Please follow these instructions carefully:   1.  Shower with CHG Soap the night before surgery and the                                morning of Surgery.  2.  If you choose to wash your hair, wash your hair first as usual with your       normal shampoo.  3.  After you shampoo, rinse your hair and body thoroughly to remove the                      Shampoo.  4.  Use CHG as you would any other liquid soap.  You can apply chg directly       to the skin and wash gently with scrungie or a clean washcloth.  5.  Apply the CHG Soap to your body ONLY FROM THE NECK DOWN.        Do not use on open wounds or open sores.  Avoid contact with your eyes,       ears, mouth and genitals (private parts).  Wash genitals (private parts)       with your normal soap.  6.  Wash thoroughly, paying special attention to the area where your surgery        will be performed.  7.  Thoroughly rinse your body with warm water from the neck down.  8.  DO NOT shower/wash with your normal soap after using and rinsing off       the CHG Soap.  9.  Pat yourself dry with a clean towel.            10.  Wear clean pajamas.            11.  Place clean sheets on  your bed the night of your first shower and do not        sleep with pets.  Day of Surgery  Do not apply any lotions/deoderants the morning of surgery.  Please wear clean clothes to the hospital/surgery center.

## 2017-09-19 NOTE — H&P (View-Only) (Signed)
Name  [name] [name], [name] [name] [phone], F)    DOB  12-28-1955              Chief Complaint  Left Hip Pain  H&P left THA on 09/24/2017 at [facility]     Patient's Pharmacies  [facility]): [address], [place] [address], Ph [phone], Fax [phone]   Vitals   Ht:  5 ft 09/18/2017 04:10 pm  Pain Scale:  10 09/18/2017 04:14 pm     BP - 132/76 Pulse - 84   Allergies   Reviewed Allergies   STATINS-HMG-COA REDUCTASE INHIBITORS       Medications   Reviewed Medications       BUPROPION HCL XL 150 MG TB24  08/26/17 filled  [facility]   diclofenac 1 % topical gel  APP 4 GRAMS EXT AA Q 6 H PRN  06/13/17 filled  surescripts   HYDROcodone 10 mg-acetaminophen 325 mg tablet  TAKE ONE TABLET BY MOUTH EVERY 8 HOURS PRN FOR UP TO 30 DAYS  09/05/17 filled  surescripts   LAMOTRIGINE 200 MG TABS  08/26/17 filled  [facility]   timolol maleate 0.5 % eye drops  INT 1 GTT IN [facility] EYE D IN THE MORNING  08/14/17 filled  surescripts   Travatan Z 0.004 % eye drops  INT 1 GTT IN [facility] QHS  07/24/17 filled  surescripts   venlafaxine ER 150 mg capsule,extended release 24 hr  TK 1 C PO DAILY WITH BREAKFAST  07/27/17 filled  surescripts   zolpidem 5 mg tablet  TK 1 T PO QD HS  09/03/17 filled  surescripts     Family History  Reviewed Family History Paternal Grandfather  - Heart disease    - Congestive heart failure   Mother  - Osteoarthritis    - Diabetes mellitus   Maternal Grandmother  - Osteoarthritis    - Chronic obstructive lung disease   Father  - Diabetes mellitus     Social History  Reviewed Social History Smoking Status: Former smoker Tobacco-years of use: 15 Alcohol intake: Occasional Hand Dominance: Right Are on Hormone Therapy?: N Caffeine: Y Advance directive: Y Medical Power of Attorney: Y Live alone or with others?: with others    Surgical History  Reviewed Surgical History Hysterectomy - 09/2005   Procedure on foot - Rigth Foot   bil total hip arthroplasty  bil total knee arthroplasty  lt. total knee revision    Past Medical History  Reviewed Past Medical History High Cholesterol: Y Notes: Impaired Memory,  Impaired Vision,  Heart Murmur,  Anxiety,  Depression  .    HPI   The patient comes in for her H&P left THA on 09/24/2017 at [facility].  [name] has had developed some severe pain in her LEFT hip over the past few months. She has had pain for about a year but has become severe in the past 2 or 3 months. It is hurting at all times including day and night. It is unrelenting and she cannot get comfortable. She had recent x-rays and was told she had arthritis or avascular necrosis. She is here today for evaluation. She is developing discomfort in the LEFT knee also. She does have pain in her RIGHT hip and RIGHT knee but to a lesser extent than the LEFT.  Radiographs, AP and lateral of both knees show both prostheses in excellent position with no periprosthetic abnormalities. I reviewed the hip x-rays that she  had elsewhere and she has severe end-stage arthritis of the LEFT worse than RIGHT hip. She is bone-on-bone with some erosion of the superolateral femoral head. The LEFT side has the femoral head erosion. The RIGHT side bone-on-bone without erosion.  The patient has significant pain and dysfunction in their hip. They have significant functional limitations and have failed nonoperative management. At this point the most predictable means of improving pain and function is total hip arthroplasty. We discussed the procedure risks, potential complication and rehab course in detail. At this time the patient would like to go ahead and proceed with the total hip arthroplasty. Risks and benefits have been discussed with the patient and she elects to proceed with surgery at this time.    ROS   Constitutional: Constitutional: no fever, chills, night sweats, or significant weight  loss.  Cardiovascular: Cardiovascular: no palpitations or chest pain.  Respiratory: Respiratory: no cough or shortness of breath and No COPD.  Gastrointestinal: Gastrointestinal: no vomiting or nausea.  Musculoskeletal: Musculoskeletal: no swelling in Joints and Joint Pain.  Neurologic: Neurologic: no numbness, tingling, or difficulty with balance.    Physical Exam   Patient is a 62 year old female.  General Mental Status - Alert, cooperative and good historian. General Appearance - pleasant, Not in acute distress. Orientation - Oriented X3. Build & Nutrition - Well nourished and Well developed. Head and Neck Head - normocephalic, atraumatic . Neck Global Assessment - supple, no bruit auscultated on the right, no bruit auscultated on the left. Eye Pupil - Bilateral - PERR  Motion - Bilateral - EOMI. Chest and Lung Exam Auscultation Breath sounds - clear at anterior chest wall and clear at posterior chest wall. Adventitious sounds - No Adventitious sounds. Cardiovascular Auscultation Rhythm - Regular rate and rhythm. Heart Sounds - S1 WNL and S2 WNL. Murmurs & Other Heart Sounds - Auscultation of the heart reveals - No Murmurs. Abdomen Palpation/Percussion Tenderness - Abdomen is non-tender to palpation. Abdomen is soft. Auscultation Auscultation of the abdomen reveals - Bowel sounds normal. Genitourinary Note: Not done, not pertinent to present illness Musculoskeletal RIGHT hip can be flexed to 100 with minimal internal rotation about 10 of external rotation 10 of abduction. LEFT hip can be flexed to 80 with no internal or external rotation and no abduction. She is not ambulating today. Both knees show no swelling. Range is about 0-125. There is no instability about either knee in no specific tenderness about either knee.   Radiographs AP and lateral of both knees show both prostheses in excellent position with no periprosthetic abnormalities. I reviewed the hip x-rays that  she had elsewhere and she has severe end-stage arthritis of the LEFT worse than RIGHT hip. She is bone-on-bone with some erosion of the superolateral femoral head. The LEFT side has the femoral head erosion. The RIGHT side bone-on-bone without erosion.     Assessment / Plan   1. Osteoarthritis of left hip joint  M16.12: Unilateral primary osteoarthritis, left hip  Patient Instructions  Surgical Plans: Left Total Hip Replacement - Anterior Approach Disposition: Home PCP: Dr. Everlene OtherBouska IV TXA Anesthesia Issues: None Patient was instructed on what medications to stop prior to surgery. - Follow up visit in 2 weeks with Dr. Lequita HaltAluisio - Begin physical therapy following surgery - Pre-operative lab work as pre Pre-Surgical Testing - Prescriptions will be provided in hospital at time of discharge  Return to Office Ollen GrossFrank Aluisio, MD [facility] on 10/09/2017 at 04:45 PM    Encounter signed-off  by Patrica Duel, PA-C

## 2017-09-19 NOTE — H&P (Signed)
Name  [name], [name] (61yo, F)    DOB  10/25/1955              Chief Complaint  Left Hip Pain  H&P left THA on 09/24/2017 at [facility]     Patient's Pharmacies  [id number] ([facility]): [address], [place] [address], Ph [phone], Fax [phone]   Vitals   Ht:  5 ft 09/18/2017 04:10 pm  Pain Scale:  10 09/18/2017 04:14 pm     BP - 132/76 Pulse - 84   Allergies   Reviewed Allergies   STATINS-HMG-COA REDUCTASE INHIBITORS       Medications   Reviewed Medications       BUPROPION HCL XL 150 MG TB24  08/26/17 filled  [facility]   diclofenac 1 % topical gel  APP 4 GRAMS EXT AA Q 6 H PRN  06/13/17 filled  surescripts   HYDROcodone 10 mg-acetaminophen 325 mg tablet  TAKE ONE TABLET BY MOUTH EVERY 8 HOURS PRN FOR UP TO 30 DAYS  09/05/17 filled  surescripts   LAMOTRIGINE 200 MG TABS  08/26/17 filled  [facility]   timolol maleate 0.5 % eye drops  INT 1 GTT IN EACH EYE D IN THE MORNING  08/14/17 filled  surescripts   Travatan Z 0.004 % eye drops  INT 1 GTT IN OU QHS  07/24/17 filled  surescripts   venlafaxine ER 150 mg capsule,extended release 24 hr  TK 1 C PO DAILY WITH BREAKFAST  07/27/17 filled  surescripts   zolpidem 5 mg tablet  TK 1 T PO QD HS  09/03/17 filled  surescripts     Family History  Reviewed Family History Paternal Grandfather  - Heart disease    - Congestive heart failure   Mother  - Osteoarthritis    - Diabetes mellitus   Maternal Grandmother  - Osteoarthritis    - Chronic obstructive lung disease   Father  - Diabetes mellitus     Social History  Reviewed Social History Smoking Status: Former smoker Tobacco-years of use: 15 Alcohol intake: Occasional Hand Dominance: Right Are on Hormone Therapy?: N Caffeine: Y Advance directive: Y Medical Power of Attorney: Y Live alone or with others?: with others    Surgical History  Reviewed Surgical History Hysterectomy - 09/2005   Procedure on foot - Rigth Foot   bil total hip arthroplasty  bil total knee arthroplasty  lt. total knee revision    Past Medical History  Reviewed Past Medical History High Cholesterol: Y Notes: Impaired Memory,  Impaired Vision,  Heart Murmur,  Anxiety,  Depression  .    HPI   The patient comes in for her H&P left THA on 09/24/2017 at [facility].  [name] [name] has had developed some severe pain in her LEFT hip over the past few months. She has had pain for about a year but has become severe in the past 2 or 3 months. It is hurting at all times including day and night. It is unrelenting and she cannot get comfortable. She had recent x-rays and was told she had arthritis or avascular necrosis. She is here today for evaluation. She is developing discomfort in the LEFT knee also. She does have pain in her RIGHT hip and RIGHT knee but to a lesser extent than the LEFT.  Radiographs, AP and lateral of both knees show both prostheses in excellent position with no periprosthetic abnormalities. I reviewed the hip x-rays that she   had elsewhere and she has severe end-stage arthritis of the LEFT worse than RIGHT hip. She is bone-on-bone with some erosion of the superolateral femoral head. The LEFT side has the femoral head erosion. The RIGHT side bone-on-bone without erosion.  The patient has significant pain and dysfunction in their hip. They have significant functional limitations and have failed nonoperative management. At this point the most predictable means of improving pain and function is total hip arthroplasty. We discussed the procedure risks, potential complication and rehab course in detail. At this time the patient would like to go ahead and proceed with the total hip arthroplasty. Risks and benefits have been discussed with the patient and she elects to proceed with surgery at this time.    ROS   Constitutional: Constitutional: no fever, chills, night sweats, or significant weight  loss.  Cardiovascular: Cardiovascular: no palpitations or chest pain.  Respiratory: Respiratory: no cough or shortness of breath and No COPD.  Gastrointestinal: Gastrointestinal: no vomiting or nausea.  Musculoskeletal: Musculoskeletal: no swelling in Joints and Joint Pain.  Neurologic: Neurologic: no numbness, tingling, or difficulty with balance.    Physical Exam   Patient is a 61-year-old female.  General Mental Status - Alert, cooperative and good historian. General Appearance - pleasant, Not in acute distress. Orientation - Oriented X3. Build & Nutrition - Well nourished and Well developed. Head and Neck Head - normocephalic, atraumatic . Neck Global Assessment - supple, no bruit auscultated on the right, no bruit auscultated on the left. Eye Pupil - Bilateral - PERR  Motion - Bilateral - EOMI. Chest and Lung Exam Auscultation Breath sounds - clear at anterior chest wall and clear at posterior chest wall. Adventitious sounds - No Adventitious sounds. Cardiovascular Auscultation Rhythm - Regular rate and rhythm. Heart Sounds - S1 WNL and S2 WNL. Murmurs & Other Heart Sounds - Auscultation of the heart reveals - No Murmurs. Abdomen Palpation/Percussion Tenderness - Abdomen is non-tender to palpation. Abdomen is soft. Auscultation Auscultation of the abdomen reveals - Bowel sounds normal. Genitourinary Note: Not done, not pertinent to present illness Musculoskeletal RIGHT hip can be flexed to 100 with minimal internal rotation about 10 of external rotation 10 of abduction. LEFT hip can be flexed to 80 with no internal or external rotation and no abduction. She is not ambulating today. Both knees show no swelling. Range is about 0-125. There is no instability about either knee in no specific tenderness about either knee.   Radiographs AP and lateral of both knees show both prostheses in excellent position with no periprosthetic abnormalities. I reviewed the hip x-rays that  she had elsewhere and she has severe end-stage arthritis of the LEFT worse than RIGHT hip. She is bone-on-bone with some erosion of the superolateral femoral head. The LEFT side has the femoral head erosion. The RIGHT side bone-on-bone without erosion.     Assessment / Plan   1. Osteoarthritis of left hip joint  M16.12: Unilateral primary osteoarthritis, left hip  Patient Instructions  Surgical Plans: Left Total Hip Replacement - Anterior Approach Disposition: Home PCP: Dr. Bouska IV TXA Anesthesia Issues: None Patient was instructed on what medications to stop prior to surgery. - Follow up visit in 2 weeks with Dr. Aluisio - Begin physical therapy following surgery - Pre-operative lab work as pre Pre-Surgical Testing - Prescriptions will be provided in hospital at time of discharge  Return to Office Frank Aluisio, MD for Post-Op at [facility] on 10/09/2017 at 04:45 PM    Encounter signed-off   by Dillyn Menna, PA-C   

## 2017-09-20 NOTE — Progress Notes (Signed)
Pt surgery start time has changed to 1345 from previously at 1450.  LVMM via pt phone  about time change and told to arrive at [facility] dos 09-24-2017 instead of time told at PAT appt 09-19-2017.

## 2017-09-23 MED ORDER — TRANEXAMIC ACID 1000 MG/10ML IV SOLN
1000.0000 mg | INTRAVENOUS | Status: AC
  Administered 2017-09-24: 1000 mg via INTRAVENOUS
  Filled 2017-09-23: qty 1100

## 2017-09-24 ENCOUNTER — Inpatient Hospital Stay (HOSPITAL_COMMUNITY): Payer: PRIVATE HEALTH INSURANCE

## 2017-09-24 ENCOUNTER — Inpatient Hospital Stay (HOSPITAL_COMMUNITY): Payer: PRIVATE HEALTH INSURANCE | Admitting: Certified Registered Nurse Anesthetist

## 2017-09-24 ENCOUNTER — Inpatient Hospital Stay (HOSPITAL_COMMUNITY)
Admission: RE | Admit: 2017-09-24 | Discharge: 2017-09-26 | DRG: 470 | Disposition: A | Discharge status: Home Health | Payer: PRIVATE HEALTH INSURANCE | Source: Ambulatory Visit | Attending: Orthopedic Surgery | Admitting: Orthopedic Surgery

## 2017-09-24 ENCOUNTER — Encounter (HOSPITAL_COMMUNITY): Payer: Self-pay | Admitting: *Deleted

## 2017-09-24 ENCOUNTER — Encounter (HOSPITAL_COMMUNITY)
Admission: RE | Disposition: A | Discharge status: Home / Self Care | Payer: Self-pay | Source: Ambulatory Visit | Attending: Orthopedic Surgery

## 2017-09-24 ENCOUNTER — Other Ambulatory Visit: Payer: Self-pay

## 2017-09-24 DIAGNOSIS — Z79899 Other long term (current) drug therapy: Secondary | ICD-10-CM

## 2017-09-24 DIAGNOSIS — Z87891 Personal history of nicotine dependence: Secondary | ICD-10-CM

## 2017-09-24 DIAGNOSIS — Z96653 Presence of artificial knee joint, bilateral: Secondary | ICD-10-CM | POA: Diagnosis present

## 2017-09-24 DIAGNOSIS — M1612 Unilateral primary osteoarthritis, left hip: Principal | ICD-10-CM | POA: Diagnosis present

## 2017-09-24 DIAGNOSIS — M169 Osteoarthritis of hip, unspecified: Secondary | ICD-10-CM | POA: Diagnosis present

## 2017-09-24 DIAGNOSIS — H547 Unspecified visual loss: Secondary | ICD-10-CM | POA: Diagnosis present

## 2017-09-24 DIAGNOSIS — G4733 Obstructive sleep apnea (adult) (pediatric): Secondary | ICD-10-CM | POA: Diagnosis present

## 2017-09-24 DIAGNOSIS — Z888 Allergy status to other drugs, medicaments and biological substances status: Secondary | ICD-10-CM | POA: Diagnosis not present

## 2017-09-24 DIAGNOSIS — Z96649 Presence of unspecified artificial hip joint: Secondary | ICD-10-CM

## 2017-09-24 DIAGNOSIS — H9190 Unspecified hearing loss, unspecified ear: Secondary | ICD-10-CM | POA: Diagnosis present

## 2017-09-24 DIAGNOSIS — Z974 Presence of external hearing-aid: Secondary | ICD-10-CM | POA: Diagnosis not present

## 2017-09-24 DIAGNOSIS — H409 Unspecified glaucoma: Secondary | ICD-10-CM | POA: Diagnosis present

## 2017-09-24 DIAGNOSIS — G4730 Sleep apnea, unspecified: Secondary | ICD-10-CM | POA: Diagnosis present

## 2017-09-24 DIAGNOSIS — F329 Major depressive disorder, single episode, unspecified: Secondary | ICD-10-CM | POA: Diagnosis present

## 2017-09-24 DIAGNOSIS — Z90710 Acquired absence of both cervix and uterus: Secondary | ICD-10-CM | POA: Diagnosis not present

## 2017-09-24 HISTORY — PX: TOTAL HIP ARTHROPLASTY: SHX124

## 2017-09-24 LAB — TYPE AND SCREEN
ABO/RH(D): A POS
Antibody Screen: NEGATIVE

## 2017-09-24 SURGERY — ARTHROPLASTY, HIP, TOTAL, ANTERIOR APPROACH
Anesthesia: Monitor Anesthesia Care | Site: Hip | Laterality: Left

## 2017-09-24 MED ORDER — BUPROPION HCL ER (XL) 150 MG PO TB24
150.0000 mg | ORAL_TABLET | Freq: Every morning | ORAL | Status: DC
  Administered 2017-09-25 – 2017-09-26 (×2): 150 mg via ORAL
  Filled 2017-09-24 (×2): qty 1

## 2017-09-24 MED ORDER — PHENOL 1.4 % MT LIQD: 1.0000 | OROMUCOSAL | Status: DC | PRN

## 2017-09-24 MED ORDER — SODIUM CHLORIDE 0.9 % IV SOLN
INTRAVENOUS | Status: DC
  Administered 2017-09-24: 20:00:00 via INTRAVENOUS

## 2017-09-24 MED ORDER — BUPIVACAINE IN DEXTROSE 0.75-8.25 % IT SOLN
INTRATHECAL | Status: DC | PRN
  Administered 2017-09-24: 1.5 mL via INTRATHECAL

## 2017-09-24 MED ORDER — OXYCODONE HCL 5 MG PO TABS
10.0000 mg | ORAL_TABLET | ORAL | Status: DC | PRN
  Administered 2017-09-24 – 2017-09-26 (×12): 10 mg via ORAL
  Filled 2017-09-24 (×11): qty 2

## 2017-09-24 MED ORDER — BUPIVACAINE HCL (PF) 0.25 % IJ SOLN
INTRAMUSCULAR | Status: AC
  Filled 2017-09-24: qty 30

## 2017-09-24 MED ORDER — BISACODYL 10 MG RE SUPP: 10.0000 mg | Freq: Every day | RECTAL | Status: DC | PRN

## 2017-09-24 MED ORDER — ONDANSETRON HCL 4 MG/2ML IJ SOLN
INTRAMUSCULAR | Status: DC | PRN
  Administered 2017-09-24: 4 mg via INTRAVENOUS

## 2017-09-24 MED ORDER — SODIUM CHLORIDE 0.9 % IR SOLN
Status: DC | PRN
  Administered 2017-09-24: 1000 mL

## 2017-09-24 MED ORDER — OXYCODONE HCL 5 MG PO TABS
ORAL_TABLET | ORAL | Status: AC
  Filled 2017-09-24: qty 2

## 2017-09-24 MED ORDER — METOCLOPRAMIDE HCL 5 MG PO TABS: 5.0000 mg | ORAL_TABLET | Freq: Three times a day (TID) | ORAL | Status: DC | PRN

## 2017-09-24 MED ORDER — METOCLOPRAMIDE HCL 5 MG/ML IJ SOLN: 5.0000 mg | Freq: Three times a day (TID) | INTRAMUSCULAR | Status: DC | PRN

## 2017-09-24 MED ORDER — TRANEXAMIC ACID 1000 MG/10ML IV SOLN
1000.0000 mg | Freq: Once | INTRAVENOUS | Status: DC
  Filled 2017-09-24: qty 10

## 2017-09-24 MED ORDER — LORATADINE 10 MG PO TABS: 10.0000 mg | ORAL_TABLET | Freq: Every day | ORAL | Status: DC | PRN

## 2017-09-24 MED ORDER — ONDANSETRON HCL 4 MG PO TABS: 4.0000 mg | ORAL_TABLET | Freq: Four times a day (QID) | ORAL | Status: DC | PRN

## 2017-09-24 MED ORDER — DEXAMETHASONE SODIUM PHOSPHATE 10 MG/ML IJ SOLN
10.0000 mg | Freq: Once | INTRAMUSCULAR | Status: AC
  Administered 2017-09-25: 10 mg via INTRAVENOUS
  Filled 2017-09-24: qty 1

## 2017-09-24 MED ORDER — FENTANYL CITRATE (PF) 100 MCG/2ML IJ SOLN
INTRAMUSCULAR | Status: AC
  Filled 2017-09-24: qty 2

## 2017-09-24 MED ORDER — ACETAMINOPHEN 650 MG RE SUPP: 650.0000 mg | RECTAL | Status: DC | PRN

## 2017-09-24 MED ORDER — ACETAMINOPHEN 500 MG PO TABS
1000.0000 mg | ORAL_TABLET | Freq: Four times a day (QID) | ORAL | Status: AC
Start: 2017-09-24 — End: 2017-09-25
  Administered 2017-09-24 – 2017-09-25 (×4): 1000 mg via ORAL
  Filled 2017-09-24 (×4): qty 2

## 2017-09-24 MED ORDER — FENTANYL CITRATE (PF) 100 MCG/2ML IJ SOLN
INTRAMUSCULAR | Status: AC
  Administered 2017-09-24: 25 ug via INTRAVENOUS
  Filled 2017-09-24: qty 2

## 2017-09-24 MED ORDER — METHOCARBAMOL 500 MG PO TABS
500.0000 mg | ORAL_TABLET | Freq: Four times a day (QID) | ORAL | Status: DC | PRN
  Administered 2017-09-24 – 2017-09-26 (×5): 500 mg via ORAL
  Filled 2017-09-24 (×5): qty 1

## 2017-09-24 MED ORDER — CEFAZOLIN SODIUM-DEXTROSE 2-4 GM/100ML-% IV SOLN
2.0000 g | Freq: Four times a day (QID) | INTRAVENOUS | Status: AC
  Administered 2017-09-24 – 2017-09-25 (×2): 2 g via INTRAVENOUS
  Filled 2017-09-24 (×2): qty 100

## 2017-09-24 MED ORDER — FENTANYL CITRATE (PF) 100 MCG/2ML IJ SOLN
25.0000 ug | INTRAMUSCULAR | Status: DC | PRN
  Administered 2017-09-24: 25 ug via INTRAVENOUS
  Administered 2017-09-24: 50 ug via INTRAVENOUS
  Administered 2017-09-24: 25 ug via INTRAVENOUS
  Administered 2017-09-24: 50 ug via INTRAVENOUS

## 2017-09-24 MED ORDER — ONDANSETRON HCL 4 MG/2ML IJ SOLN
INTRAMUSCULAR | Status: AC
  Filled 2017-09-24: qty 2

## 2017-09-24 MED ORDER — FENTANYL CITRATE (PF) 100 MCG/2ML IJ SOLN
INTRAMUSCULAR | Status: DC | PRN
  Administered 2017-09-24 (×2): 50 ug via INTRAVENOUS

## 2017-09-24 MED ORDER — STERILE WATER FOR IRRIGATION IR SOLN
Status: DC | PRN
  Administered 2017-09-24: 2000 mL

## 2017-09-24 MED ORDER — LACTATED RINGERS IV SOLN
INTRAVENOUS | Status: DC
  Administered 2017-09-24 (×2): via INTRAVENOUS

## 2017-09-24 MED ORDER — PROPOFOL 500 MG/50ML IV EMUL
INTRAVENOUS | Status: DC | PRN
  Administered 2017-09-24: 50 ug/kg/min via INTRAVENOUS

## 2017-09-24 MED ORDER — MENTHOL 3 MG MT LOZG: 1.0000 | LOZENGE | OROMUCOSAL | Status: DC | PRN

## 2017-09-24 MED ORDER — TIMOLOL MALEATE 0.5 % OP SOLN
1.0000 [drp] | Freq: Every day | OPHTHALMIC | Status: DC
  Administered 2017-09-25 – 2017-09-26 (×2): 1 [drp] via OPHTHALMIC
  Filled 2017-09-24: qty 5

## 2017-09-24 MED ORDER — FENTANYL CITRATE (PF) 100 MCG/2ML IJ SOLN
INTRAMUSCULAR | Status: AC
  Administered 2017-09-24: 50 ug via INTRAVENOUS
  Filled 2017-09-24: qty 2

## 2017-09-24 MED ORDER — DEXAMETHASONE SODIUM PHOSPHATE 10 MG/ML IJ SOLN
INTRAMUSCULAR | Status: AC
  Filled 2017-09-24: qty 1

## 2017-09-24 MED ORDER — ACETAMINOPHEN 10 MG/ML IV SOLN
1000.0000 mg | Freq: Once | INTRAVENOUS | Status: AC
  Administered 2017-09-24: 1000 mg via INTRAVENOUS
  Filled 2017-09-24: qty 100

## 2017-09-24 MED ORDER — PROPOFOL 10 MG/ML IV BOLUS
INTRAVENOUS | Status: AC
  Filled 2017-09-24: qty 20

## 2017-09-24 MED ORDER — PROPOFOL 10 MG/ML IV BOLUS
INTRAVENOUS | Status: AC
  Filled 2017-09-24: qty 40

## 2017-09-24 MED ORDER — BUPIVACAINE HCL (PF) 0.25 % IJ SOLN
INTRAMUSCULAR | Status: DC | PRN
  Administered 2017-09-24: 30 mL

## 2017-09-24 MED ORDER — FLEET ENEMA 7-19 GM/118ML RE ENEM: 1.0000 | ENEMA | Freq: Once | RECTAL | Status: DC | PRN

## 2017-09-24 MED ORDER — LATANOPROST 0.005 % OP SOLN
1.0000 [drp] | Freq: Every day | OPHTHALMIC | Status: DC
  Administered 2017-09-24: 1 [drp] via OPHTHALMIC
  Filled 2017-09-24: qty 2.5

## 2017-09-24 MED ORDER — RIVAROXABAN 10 MG PO TABS
10.0000 mg | ORAL_TABLET | Freq: Every day | ORAL | Status: DC
  Administered 2017-09-25 – 2017-09-26 (×2): 10 mg via ORAL
  Filled 2017-09-24 (×2): qty 1

## 2017-09-24 MED ORDER — MORPHINE SULFATE (PF) 4 MG/ML IV SOLN
1.0000 mg | INTRAVENOUS | Status: DC | PRN
  Administered 2017-09-24: 22:00:00 1 mg via INTRAVENOUS
  Filled 2017-09-24: qty 1

## 2017-09-24 MED ORDER — OXYCODONE HCL 5 MG PO TABS: 5.0000 mg | ORAL_TABLET | ORAL | Status: DC | PRN

## 2017-09-24 MED ORDER — CHLORHEXIDINE GLUCONATE 4 % EX LIQD: 60.0000 mL | Freq: Once | CUTANEOUS | Status: DC

## 2017-09-24 MED ORDER — CEFAZOLIN SODIUM-DEXTROSE 2-4 GM/100ML-% IV SOLN
2.0000 g | INTRAVENOUS | Status: AC
  Administered 2017-09-24: 2 g via INTRAVENOUS
  Filled 2017-09-24: qty 100

## 2017-09-24 MED ORDER — METHOCARBAMOL 1000 MG/10ML IJ SOLN
500.0000 mg | Freq: Four times a day (QID) | INTRAVENOUS | Status: DC | PRN
  Administered 2017-09-24: 500 mg via INTRAVENOUS
  Filled 2017-09-24: qty 550

## 2017-09-24 MED ORDER — PROMETHAZINE HCL 25 MG/ML IJ SOLN: 6.2500 mg | INTRAMUSCULAR | Status: DC | PRN

## 2017-09-24 MED ORDER — MIDAZOLAM HCL 5 MG/5ML IJ SOLN
INTRAMUSCULAR | Status: DC | PRN
  Administered 2017-09-24 (×2): 1 mg via INTRAVENOUS

## 2017-09-24 MED ORDER — ONDANSETRON HCL 4 MG/2ML IJ SOLN: 4.0000 mg | Freq: Four times a day (QID) | INTRAMUSCULAR | Status: DC | PRN

## 2017-09-24 MED ORDER — ACETAMINOPHEN 325 MG PO TABS: 650.0000 mg | ORAL_TABLET | ORAL | Status: DC | PRN

## 2017-09-24 MED ORDER — POLYETHYLENE GLYCOL 3350 17 G PO PACK
17.0000 g | PACK | Freq: Every day | ORAL | Status: DC | PRN
  Filled 2017-09-24: qty 1

## 2017-09-24 MED ORDER — LAMOTRIGINE 100 MG PO TABS
200.0000 mg | ORAL_TABLET | Freq: Every morning | ORAL | Status: DC
  Administered 2017-09-25 – 2017-09-26 (×2): 200 mg via ORAL
  Filled 2017-09-24 (×2): qty 2

## 2017-09-24 MED ORDER — VENLAFAXINE HCL ER 150 MG PO CP24
150.0000 mg | ORAL_CAPSULE | Freq: Every day | ORAL | Status: DC
  Administered 2017-09-25 – 2017-09-26 (×2): 150 mg via ORAL
  Filled 2017-09-24 (×2): qty 1

## 2017-09-24 MED ORDER — DEXAMETHASONE SODIUM PHOSPHATE 10 MG/ML IJ SOLN
10.0000 mg | Freq: Once | INTRAMUSCULAR | Status: AC
  Administered 2017-09-24: 10 mg via INTRAVENOUS

## 2017-09-24 MED ORDER — MIDAZOLAM HCL 2 MG/2ML IJ SOLN
INTRAMUSCULAR | Status: AC
  Filled 2017-09-24: qty 2

## 2017-09-24 MED ORDER — DIPHENHYDRAMINE HCL 12.5 MG/5ML PO ELIX: 12.5000 mg | ORAL_SOLUTION | ORAL | Status: DC | PRN

## 2017-09-24 MED ORDER — ZOLPIDEM TARTRATE 5 MG PO TABS: 5.0000 mg | ORAL_TABLET | Freq: Every evening | ORAL | Status: DC | PRN

## 2017-09-24 MED ORDER — DOCUSATE SODIUM 100 MG PO CAPS
100.0000 mg | ORAL_CAPSULE | Freq: Two times a day (BID) | ORAL | Status: DC
  Administered 2017-09-24 – 2017-09-26 (×3): 100 mg via ORAL
  Filled 2017-09-24 (×4): qty 1

## 2017-09-24 SURGICAL SUPPLY — 35 items
BAG DECANTER FOR FLEXI CONT (MISCELLANEOUS) ×3 IMPLANT
BAG SPEC THK2 15X12 ZIP CLS (MISCELLANEOUS)
BAG ZIPLOCK 12X15 (MISCELLANEOUS) IMPLANT
BLADE SAG 18X100X1.27 (BLADE) ×3 IMPLANT
CAPT HIP TOTAL 2 ×2 IMPLANT
CLOSURE WOUND 1/2 X4 (GAUZE/BANDAGES/DRESSINGS) ×1
CLOTH BEACON ORANGE TIMEOUT ST (SAFETY) ×3 IMPLANT
COVER PERINEAL POST (MISCELLANEOUS) ×3 IMPLANT
COVER SURGICAL LIGHT HANDLE (MISCELLANEOUS) ×3 IMPLANT
DECANTER SPIKE VIAL GLASS SM (MISCELLANEOUS) ×3 IMPLANT
DRAPE STERI IOBAN 125X83 (DRAPES) ×3 IMPLANT
DRAPE U-SHAPE 47X51 STRL (DRAPES) ×6 IMPLANT
DRSG ADAPTIC 3X8 NADH LF (GAUZE/BANDAGES/DRESSINGS) ×3 IMPLANT
DRSG MEPILEX BORDER 4X4 (GAUZE/BANDAGES/DRESSINGS) ×3 IMPLANT
DRSG MEPILEX BORDER 4X8 (GAUZE/BANDAGES/DRESSINGS) ×3 IMPLANT
DURAPREP 26ML APPLICATOR (WOUND CARE) ×3 IMPLANT
ELECT REM PT RETURN 15FT ADLT (MISCELLANEOUS) ×3 IMPLANT
EVACUATOR 1/8 PVC DRAIN (DRAIN) ×3 IMPLANT
GLOVE BIO SURGEON STRL SZ7.5 (GLOVE) ×3 IMPLANT
GLOVE BIO SURGEON STRL SZ8 (GLOVE) ×6 IMPLANT
GLOVE BIOGEL PI IND STRL 8 (GLOVE) ×2 IMPLANT
GLOVE BIOGEL PI INDICATOR 8 (GLOVE) ×4
GOWN STRL REUS W/TWL LRG LVL3 (GOWN DISPOSABLE) ×9 IMPLANT
GOWN STRL REUS W/TWL XL LVL3 (GOWN DISPOSABLE) ×3 IMPLANT
PACK ANTERIOR HIP CUSTOM (KITS) ×3 IMPLANT
STRIP CLOSURE SKIN 1/2X4 (GAUZE/BANDAGES/DRESSINGS) ×2 IMPLANT
SUT ETHIBOND NAB CT1 #1 30IN (SUTURE) ×3 IMPLANT
SUT MNCRL AB 4-0 PS2 18 (SUTURE) ×3 IMPLANT
SUT STRATAFIX 0 PDS 27 VIOLET (SUTURE) ×3
SUT VIC AB 2-0 CT1 27 (SUTURE) ×6
SUT VIC AB 2-0 CT1 TAPERPNT 27 (SUTURE) ×2 IMPLANT
SUTURE STRATFX 0 PDS 27 VIOLET (SUTURE) ×1 IMPLANT
SYR 50ML LL SCALE MARK (SYRINGE) IMPLANT
TRAY FOLEY CATH 14FR (SET/KITS/TRAYS/PACK) ×2 IMPLANT
YANKAUER SUCT BULB TIP 10FT TU (MISCELLANEOUS) ×3 IMPLANT

## 2017-09-24 NOTE — Anesthesia Postprocedure Evaluation (Signed)
Anesthesia Post Note  Patient: [name] [name]  Procedure(s) Performed: LEFT TOTAL HIP ARTHROPLASTY ANTERIOR APPROACH (Left Hip)     Patient location during evaluation: PACU Anesthesia Type: MAC Level of consciousness: oriented and awake and alert Pain management: pain level controlled Vital Signs Assessment: post-procedure vital signs reviewed and stable Respiratory status: spontaneous breathing, respiratory function stable and patient connected to nasal cannula oxygen Cardiovascular status: blood pressure returned to baseline and stable Postop Assessment: no headache, no backache, no apparent nausea or vomiting and spinal receding Anesthetic complications: no    Last Vitals:  Vitals:   09/24/17 1630 09/24/17 1645  BP: (!) 152/80 (!) 141/92  Pulse: (!) 54 66  Resp: 13 16  Temp:  36.5 C  SpO2: 100% 100%               [name]

## 2017-09-24 NOTE — Anesthesia Procedure Notes (Signed)
Procedure Name: MAC Date/Time: 09/24/2017 1:23 PM Performed by: West Pugh, CRNA Pre-anesthesia Checklist: Patient identified, Emergency Drugs available, Suction available, Patient being monitored and Timeout performed Oxygen Delivery Method: Simple face mask Placement Confirmation: positive ETCO2 Dental Injury: Teeth and Oropharynx as per pre-operative assessment

## 2017-09-24 NOTE — Anesthesia Procedure Notes (Signed)
Spinal  Patient location during procedure: OR Start time: 09/24/2017 1:26 PM End time: 09/24/2017 1:29 PM Reason for block: at surgeon's request Staffing Resident/CRNA: West Pugh, CRNA Performed: resident/CRNA  Preanesthetic Checklist Completed: patient identified, site marked, surgical consent, pre-op evaluation, timeout performed, IV checked, risks and benefits discussed and monitors and equipment checked Spinal Block Patient position: sitting Prep: DuraPrep Patient monitoring: heart rate, continuous pulse ox and blood pressure Approach: midline Location: L3-4 Injection technique: single-shot Needle Needle type: Pencan  Needle gauge: 24 G Needle length: 9 cm Assessment Sensory level: T4 Additional Notes Expiration of kit checked and confirmed. Patient tolerated procedure well,without complications x 1 attempt with noted clear CSF. Loss of motor and sensory on exam post injection.

## 2017-09-24 NOTE — Op Note (Signed)
OPERATIVE REPORT- TOTAL HIP ARTHROPLASTY   PREOPERATIVE DIAGNOSIS: Osteoarthritis of the Left hip.   POSTOPERATIVE DIAGNOSIS: Osteoarthritis of the Left  hip.   PROCEDURE: Left total hip arthroplasty, anterior approach.   SURGEON: Ollen GrossFrank Deklynn Charlet, MD   ASSISTANT: Avel Peacerew Perkins, PA-C  ANESTHESIA:  Spinal  ESTIMATED BLOOD LOSS:-400 mL    DRAINS: Hemovac x1.   COMPLICATIONS: None   CONDITION: PACU - hemodynamically stable.   BRIEF CLINICAL NOTE: [name] [name] is a 62 y.o. female who has advanced end-  stage arthritis of their Left  hip with progressively worsening pain and  dysfunction.The patient has failed nonoperative management and presents for  total hip arthroplasty.   PROCEDURE IN DETAIL: After successful administration of spinal  anesthetic, the traction boots for the [facility] bed were placed on both  feet and the patient was [facility] the [name], boots placed into the leg  holders. The Left hip was then isolated from the perineum with plastic  drapes and prepped and draped in the usual sterile fashion. ASIS and  greater trochanter were marked and a oblique incision was made, starting  at about 1 cm lateral and 2 cm distal to the ASIS and coursing towards  the anterior cortex of the femur. The skin was cut with a 10 blade  through subcutaneous tissue to the level of the fascia overlying the  tensor fascia lata muscle. The fascia was then incised in line with the  incision at the junction of the anterior third and posterior 2/3rd. The  muscle was teased off the fascia and then the interval between the TFL  and the rectus was developed. The Hohmann retractor was then placed at  the top of the femoral neck over the capsule. The vessels overlying the  capsule were cauterized and the fat on top of the capsule was removed.  A Hohmann retractor was then placed anterior underneath the rectus  femoris to give exposure to the entire anterior capsule. A T-shaped   capsulotomy was performed. The edges were tagged and the femoral head  was identified.       Osteophytes are removed off the superior acetabulum.  The femoral neck was then cut in situ with an oscillating saw. Traction  was then applied to the left lower extremity utilizing the [facility]  traction. The femoral head was then removed. Retractors were placed  around the acetabulum and then circumferential removal of the labrum was  performed. Osteophytes were also removed. Reaming starts at 45 mm to  medialize and  Increased in 2 mm increments to 49 mm. We reamed in  approximately 40 degrees of abduction, 20 degrees anteversion. A 50 mm  pinnacle acetabular shell was then impacted in anatomic position under  fluoroscopic guidance with excellent purchase. We did not need to place  any additional dome screws. A 32 mm neutral + 4 marathon liner was then  placed into the acetabular shell.       The femoral lift was then placed along the lateral aspect of the femur  just distal to the vastus ridge. The leg was  externally rotated and capsule  was stripped off the inferior aspect of the femoral neck down to the  level of the lesser trochanter, this was done with electrocautery. The femur was lifted after this was performed. The  leg was then placed in an extended and adducted position essentially delivering the femur. We also removed the capsule superiorly and the piriformis from the piriformis  fossa to gain excellent exposure of the  proximal femur. Rongeur was used to remove some cancellous bone to get  into the lateral portion of the proximal femur for placement of the  initial starter reamer. The starter broaches was placed  the starter broach  and was shown to go down the center of the canal. Broaching  with the  Corail system was then performed starting at size 8, coursing  Up to size 9. A size 9 had excellent torsional and rotational  and axial stability. The trial standard offset neck was then  placed  with a 32 + 5 trial head. The hip was then reduced. We confirmed that  the stem was in the canal both on AP and lateral x-rays. It also has excellent sizing. The hip was reduced with outstanding stability through full extension and full external rotation.. AP pelvis was taken and the leg lengths were measured and found to be equal. Hip was then dislocated again and the femoral head and neck removed. The  femoral broach was removed. Size 9 Corail stem with a standard offset  neck was then impacted into the femur following native anteversion. Has  excellent purchase in the canal. Excellent torsional and rotational and  axial stability. It is confirmed to be in the canal on AP and lateral  fluoroscopic views. The 32 + 5 ceramic head was placed and the hip  reduced with outstanding stability. Again AP pelvis was taken and it  confirmed that the leg lengths were equal. The wound was then copiously  irrigated with saline solution and the capsule reattached and repaired  with Ethibond suture. 30 ml of .25% Bupivicaine was  injected into the capsule and into the edge of the tensor fascia lata as well as subcutaneous tissue. The fascia overlying the tensor fascia lata was then closed with a running #1 V-Loc. Subcu was closed with interrupted 2-0 Vicryl and subcuticular running 4-0 Monocryl. Incision was cleaned  and dried. Steri-Strips and a bulky sterile dressing applied. Hemovac  drain was hooked to suction and then the patient was awakened and transported to  recovery in stable condition.        Please note that a surgical assistant was a medical necessity for this procedure to perform it in a safe and expeditious manner. Assistant was necessary to provide appropriate retraction of vital neurovascular structures and to prevent femoral fracture and allow for anatomic placement of the prosthesis.  Ollen Gross, M.D.

## 2017-09-24 NOTE — Progress Notes (Signed)
Pt will self administer BIPAP when ready for bed.  RT to monitor and assess as needed.

## 2017-09-24 NOTE — Discharge Instructions (Addendum)
° °Dr. Frank Aluisio °Total Joint Specialist °[facility] °[address] °[place], [place] [address] °[phone] ° °ANTERIOR APPROACH TOTAL HIP REPLACEMENT POSTOPERATIVE DIRECTIONS ° ° °Hip Rehabilitation, Guidelines Following Surgery  °The results of a hip operation are greatly improved after range of motion and muscle strengthening exercises. Follow all safety measures which are given to protect your hip. If any of these exercises cause increased pain or swelling in your joint, decrease the amount until you are comfortable again. Then slowly increase the exercises. Call your caregiver if you have problems or questions.  ° °HOME CARE INSTRUCTIONS  °Remove items at home which could result in a fall. This includes throw rugs or furniture in walking pathways.  °· ICE to the affected hip every three hours for 30 minutes at a time and then as needed for pain and swelling.  Continue to use ice on the hip for pain and swelling from surgery. You may notice swelling that will progress down to the foot and ankle.  This is normal after surgery.  Elevate the leg when you are not up walking on it.   °· Continue to use the breathing machine which will help keep your temperature down.  It is common for your temperature to cycle up and down following surgery, especially at night when you are not up moving around and exerting yourself.  The breathing machine keeps your lungs expanded and your temperature down. ° ° °DIET °You may resume your previous home diet once your are discharged from the hospital. ° °DRESSING / WOUND CARE / SHOWERING °You may shower 3 days after surgery, but keep the wounds dry during showering.  You may use an occlusive plastic wrap (Press'n Seal for example), NO SOAKING/SUBMERGING IN THE BATHTUB.  If the bandage gets wet, change with a clean dry gauze.  If the incision gets wet, pat the wound dry with a clean towel. °You may start showering once you are discharged home but do not submerge  the incision under water. Just pat the incision dry and apply a dry gauze dressing on daily. °Change the surgical dressing daily and reapply a dry dressing each time. ° °ACTIVITY °Walk with your walker as instructed. °Use walker as long as suggested by your caregivers. °Avoid periods of inactivity such as sitting longer than an hour when not asleep. This helps prevent blood clots.  °You may resume a sexual relationship in one month or when given the OK by your doctor.  °You may return to work once you are cleared by your doctor.  °Do not drive a car for 6 weeks or until released by you surgeon.  °Do not drive while taking narcotics. ° °WEIGHT BEARING °Weight bearing as tolerated with assist device (walker, cane, etc) as directed, use it as long as suggested by your surgeon or therapist, typically at least 4-6 weeks. ° °POSTOPERATIVE CONSTIPATION PROTOCOL °Constipation - defined medically as fewer than three stools per week and severe constipation as less than one stool per week. ° °One of the most common issues patients have following surgery is constipation.  Even if you have a regular bowel pattern at home, your normal regimen is likely to be disrupted due to multiple reasons following surgery.  Combination of anesthesia, postoperative narcotics, change in appetite and fluid intake all can affect your bowels.  In order to avoid complications following surgery, here are some recommendations in order to help you during your recovery period. ° °Colace (docusate) - Pick up an over-the-counter   form of Colace or another stool softener and take twice a day as long as you are requiring postoperative pain medications.  Take with a full glass of water daily.  If you experience loose stools or diarrhea, hold the colace until you stool forms back up.  If your symptoms do not get better within 1 week or if they get worse, check with your doctor. ° °Dulcolax (bisacodyl) - Pick up over-the-counter and take as directed by the  product packaging as needed to assist with the movement of your bowels.  Take with a full glass of water.  Use this product as needed if not relieved by Colace only.  ° °MiraLax (polyethylene glycol) - Pick up over-the-counter to have on hand.  MiraLax is a solution that will increase the amount of water in your bowels to assist with bowel movements.  Take as directed and can mix with a glass of water, juice, soda, coffee, or tea.  Take if you go more than two days without a movement. °Do not use MiraLax more than once per day. Call your doctor if you are still constipated or irregular after using this medication for 7 days in a row. ° °If you continue to have problems with postoperative constipation, please contact the office for further assistance and recommendations.  If you experience "the worst abdominal pain ever" or develop nausea or vomiting, please contact the office immediatly for further recommendations for treatment. ° °ITCHING ° If you experience itching with your medications, try taking only a single pain pill, or even half a pain pill at a time.  You can also use Benadryl over the counter for itching or also to help with sleep.  ° °TED HOSE STOCKINGS °Wear the elastic stockings on both legs for three weeks following surgery during the day but you may remove then at night for sleeping. ° °MEDICATIONS °See your medication summary on the “After Visit Summary” that the nursing staff will review with you prior to discharge.  You may have some home medications which will be placed on hold until you complete the course of blood thinner medication.  It is important for you to complete the blood thinner medication as prescribed by your surgeon.  Continue your approved medications as instructed at time of discharge. ° °PRECAUTIONS °If you experience chest pain or shortness of breath - call 911 immediately for transfer to the hospital emergency department.  °If you develop a fever greater that 101 F, purulent  drainage from wound, increased redness or drainage from wound, foul odor from the wound/dressing, or calf pain - CONTACT YOUR SURGEON.   °                                                °FOLLOW-UP APPOINTMENTS °Make sure you keep all of your appointments after your operation with your surgeon and caregivers. You should call the office at the above phone number and make an appointment for approximately two weeks after the date of your surgery or on the date instructed by your surgeon outlined in the "After Visit Summary". ° °RANGE OF MOTION AND STRENGTHENING EXERCISES  °These exercises are designed to help you keep full movement of your hip joint. Follow your caregiver's or physical therapist's instructions. Perform all exercises about fifteen times, three times per day or as directed. Exercise both hips, even if you   have had only one joint replacement. These exercises can be done on a training (exercise) mat, on the floor, on a table or on a bed. Use whatever works the best and is most comfortable for you. Use music or television while you are exercising so that the exercises are a pleasant break in your day. This will make your life better with the exercises acting as a break in routine you can look forward to.  °Lying on your back, slowly slide your foot toward your buttocks, raising your knee up off the floor. Then slowly slide your foot back down until your leg is straight again.  °Lying on your back spread your legs as far apart as you can without causing discomfort.  °Lying on your side, raise your upper leg and foot straight up from the floor as far as is comfortable. Slowly lower the leg and repeat.  °Lying on your back, tighten up the muscle in the front of your thigh (quadriceps muscles). You can do this by keeping your leg straight and trying to raise your heel off the floor. This helps strengthen the largest muscle supporting your knee.  °Lying on your back, tighten up the muscles of your buttocks both  with the legs straight and with the knee bent at a comfortable angle while keeping your heel on the floor.  ° °IF YOU ARE TRANSFERRED TO A SKILLED REHAB FACILITY °If the patient is transferred to a skilled rehab facility following release from the hospital, a list of the current medications will be sent to the facility for the patient to continue.  When discharged from the skilled rehab facility, please have the facility set up the patient's Home Health Physical Therapy prior to being released. Also, the skilled facility will be responsible for providing the patient with their medications at time of release from the facility to include their pain medication, the muscle relaxants, and their blood thinner medication. If the patient is still at the rehab facility at time of the two week follow up appointment, the skilled rehab facility will also need to assist the patient in arranging follow up appointment in our office and any transportation needs. ° °MAKE SURE YOU:  °Understand these instructions.  °Get help right away if you are not doing well or get worse.  ° ° °Pick up stool softner and laxative for home use following surgery while on pain medications. °Do not submerge incision under water. °Please use good hand washing techniques while changing dressing each day. °May shower starting three days after surgery. °Please use a clean towel to pat the incision dry following showers. °Continue to use ice for pain and swelling after surgery. °Do not use any lotions or creams on the incision until instructed by your surgeon. ° °Take Xarelto for two and a half more weeks following discharge from the hospital, then discontinue Xarelto. °Once the patient has completed the blood thinner regimen, then take a Baby 81 mg Aspirin daily for three more weeks. ° ° ° °Information on my medicine - XARELTO® (Rivaroxaban) ° ° °Why was Xarelto® prescribed for you? °Xarelto® was prescribed for you to reduce the risk of blood clots forming  after orthopedic surgery. The medical term for these abnormal blood clots is venous thromboembolism (VTE). ° °What do you need to know about xarelto® ? °Take your Xarelto® ONCE DAILY at the same time every day. °You may take it either with or without food. ° °If you have difficulty swallowing the tablet whole, you may   crush it and mix in applesauce just prior to taking your dose. ° °Take Xarelto® exactly as prescribed by your doctor and DO NOT stop taking Xarelto® without talking to the doctor who prescribed the medication.  Stopping without other VTE prevention medication to take the place of Xarelto® may increase your risk of developing a clot. ° °After discharge, you should have regular check-up appointments with your healthcare provider that is prescribing your Xarelto®.   ° °What do you do if you miss a dose? °If you miss a dose, take it as soon as you remember on the same day then continue your regularly scheduled once daily regimen the next day. Do not take two doses of Xarelto® on the same day.  ° °Important Safety Information °A possible side effect of Xarelto® is bleeding. You should call your healthcare provider right away if you experience any of the following: °? Bleeding from an injury or your nose that does not stop. °? Unusual colored urine (red or dark brown) or unusual colored stools (red or black). °? Unusual bruising for unknown reasons. °? A serious fall or if you hit your head (even if there is no bleeding). ° °Some medicines may interact with Xarelto® and might increase your risk of bleeding while on Xarelto®. To help avoid this, consult your healthcare provider or pharmacist prior to using any new prescription or non-prescription medications, including herbals, vitamins, non-steroidal anti-inflammatory drugs (NSAIDs) and supplements. ° °This website has more information on Xarelto®: www.xarelto.com. ° ° °

## 2017-09-24 NOTE — Transfer of Care (Signed)
Immediate Anesthesia Transfer of Care Note  Patient: [name] [name]  Procedure(s) Performed: LEFT TOTAL HIP ARTHROPLASTY ANTERIOR APPROACH (Left Hip)  Patient Location: PACU  Anesthesia Type:Spinal and MAC  Level of Consciousness: awake, alert , oriented and patient cooperative  Airway & Oxygen Therapy: Patient Spontanous Breathing and Patient connected to face mask oxygen  Post-op Assessment: Report given to RN and Post -op Vital signs reviewed and stable  Post vital signs: Reviewed and stable  Last Vitals:  Vitals:   09/24/17 1136  BP: 139/89  Pulse: 71  Resp: 16  Temp: 37 C  SpO2: 98%    Last Pain:  Vitals:   09/24/17 1200  TempSrc:   PainSc: 6       Patients Stated Pain Goal: 4 (68/37/29 0211)  Complications: No apparent anesthesia complications

## 2017-09-24 NOTE — Interval H&P Note (Signed)
History and Physical Interval Note:  09/24/2017 12:27 PM  [name] [name]  has presented today for surgery, with the diagnosis of Osteoarthritis Left Hip  The various methods of treatment have been discussed with the patient and family. After consideration of risks, benefits and other options for treatment, the patient has consented to  Procedure(s): LEFT TOTAL HIP ARTHROPLASTY ANTERIOR APPROACH (Left) as a surgical intervention .  The patient's history has been reviewed, patient examined, no change in status, stable for surgery.  I have reviewed the patient's chart and labs.  Questions were answered to the patient's satisfaction.     Homero FellersFrank Delores Thelen

## 2017-09-24 NOTE — Anesthesia Preprocedure Evaluation (Signed)
Anesthesia Evaluation  Patient identified by MRN, date of birth, ID band Patient awake    Reviewed: Allergy & Precautions, H&P , NPO status , Patient's Chart, lab work & pertinent test results  Airway Mallampati: II  TM Distance: >3 FB Neck ROM: Full    Dental no notable dental hx.    Pulmonary sleep apnea and Continuous Positive Airway Pressure Ventilation , former smoker,    Pulmonary exam normal breath sounds clear to auscultation       Cardiovascular negative cardio ROS Normal cardiovascular exam Rhythm:Regular Rate:Normal     Neuro/Psych PSYCHIATRIC DISORDERS Anxiety Depression negative neurological ROS     GI/Hepatic negative GI ROS, Neg liver ROS,   Endo/Other    Renal/GU negative Renal ROS  negative genitourinary   Musculoskeletal  (+) Arthritis ,   Abdominal   Peds negative pediatric ROS (+)  Hematology negative hematology ROS (+)   Anesthesia Other Findings   Reproductive/Obstetrics negative OB ROS                             Anesthesia Physical  Anesthesia Plan  ASA: III  Anesthesia Plan: Spinal and MAC   Post-op Pain Management:    Induction:   PONV Risk Score and Plan: 2 and Ondansetron and Propofol infusion  Airway Management Planned: Simple Face Mask  Additional Equipment:   Intra-op Plan:   Post-operative Plan:   Informed Consent: I have reviewed the patients History and Physical, chart, labs and discussed the procedure including the risks, benefits and alternatives for the proposed anesthesia with the patient or authorized representative who has indicated his/her understanding and acceptance.   Dental advisory given  Plan Discussed with: CRNA and Anesthesiologist  Anesthesia Plan Comments:         Anesthesia Quick Evaluation

## 2017-09-25 ENCOUNTER — Encounter (HOSPITAL_COMMUNITY): Payer: Self-pay | Admitting: Orthopedic Surgery

## 2017-09-25 LAB — BASIC METABOLIC PANEL
Anion gap: 10 (ref 5–15)
BUN: 11 mg/dL (ref 6–20)
CHLORIDE: 102 mmol/L (ref 101–111)
CO2: 27 mmol/L (ref 22–32)
CREATININE: 0.61 mg/dL (ref 0.44–1.00)
Calcium: 9 mg/dL (ref 8.9–10.3)
GFR calc Af Amer: >60 mL/min (ref >60)
GLUCOSE: 127 mg/dL — AB (ref 65–99)
POTASSIUM: 4.1 mmol/L (ref 3.5–5.1)
Sodium: 139 mmol/L (ref 135–145)

## 2017-09-25 LAB — CBC
HCT: 29.7 % — ABNORMAL LOW (ref 36.0–46.0)
Hemoglobin: 10 g/dL — ABNORMAL LOW (ref 12.0–15.0)
MCH: 32.6 pg (ref 26.0–34.0)
MCHC: 33.7 g/dL (ref 30.0–36.0)
MCV: 96.7 fL (ref 78.0–100.0)
PLATELETS: 287 10*3/uL (ref 150–400)
RBC: 3.07 MIL/uL — AB (ref 3.87–5.11)
RDW: 12 % (ref 11.5–15.5)
WBC: 10.2 10*3/uL (ref 4.0–10.5)

## 2017-09-25 MED ORDER — METHOCARBAMOL 500 MG PO TABS: 500.0000 mg | ORAL_TABLET | Freq: Four times a day (QID) | ORAL | 0 refills | Status: DC | PRN

## 2017-09-25 MED ORDER — RIVAROXABAN 10 MG PO TABS: 10.0000 mg | ORAL_TABLET | Freq: Every day | ORAL | 0 refills | Status: DC

## 2017-09-25 MED ORDER — OXYCODONE HCL 5 MG PO TABS: 5.0000 mg | ORAL_TABLET | ORAL | 0 refills | Status: DC | PRN

## 2017-09-25 NOTE — Discharge Summary (Signed)
Physician Discharge Summary   Patient ID: [name] [name] MRN: [id number] DOB/AGE: [id number] y.o.  Admit date: 09/24/2017 Discharge date: 09/26/17   Primary Diagnosis:  Osteoarthritis of the Left  hip   Admission Diagnoses:  Past Medical History:  Diagnosis Date  . Anxiety   . Deafness    Wears Bilat hearing aids since Childhood  . Depression   . Environmental and seasonal allergies   . Glaucoma, both eyes   . Hyperlipidemia   . Mild cognitive impairment, so stated 07/16/2013   Patient subjectively complains of word finding and memory deficits. MOCA 30-30 on 07-16-13 , paraphrasic errors- uses words such as  closet for garage   . OA (osteoarthritis)    "all over"  . Obstructive sleep apnea treated with BiPAP   . Right arm weakness    upper due to shoulder issues  . Wears glasses   . Wears hearing aid in both ears    Discharge Diagnoses:   Principal Problem:   OA (osteoarthritis) of hip  Estimated body mass index is 33.59 kg/m as calculated from the following:   Height as of this encounter: 5' (1.524 m).   Weight as of this encounter: 78 kg (172 lb).  Procedure(s) (LRB): LEFT TOTAL HIP ARTHROPLASTY ANTERIOR APPROACH (Left)   Consults: None  HPI: [name] [name] is a 62 y.o. female who has advanced end-  stage arthritis of their Left  hip with progressively worsening pain and  dysfunction.The patient has failed nonoperative management and presents for  total hip arthroplasty.   Laboratory Data: Admission on 09/24/2017  Component Date Value Ref Range Status  . WBC 09/25/2017 10.2  4.0 - 10.5 K/uL Final  . RBC 09/25/2017 3.07* 3.87 - 5.11 MIL/uL Final  . Hemoglobin 09/25/2017 10.0* 12.0 - 15.0 g/dL Final  . HCT 09/25/2017 29.7* 36.0 - 46.0 % Final  . MCV 09/25/2017 96.7  78.0 - 100.0 fL Final  . MCH 09/25/2017 32.6  26.0 - 34.0 pg Final  . MCHC 09/25/2017 33.7  30.0 - 36.0 g/dL Final  . RDW 09/25/2017 12.0  11.5 - 15.5 % Final  . Platelets 09/25/2017 287  150 -  400 K/uL Final   Performed at [facility], [place] [address] [place], [place] [address]  . Sodium 09/25/2017 139  135 - 145 mmol/L Final  . Potassium 09/25/2017 4.1  3.5 - 5.1 mmol/L Final  . Chloride 09/25/2017 102  101 - 111 mmol/L Final  . CO2 09/25/2017 27  22 - 32 mmol/L Final  . Glucose, Bld 09/25/2017 127* 65 - 99 mg/dL Final  . BUN 09/25/2017 11  6 - 20 mg/dL Final  . Creatinine, Ser 09/25/2017 0.61  0.44 - 1.00 mg/dL Final  . Calcium 09/25/2017 9.0  8.9 - 10.3 mg/dL Final  . GFR calc non Af Amer 09/25/2017 >60  >60 mL/min Final  . GFR calc Af Amer 09/25/2017 >60  >60 mL/min Final   Comment: (NOTE) The eGFR has been calculated using the CKD EPI equation. This calculation has not been validated in all clinical situations. eGFR's persistently <60 mL/min signify possible Chronic Kidney Disease.   Georgiann Hahn gap 09/25/2017 10  5 - 15 Final   Performed at [facility], [place] [address], [place], [place] [address]  Hospital Outpatient Visit on 09/19/2017  Component Date Value Ref Range Status  . aPTT 09/19/2017 28  24 - 36 seconds Final   Performed at [facility], [place]  [address], [place], [place] [address]  . WBC 09/19/2017 6.0  4.0 - 10.5 K/uL Final  . RBC 09/19/2017 3.73* 3.87 - 5.11 MIL/uL Final  . Hemoglobin 09/19/2017 11.9* 12.0 - 15.0 g/dL Final  . HCT 09/19/2017 36.6  36.0 - 46.0 % Final  . MCV 09/19/2017 98.1  78.0 - 100.0 fL Final  . MCH 09/19/2017 31.9  26.0 - 34.0 pg Final  . MCHC 09/19/2017 32.5  30.0 - 36.0 g/dL Final  . RDW 09/19/2017 12.1  11.5 - 15.5 % Final  . Platelets 09/19/2017 247  150 - 400 K/uL Final   Performed at [facility], [place] [address] [place], [place] [address]  . Sodium 09/19/2017 141  135 - 145 mmol/L Final  . Potassium 09/19/2017 4.8  3.5 - 5.1 mmol/L Final  . Chloride 09/19/2017 103  101 - 111 mmol/L Final  . CO2 09/19/2017 28  22 - 32 mmol/L Final  . Glucose, Bld  09/19/2017 102* 65 - 99 mg/dL Final  . BUN 09/19/2017 12  6 - 20 mg/dL Final  . Creatinine, Ser 09/19/2017 0.63  0.44 - 1.00 mg/dL Final  . Calcium 09/19/2017 9.7  8.9 - 10.3 mg/dL Final  . Total Protein 09/19/2017 7.7  6.5 - 8.1 g/dL Final  . Albumin 09/19/2017 4.0  3.5 - 5.0 g/dL Final  . AST 09/19/2017 25  15 - 41 U/L Final  . ALT 09/19/2017 15  14 - 54 U/L Final  . Alkaline Phosphatase 09/19/2017 62  38 - 126 U/L Final  . Total Bilirubin 09/19/2017 0.5  0.3 - 1.2 mg/dL Final  . GFR calc non Af Amer 09/19/2017 >60  >60 mL/min Final  . GFR calc Af Amer 09/19/2017 >60  >60 mL/min Final   Comment: (NOTE) The eGFR has been calculated using the CKD EPI equation. This calculation has not been validated in all clinical situations. eGFR's persistently <60 mL/min signify possible Chronic Kidney Disease.   Georgiann Hahn gap 09/19/2017 10  5 - 15 Final   Performed at [facility], [place] [address] [place], [place] [address]  . Prothrombin Time 09/19/2017 12.3  11.4 - 15.2 seconds Final  . INR 09/19/2017 0.93   Final   Performed at [facility], [place] [address], [place], [place] [address]  . ABO/RH(D) 09/19/2017 A POS   Final  . Antibody Screen 09/19/2017 NEG   Final  . Sample Expiration 09/19/2017 09/27/2017   Final  . Extend sample reason 09/19/2017    Final                   Value:NO TRANSFUSIONS OR PREGNANCY IN THE PAST 3 MONTHS Performed at [facility], [place] [address], [place], [place] [address]   . MRSA, PCR 09/19/2017 NEGATIVE  NEGATIVE Final  . Staphylococcus aureus 09/19/2017 POSITIVE* NEGATIVE Final   Comment: (NOTE) The Xpert SA Assay (FDA approved for NASAL specimens in patients 37 years of age and older), is one component of a comprehensive surveillance program. It is not intended to diagnose infection nor to guide or monitor treatment. Performed at [facility], [place] [address] [place], [place] [address]       X-Rays:Dg Pelvis Portable  Result Date: 09/24/2017 CLINICAL DATA:  Left hip arthroplasty EXAM: PORTABLE PELVIS 1-2 VIEWS COMPARISON:  Intra operative radiographs from the same day. FINDINGS: Uncemented left total hip arthroplasty is noted with postop subcutaneous periarticular emphysema. No hardware failure postoperative fracture. Overlying drainage device projects  over the left thigh. Degenerative joint space narrowing of the native right hip. IMPRESSION: No immediate postoperative complications identified status post left hip arthroplasty. Electronically Signed   By: Ashley Royalty M.D.   On: 09/24/2017 16:06   Dg C-arm 1-60 Min-no Report  Result Date: 09/24/2017 Fluoroscopy was utilized by the requesting physician.  No radiographic interpretation.    EKG: Orders placed or performed during the hospital encounter of 07/04/17  . ED EKG  . ED EKG  . EKG 12-Lead  . EKG 12-Lead  . EKG     Hospital Course: Patient was admitted to [facility] and taken to the OR and underwent the above state procedure without complications.  Patient tolerated the procedure well and was later transferred to the recovery room and then to the orthopaedic floor for postoperative care.  They were given PO and IV analgesics for pain control following their surgery.  They were given 24 hours of postoperative antibiotics of  Anti-infectives (From admission, onward)   Start     Dose/Rate Route Frequency Ordered Stop   09/24/17 1930  ceFAZolin (ANCEF) IVPB 2g/100 mL premix     2 g 200 mL/hr over 30 Minutes Intravenous Every 6 hours 09/24/17 1658 09/25/17 0214   09/24/17 1136  ceFAZolin (ANCEF) IVPB 2g/100 mL premix     2 g 200 mL/hr over 30 Minutes Intravenous On call to O.R. 09/24/17 1136 09/24/17 1400     and started on DVT prophylaxis in the form of Xarelto.   PT and OT were ordered for total hip protocol.  The patient was allowed to be WBAT with therapy. Discharge planning was consulted to help with  postop disposition and equipment needs.  Patient had a tough night on the evening of surgery.  They started to get up OOB with therapy on day one.  Hemovac drain was pulled without difficulty.  Continued to work with therapy into day two.  Dressing was changed on day two and the incision was healing well.   Patient was seen in rounds on day two  and was ready to go home.  Diet - Cardiac diet Follow up - in 2 weeks Activity - WBAT Disposition - Home Condition Upon Discharge - Good D/C Meds - See DC Summary DVT Prophylaxis - Xarelto     Discharge Instructions    Call MD / Call 911   Complete by:  As directed    If you experience chest pain or shortness of breath, CALL 911 and be transported to the hospital emergency room.  If you develope a fever above 101 F, pus (white drainage) or increased drainage or redness at the wound, or calf pain, call your surgeon's office.   Change dressing   Complete by:  As directed    You may change your dressing dressing daily with sterile 4 x 4 inch gauze dressing and paper tape.  Do not submerge the incision under water.   Constipation Prevention   Complete by:  As directed    Drink plenty of fluids.  Prune juice may be helpful.  You may use a stool softener, such as Colace (over the counter) 100 mg twice a day.  Use MiraLax (over the counter) for constipation as needed.   Diet - low sodium heart healthy   Complete by:  As directed    Discharge instructions   Complete by:  As directed    Take Xarelto for two and a half more weeks, then discontinue Xarelto. Once the patient  has completed the blood thinner regimen, then take a Baby 81 mg Aspirin daily for three more weeks.   Pick up stool softner and laxative for home use following surgery while on pain medications. Do not submerge incision under water. Please use good hand washing techniques while changing dressing each day. May shower starting three days after surgery. Please use a clean towel to pat  the incision dry following showers. Continue to use ice for pain and swelling after surgery. Do not use any lotions or creams on the incision until instructed by your surgeon.  Wear both TED hose on both legs during the day every day for three weeks, but may remove the TED hose at night at home.  Postoperative Constipation Protocol  Constipation - defined medically as fewer than three stools per week and severe constipation as less than one stool per week.  One of the most common issues patients have following surgery is constipation.  Even if you have a regular bowel pattern at home, your normal regimen is likely to be disrupted due to multiple reasons following surgery.  Combination of anesthesia, postoperative narcotics, change in appetite and fluid intake all can affect your bowels.  In order to avoid complications following surgery, here are some recommendations in order to help you during your recovery period.  Colace (docusate) - Pick up an over-the-counter form of Colace or another stool softener and take twice a day as long as you are requiring postoperative pain medications.  Take with a full glass of water daily.  If you experience loose stools or diarrhea, hold the colace until you stool forms back up.  If your symptoms do not get better within 1 week or if they get worse, check with your doctor.  Dulcolax (bisacodyl) - Pick up over-the-counter and take as directed by the product packaging as needed to assist with the movement of your bowels.  Take with a full glass of water.  Use this product as needed if not relieved by Colace only.   MiraLax (polyethylene glycol) - Pick up over-the-counter to have on hand.  MiraLax is a solution that will increase the amount of water in your bowels to assist with bowel movements.  Take as directed and can mix with a glass of water, juice, soda, coffee, or tea.  Take if you go more than two days without a movement. Do not use MiraLax more than once per  day. Call your doctor if you are still constipated or irregular after using this medication for 7 days in a row.  If you continue to have problems with postoperative constipation, please contact the office for further assistance and recommendations.  If you experience "the worst abdominal pain ever" or develop nausea or vomiting, please contact the office immediatly for further recommendations for treatment.   Do not sit on low chairs, stoools or toilet seats, as it may be difficult to get up from low surfaces   Complete by:  As directed    Driving restrictions   Complete by:  As directed    No driving until released by the physician.   Increase activity slowly as tolerated   Complete by:  As directed    Lifting restrictions   Complete by:  As directed    No lifting until released by the physician.   Patient may shower   Complete by:  As directed    You may shower without a dressing once there is no drainage.  Do not wash over the  wound.  If drainage remains, do not shower until drainage stops.   TED hose   Complete by:  As directed    Use stockings (TED hose) for 3 weeks on both leg(s).  You may remove them at night for sleeping.   Weight bearing as tolerated   Complete by:  As directed    Laterality:  left   Extremity:  Lower     Allergies as of 09/25/2017      Reactions   Gramineae Pollens    Statins Other (See Comments)   Causing liver damage       Medication List    STOP taking these medications   diclofenac sodium 1 % Gel Commonly known as:  VOLTAREN   HYDROcodone-acetaminophen 10-325 MG tablet Commonly known as:  NORCO     TAKE these medications   buPROPion 150 MG 24 hr tablet Commonly known as:  WELLBUTRIN XL Take 1 tablet (150 mg total) by mouth daily. What changed:  when to take this   lamoTRIgine 200 MG tablet Commonly known as:  LAMICTAL Take 1 tablet (200 mg total) by mouth daily. What changed:  when to take this   loratadine 10 MG tablet Commonly  known as:  CLARITIN Take 10 mg by mouth daily as needed for allergies.   methocarbamol 500 MG tablet Commonly known as:  ROBAXIN Take 1 tablet (500 mg total) by mouth every 6 (six) hours as needed for muscle spasms.   oxyCODONE 5 MG immediate release tablet Commonly known as:  Oxy IR/ROXICODONE Take 1-2 tablets (5-10 mg total) by mouth every 4 (four) hours as needed for moderate pain or severe pain.   rivaroxaban 10 MG Tabs tablet Commonly known as:  XARELTO Take 1 tablet (10 mg total) by mouth daily with breakfast. Take Xarelto for two and a half more weeks following discharge from the hospital, then discontinue Xarelto. Once the patient has completed the blood thinner regimen, then take a Baby 81 mg Aspirin daily for three more weeks. Start taking on:  09/26/2017   timolol 0.5 % ophthalmic solution Commonly known as:  TIMOPTIC Instill 1 drop in each eye once daily   travoprost (benzalkonium) 0.004 % ophthalmic solution Commonly known as:  TRAVATAN Place 1 drop into both eyes at bedtime.   venlafaxine XR 150 MG 24 hr capsule Commonly known as:  EFFEXOR-XR TAKE 1 CAPSULE(150 MG) BY MOUTH DAILY WITH BREAKFAST   zolpidem 5 MG tablet Commonly known as:  AMBIEN Take 1 tablet (5 mg total) by mouth at bedtime as needed for sleep.            Durable Medical Equipment  (From admission, onward)        Start     Ordered   09/25/17 1149  For home use only DME Walker rolling  Once    Question:  Patient needs a walker to treat with the following condition  Answer:  S/P hip hemiarthroplasty   09/25/17 1149       Discharge Care Instructions  (From admission, onward)        Start     Ordered   09/25/17 0000  Weight bearing as tolerated    Question Answer Comment  Laterality left   Extremity Lower      09/25/17 2109   09/25/17 0000  Change dressing    Comments:  You may change your dressing dressing daily with sterile 4 x 4 inch gauze dressing and paper tape.  Do not  submerge the  incision under water.   09/25/17 2109     Follow-up Information    [facility] Follow up.   Why:  walker Contact information: [address] [place] [address] [phone]        [facility], [facility] Follow up.   Specialty:  [place] Why:  physical therapy Contact information: [place] [address] [phone]        Gaynelle Arabian, MD. Schedule an appointment as soon as possible for a visit on 10/09/2017.   Specialty:  Orthopedic Surgery Contact information: [address] [place] [address] [phone]           Signed: Arlee Muslim, PA-C Orthopaedic Surgery 09/25/2017, 9:11 PM

## 2017-09-25 NOTE — Progress Notes (Addendum)
Discharge planning, spoke with patient at beside. [facility] PT to eval and treat. Contacted [facility] for referral. They do not take [facility]. Contacted [facility], they will accept. Needs a RW, contacted [facility] to deliver to room, declines 3-n-1. [phone]

## 2017-09-25 NOTE — Evaluation (Signed)
Physical Therapy Evaluation Patient Details Name: [name] [name] MRN: [id number] DOB: Jun 01, 1956 Today's Date: 09/25/2017   History of Present Illness  62 yo female s/p L THA-DA 09/24/17. Hx of bil TKA 2008, hearing impaired (bil hearing aids), OSA, obesity.   Clinical Impression  On eval, pt required Min assist for mobility. She walked ~75 feet with a RW. Pt has been experiencing significant pain with bed mobility. Will continue to progress activity as tolerated. Recommend HHPT f/u if surgeon approves it.     Follow Up Recommendations Follow surgeon's recommendation for DC plan and follow-up therapies( Home Health if surgeon approves it)    Equipment Recommendations  Rolling walker with 5" wheels(youth height)    Recommendations for Other Services       Precautions / Restrictions Precautions Precautions: Fall Restrictions Weight Bearing Restrictions: No LLE Weight Bearing: Weight bearing as tolerated      Mobility  Bed Mobility Overal bed mobility: Needs Assistance Bed Mobility: Supine to Sit     Supine to sit: Min assist;HOB elevated     General bed mobility comments: Assist for L LE. Increased time. Pt tearful and anxious. Max cueing and reassurance required to complete task.   Transfers Overall transfer level: Needs assistance Equipment used: Rolling walker (2 wheeled) Transfers: Sit to/from Stand Sit to Stand: Min assist         General transfer comment: VCs safety, technique, hand/LE placement. Assist to steady.   Ambulation/Gait Ambulation/Gait assistance: Min assist Ambulation Distance (Feet): 75 Feet Assistive device: Rolling walker (2 wheeled) Gait Pattern/deviations: Step-to pattern;Decreased step length - left;Antalgic     General Gait Details: Assist to steady. VCs safety, sequence. slow gait speed.   Stairs            Wheelchair Mobility    Modified Rankin (Stroke Patients Only)       Balance Overall balance assessment: Mild  deficits observed, not formally tested                                           Pertinent Vitals/Pain Pain Assessment: 0-10 Pain Score: 7  Pain Location: L hip, especially with abd/add Pain Descriptors / Indicators: Sharp;Burning Pain Intervention(s): Limited activity within patient's tolerance;Repositioned;Ice applied    Home Living Family/patient expects to be discharged to:: Private residence Living Arrangements: Spouse/significant other Available Help at Discharge: Family;Available PRN/intermittently Type of Home: House Home Access: Stairs to enter Entrance Stairs-Rails: Right Entrance Stairs-Number of Steps: 4 Home Layout: One level Home Equipment: Cane - single point      Prior Function                 Hand Dominance        Extremity/Trunk Assessment   Upper Extremity Assessment Upper Extremity Assessment: Overall WFL for tasks assessed    Lower Extremity Assessment Lower Extremity Assessment: Generalized weakness(s/p L THA)    Cervical / Trunk Assessment Cervical / Trunk Assessment: Normal  Communication      Cognition Arousal/Alertness: Awake/alert Behavior During Therapy: WFL for tasks assessed/performed Overall Cognitive Status: Within Functional Limits for tasks assessed                                        General Comments      Exercises Total Joint Exercises Ankle  Circles/Pumps: AROM;Both;10 reps;Supine Quad Sets: AROM;Both;10 reps;Supine Heel Slides: AAROM;Left;10 reps;Supine Hip ABduction/ADduction: AAROM;Left;10 reps;Supine(limited range due to pain)   Assessment/Plan    PT Assessment Patient needs continued PT services  PT Problem List Decreased strength;Decreased range of motion;Decreased balance;Decreased mobility;Decreased activity tolerance;Pain;Decreased knowledge of use of DME       PT Treatment Interventions DME instruction;Gait training;Functional mobility training;Therapeutic  activities;Balance training;Patient/family education;Therapeutic exercise;Stair training    PT Goals (Current goals can be found in the Care Plan section)  Acute Rehab PT Goals Patient Stated Goal: less pain. regain independence PT Goal Formulation: With patient Time For Goal Achievement: 10/09/17 Potential to Achieve Goals: Good    Frequency 7X/week   Barriers to discharge        Co-evaluation               AM-PAC PT "6 Clicks" Daily Activity  Outcome Measure Difficulty turning over in bed (including adjusting bedclothes, sheets and blankets)?: Unable Difficulty moving from lying on back to sitting on the side of the bed? : Unable Difficulty sitting down on and standing up from a chair with arms (e.g., wheelchair, bedside commode, etc,.)?: Unable Help needed moving to and from a bed to chair (including a wheelchair)?: A Little Help needed walking in hospital room?: A Little Help needed climbing 3-5 steps with a railing? : A Little 6 Click Score: 12    End of Session Equipment Utilized During Treatment: Gait belt Activity Tolerance: Patient limited by pain Patient left: in chair;with call bell/phone within reach   PT Visit Diagnosis: Pain;Difficulty in walking, not elsewhere classified (R26.2);Other abnormalities of gait and mobility (R26.89) Pain - Right/Left: Left Pain - part of body: Hip    Time: 1610-9604 PT Time Calculation (min) (ACUTE ONLY): 32 min   Charges:   PT Evaluation $PT Eval Low Complexity: 1 Low PT Treatments $Gait Training: 8-22 mins   PT G Codes:          Rebeca Alert, MPT Pager: [phone]

## 2017-09-25 NOTE — Progress Notes (Signed)
   Subjective: 1 Day Post-Op Procedure(s) (LRB): LEFT TOTAL HIP ARTHROPLASTY ANTERIOR APPROACH (Left) Patient reports pain as mild and moderate.   Patient seen in rounds with Dr. Lequita HaltAluisio. Patient is well, but has had some minor complaints of pain in the hip, requiring pain medications We started therapy today.  Plan is to go Home after hospital stay.  Objective: Vital signs in last 24 hours: Temp:  [98.1 F (36.7 C)-98.9 F (37.2 C)] 98.1 F (36.7 C) (02/19 1740) Pulse Rate:  [70-84] 70 (02/19 1740) Resp:  [16-18] 17 (02/19 1000) BP: (123-155)/(69-91) 125/69 (02/19 1740) SpO2:  [98 %-100 %] 99 % (02/19 1740)  Intake/Output from previous day:  Intake/Output Summary (Last 24 hours) at 09/25/2017 2104 Last data filed at 09/25/2017 1322 Gross per 24 hour  Intake 2140 ml  Output 3135 ml  Net -995 ml    Intake/Output this shift: No intake/output data recorded.  Labs: Recent Labs    09/25/17 0543  HGB 10.0*   Recent Labs    09/25/17 0543  WBC 10.2  RBC 3.07*  HCT 29.7*  PLT 287   Recent Labs    09/25/17 0543  NA 139  K 4.1  CL 102  CO2 27  BUN 11  CREATININE 0.61  GLUCOSE 127*  CALCIUM 9.0   No results for input(s): LABPT, INR in the last 72 hours.  EXAM General - Patient is Alert, Appropriate and Oriented Extremity - Neurovascular intact Sensation intact distally Intact pulses distally Dorsiflexion/Plantar flexion intact Dressing - dressing C/D/I Motor Function - intact, moving foot and toes well on exam.  Hemovac pulled without difficulty.  Past Medical History:  Diagnosis Date  . Anxiety   . Deafness    Wears Bilat hearing aids since Childhood  . Depression   . Environmental and seasonal allergies   . Glaucoma, both eyes   . Hyperlipidemia   . Mild cognitive impairment, so stated 07/16/2013   Patient subjectively complains of word finding and memory deficits. MOCA 30-30 on 07-16-13 , paraphrasic errors- uses words such as  closet for garage     . OA (osteoarthritis)    "all over"  . Obstructive sleep apnea treated with BiPAP   . Right arm weakness    upper due to shoulder issues  . Wears glasses   . Wears hearing aid in both ears     Assessment/Plan: 1 Day Post-Op Procedure(s) (LRB): LEFT TOTAL HIP ARTHROPLASTY ANTERIOR APPROACH (Left) Principal Problem:   OA (osteoarthritis) of hip  Estimated body mass index is 33.59 kg/m as calculated from the following:   Height as of this encounter: 5' (1.524 m).   Weight as of this encounter: 78 kg (172 lb). Advance diet Up with therapy  DVT Prophylaxis - Xarelto Weight Bearing As Tolerated left Leg Hemovac Pulled Begin Therapy  [name] Peacerew Perkins, PA-C Orthopaedic Surgery 09/25/2017, 9:04 PM

## 2017-09-25 NOTE — Plan of Care (Signed)
Plan of care reviewed and discussed with patient.   

## 2017-09-25 NOTE — Progress Notes (Signed)
Physical Therapy Treatment Patient Details Name: [name] [name] MRN: [id number] DOB: 05-16-1956 Today's Date: 09/25/2017    History of Present Illness 62 yo female s/p L THA-DA 09/24/17. Hx of bil TKA 2008, hearing impaired (bil hearing aids), OSA, obesity.     PT Comments    Progressing with mobility. Will continue to progress activity as tolerated.    Follow Up Recommendations  Follow surgeon's recommendation for DC plan and follow-up therapies     Equipment Recommendations  Rolling walker with 5" wheels    Recommendations for Other Services       Precautions / Restrictions Precautions Precautions: Fall Restrictions Weight Bearing Restrictions: No LLE Weight Bearing: Weight bearing as tolerated    Mobility  Bed Mobility Overal bed mobility: Needs Assistance Bed Mobility: Supine to Sit;Sit to Supine     Supine to sit: Min assist;HOB elevated Sit to supine: Min assist;HOB elevated   General bed mobility comments: Assist for L LE. Increased time. Improved performance and tolerance this session.   Transfers Overall transfer level: Needs assistance Equipment used: Rolling walker (2 wheeled) Transfers: Sit to/from Stand Sit to Stand: Min guard         General transfer comment: close guard for safety.   Ambulation/Gait Ambulation/Gait assistance: Min guard Ambulation Distance (Feet): 125 Feet Assistive device: Rolling walker (2 wheeled) Gait Pattern/deviations: Step-to pattern     General Gait Details: close guard for safety.    Stairs            Wheelchair Mobility    Modified Rankin (Stroke Patients Only)       Balance Overall balance assessment: Mild deficits observed, not formally tested                                          Cognition Arousal/Alertness: Awake/alert Behavior During Therapy: WFL for tasks assessed/performed Overall Cognitive Status: Within Functional Limits for tasks assessed                                         Exercises   General Comments        Pertinent Vitals/Pain Pain Assessment: 0-10 Pain Score: 7  Pain Location: L hip/thigh Pain Descriptors / Indicators: Sharp;Burning;Sore Pain Intervention(s): Monitored during session;Repositioned;Ice applied    Home Living                      Prior Function            PT Goals (current goals can now be found in the care plan section) Acute Rehab PT Goals Patient Stated Goal: less pain. regain independence PT Goal Formulation: With patient Time For Goal Achievement: 10/09/17 Potential to Achieve Goals: Good Progress towards PT goals: Progressing toward goals    Frequency    7X/week      PT Plan Current plan remains appropriate    Co-evaluation              AM-PAC PT "6 Clicks" Daily Activity  Outcome Measure  Difficulty turning over in bed (including adjusting bedclothes, sheets and blankets)?: Unable Difficulty moving from lying on back to sitting on the side of the bed? : Unable Difficulty sitting down on and standing up from a chair with arms (e.g., wheelchair, bedside commode, etc,.)?: Unable Help needed  moving to and from a bed to chair (including a wheelchair)?: A Little Help needed walking in hospital room?: A Little Help needed climbing 3-5 steps with a railing? : A Little 6 Click Score: 12    End of Session Equipment Utilized During Treatment: Gait belt Activity Tolerance: Patient tolerated treatment well Patient left: in bed;with call bell/phone within reach   PT Visit Diagnosis: Pain;Difficulty in walking, not elsewhere classified (R26.2);Other abnormalities of gait and mobility (R26.89) Pain - Right/Left: Left Pain - part of body: Hip     Time: [id number] PT Time Calculation (min) (ACUTE ONLY): 14 min  Charges:  $Gait Training: 8-22 mins                    G Codes:          [name] [name], MPT Pager: [phone]

## 2017-09-26 LAB — BASIC METABOLIC PANEL
Anion gap: 8 (ref 5–15)
BUN: 10 mg/dL (ref 6–20)
CHLORIDE: 105 mmol/L (ref 101–111)
CO2: 28 mmol/L (ref 22–32)
Calcium: 9 mg/dL (ref 8.9–10.3)
Creatinine, Ser: 0.46 mg/dL (ref 0.44–1.00)
GFR calc Af Amer: >60 mL/min (ref >60)
GFR calc non Af Amer: >60 mL/min (ref >60)
GLUCOSE: 103 mg/dL — AB (ref 65–99)
POTASSIUM: 3.8 mmol/L (ref 3.5–5.1)
SODIUM: 141 mmol/L (ref 135–145)

## 2017-09-26 LAB — CBC
HEMATOCRIT: 29.3 % — AB (ref 36.0–46.0)
HEMOGLOBIN: 9.7 g/dL — AB (ref 12.0–15.0)
MCH: 32 pg (ref 26.0–34.0)
MCHC: 33.1 g/dL (ref 30.0–36.0)
MCV: 96.7 fL (ref 78.0–100.0)
Platelets: 269 10*3/uL (ref 150–400)
RBC: 3.03 MIL/uL — AB (ref 3.87–5.11)
RDW: 12.1 % (ref 11.5–15.5)
WBC: 11.7 10*3/uL — ABNORMAL HIGH (ref 4.0–10.5)

## 2017-09-26 NOTE — Plan of Care (Signed)
Plan of care reviewed and discussed with patient.   

## 2017-09-26 NOTE — Progress Notes (Signed)
Physical Therapy Treatment Patient Details Name: [name] [name] MRN: [id number] DOB: [birth year] Today's Date: 09/26/2017    History of Present Illness 62 yo female s/p L THA-DA 09/24/17. Hx of bil TKA 2008, hearing impaired (bil hearing aids), OSA, obesity.     PT Comments    Progressing with mobility. Reviewed/practiced exercises, gait training, stair training. Verbally reviewed dressing and stepping in/out of walk in shower. Instructed pt to have assistance for dressing, showering for safety. All education completed. Okay to d/c from PT standpoint-made RN aware. Pt has had HHPT arranged.    Follow Up Recommendations  Follow surgeon's recommendation for DC plan and follow-up therapies     Equipment Recommendations  Rolling walker with 5" wheels    Recommendations for Other Services       Precautions / Restrictions Precautions Precautions: Fall Restrictions Weight Bearing Restrictions: No LLE Weight Bearing: Weight bearing as tolerated    Mobility  Bed Mobility Overal bed mobility: Needs Assistance Bed Mobility: Supine to Sit;Sit to Supine     Supine to sit: Min assist;HOB elevated Sit to supine: Min assist;HOB elevated   General bed mobility comments: Small amount of assist for L LE off bed. Pt hooked L LE with R foot to get L LE back onto bed.   Transfers Overall transfer level: Needs assistance Equipment used: Rolling walker (2 wheeled) Transfers: Sit to/from Stand Sit to Stand: Min guard         General transfer comment: close guard for safety.   Ambulation/Gait Ambulation/Gait assistance: Min guard Ambulation Distance (Feet): 150 Feet Assistive device: Rolling walker (2 wheeled) Gait Pattern/deviations: Step-to pattern     General Gait Details: close guard for safety.    Stairs Stairs: Yes Min Assist Stair Management: One rail Left;Step to pattern;Sideways Number of Stairs: 5 General stair comments: up and over portable steps x 2. VCs safety,  technique, sequence. Practiced x 2 with 2 hands on 1 handrail. Also discussed using 1 rail, 1 cane. Pt stated she would continue with using 1 handrail until she gets a chance to practice with HHPT.   Wheelchair Mobility    Modified Rankin (Stroke Patients Only)       Balance                                            Cognition Arousal/Alertness: Awake/[name] Behavior During Therapy: WFL for tasks assessed/performed Overall Cognitive Status: Within Functional Limits for tasks assessed                                        Exercises Total Joint Exercises Ankle Circles/Pumps: AROM;Both;10 reps;Supine Quad Sets: AROM;Both;10 reps;Supine Heel Slides: AAROM;Left;10 reps;Supine (pt practiced using sheet to assist) Hip ABduction/ADduction: AAROM;Left;10 reps;Supine    General Comments        Pertinent Vitals/Pain Pain Assessment: 0-10 Pain Score: 7  Pain Location: L hip/thigh Pain Descriptors / Indicators: Sharp;Burning;Sore Pain Intervention(s): Monitored during session;Repositioned    Home Living                      Prior Function            PT Goals (current goals can now be found in the care plan section) Progress towards PT goals: Progressing toward goals  Frequency    7X/week      PT Plan Current plan remains appropriate    Co-evaluation              AM-PAC PT "6 Clicks" Daily Activity  Outcome Measure  Difficulty turning over in bed (including adjusting bedclothes, sheets and blankets)?: Unable Difficulty moving from lying on back to sitting on the side of the bed? : Unable Difficulty sitting down on and standing up from a chair with arms (e.g., wheelchair, bedside commode, etc,.)?: Unable Help needed moving to and from a bed to chair (including a wheelchair)?: A Little Help needed walking in hospital room?: A Little Help needed climbing 3-5 steps with a railing? : A Little 6 Click Score: 12     End of Session Equipment Utilized During Treatment: Gait belt Activity Tolerance: Patient tolerated treatment well Patient left: in bed;with call bell/phone within reach   PT Visit Diagnosis: Pain;Difficulty in walking, not elsewhere classified (R26.2);Other abnormalities of gait and mobility (R26.89) Pain - Right/Left: Left Pain - part of body: Hip     Time: 1610-9604 PT Time Calculation (min) (ACUTE ONLY): 36 min  Charges:  $Gait Training: 8-22 mins $Therapeutic Exercise: 8-22 mins                    G Codes:         [name] [name], MPT Pager: [phone]

## 2017-09-26 NOTE — Progress Notes (Signed)
   Subjective: 2 Days Post-Op Procedure(s) (LRB): LEFT TOTAL HIP ARTHROPLASTY ANTERIOR APPROACH (Left) Patient reports pain as mild.   Patient seen in rounds with Dr. Lequita HaltAluisio. Patient is well, but has had some minor complaints of pain in the hip, requiring pain medications Patient is ready to go home  Objective: Vital signs in last 24 hours: Temp:  [98.1 F (36.7 C)-98.7 F (37.1 C)] 98.5 F (36.9 C) (02/20 0612) Pulse Rate:  [70-83] 83 (02/20 0612) Resp:  [17-18] 18 (02/20 0612) BP: (113-156)/(69-82) 156/82 (02/20 0612) SpO2:  [98 %-99 %] 98 % (02/20 0612)  Intake/Output from previous day:  Intake/Output Summary (Last 24 hours) at 09/26/2017 0807 Last data filed at 09/26/2017 0612 Gross per 24 hour  Intake 1672.5 ml  Output 600 ml  Net 1072.5 ml    Intake/Output this shift: No intake/output data recorded.  Labs: Recent Labs    09/25/17 0543 09/26/17 0548  HGB 10.0* 9.7*   Recent Labs    09/25/17 0543 09/26/17 0548  WBC 10.2 11.7*  RBC 3.07* 3.03*  HCT 29.7* 29.3*  PLT 287 269   Recent Labs    09/25/17 0543 09/26/17 0548  NA 139 141  K 4.1 3.8  CL 102 105  CO2 27 28  BUN 11 10  CREATININE 0.61 0.46  GLUCOSE 127* 103*  CALCIUM 9.0 9.0   No results for input(s): LABPT, INR in the last 72 hours.  EXAM: General - Patient is Alert and Appropriate Extremity - Neurovascular intact Sensation intact distally Intact pulses distally Dorsiflexion/Plantar flexion intact Incision - clean, dry Motor Function - intact, moving foot and toes well on exam.   Assessment/Plan: 2 Days Post-Op Procedure(s) (LRB): LEFT TOTAL HIP ARTHROPLASTY ANTERIOR APPROACH (Left) Procedure(s) (LRB): LEFT TOTAL HIP ARTHROPLASTY ANTERIOR APPROACH (Left) Past Medical History:  Diagnosis Date  . Anxiety   . Deafness    Wears Bilat hearing aids since Childhood  . Depression   . Environmental and seasonal allergies   . Glaucoma, both eyes   . Hyperlipidemia   . Mild cognitive  impairment, so stated 07/16/2013   Patient subjectively complains of word finding and memory deficits. MOCA 30-30 on 07-16-13 , paraphrasic errors- uses words such as  closet for garage   . OA (osteoarthritis)    "all over"  . Obstructive sleep apnea treated with BiPAP   . Right arm weakness    upper due to shoulder issues  . Wears glasses   . Wears hearing aid in both ears    Principal Problem:   OA (osteoarthritis) of hip  Estimated body mass index is 33.59 kg/m as calculated from the following:   Height as of this encounter: 5' (1.524 m).   Weight as of this encounter: 78 kg (172 lb). Up with therapy Diet - Cardiac diet Follow up - in 2 weeks Activity - WBAT Disposition - Home Condition Upon Discharge - Good D/C Meds - See DC Summary DVT Prophylaxis - Xarelto  Avel Peacerew Sirus Labrie, PA-C Orthopaedic Surgery 09/26/2017, 8:07 AM

## 2017-10-22 ENCOUNTER — Ambulatory Visit (INDEPENDENT_AMBULATORY_CARE_PROVIDER_SITE_OTHER): Payer: PRIVATE HEALTH INSURANCE | Admitting: Psychiatry

## 2017-10-22 ENCOUNTER — Encounter (HOSPITAL_COMMUNITY): Payer: Self-pay | Admitting: Psychiatry

## 2017-10-22 DIAGNOSIS — F330 Major depressive disorder, recurrent, mild: Secondary | ICD-10-CM

## 2017-10-22 DIAGNOSIS — Z811 Family history of alcohol abuse and dependence: Secondary | ICD-10-CM | POA: Diagnosis not present

## 2017-10-22 DIAGNOSIS — Z87891 Personal history of nicotine dependence: Secondary | ICD-10-CM

## 2017-10-22 DIAGNOSIS — F419 Anxiety disorder, unspecified: Secondary | ICD-10-CM

## 2017-10-22 DIAGNOSIS — Z818 Family history of other mental and behavioral disorders: Secondary | ICD-10-CM | POA: Diagnosis not present

## 2017-10-22 MED ORDER — LAMOTRIGINE 200 MG PO TABS: 200.0000 mg | ORAL_TABLET | Freq: Every day | ORAL | 0 refills | Status: DC

## 2017-10-22 MED ORDER — ZOLPIDEM TARTRATE 5 MG PO TABS: 5.0000 mg | ORAL_TABLET | Freq: Every evening | ORAL | 1 refills | Status: DC | PRN

## 2017-10-22 MED ORDER — VENLAFAXINE HCL ER 150 MG PO CP24: ORAL_CAPSULE | ORAL | 0 refills | Status: DC

## 2017-10-22 MED ORDER — BUPROPION HCL ER (XL) 150 MG PO TB24: 150.0000 mg | ORAL_TABLET | Freq: Every morning | ORAL | 0 refills | Status: DC

## 2017-10-22 NOTE — Progress Notes (Signed)
[place] MD/PA/NP OP Progress Note  10/22/2017 10:19 AM [name] [name]  MRN:  [id number]  Chief Complaint: I have left hip surgery month ago.  I am doing much better.  HPI: Patient came for her follow-up appointment.  He had left hip surgery done by Dr. Reynaldo Minium 4 weeks ago.  She is doing much better.  Her pain is also improved.  She noticed her relationship with her partner [name] [name] is also improved.  Patient told [name] [name] is taking her to the doctor's appointment and she cannot believe that her perception is totally change.  She even had a date night which went very well.  Patient overall feeling less depressed less anxious and denies any recent crying spells or any feeling of hopelessness or worthlessness.  Patient and her partner also started counseling with a therapist [name] [name] and so far they have 3 sessions.  Patient is no longer taking narcotic pain medication.  She still have some difficulty walking but overall her pain level is much improved.  She is thinking to have right hip surgery in coming months.  She is concerned about her elderly parents as mother started to have balance issue and father has dementia.  She tried to see them once a week at least.  Patient apply for disability but she still feel if she get better after having right hip surgery may consider for part-time job.  Patient denies drinking alcohol or using any illegal substances.  Some nights she has difficulty sleeping and requesting Ambien 5 mg to take it as needed for insomnia.  She is not taking any muscle relaxant and narcotic pain medication.  Her energy level is good.  He denies any paranoia, hallucination or any suicidal thoughts.  Visit Diagnosis:    ICD-10-CM   1. Major depressive disorder, recurrent episode, mild (HCC) F33.0 venlafaxine XR (EFFEXOR-XR) 150 MG 24 hr capsule    lamoTRIgine (LAMICTAL) 200 MG tablet    buPROPion (WELLBUTRIN XL) 150 MG 24 hr tablet    zolpidem (AMBIEN) 5 MG tablet    Past Psychiatric History:  Reviewed. Patient has hospitalization in 2010 when her brother committed suicide. She was feeling very depressed and admitted at behavioral [place]. She has been taking antidepressants since 2008. She had tried Prozac and Paxil. She remember Prozac make her more manic and Paxil did not help. She also tried Abilify which caused headaches and diarrhea. Patient denies any history of suicidal attempt, hallucination, psychosis or mania.  Past Medical History:  Past Medical History:  Diagnosis Date  . Anxiety   . Deafness    Wears Bilat hearing aids since Childhood  . Depression   . Environmental and seasonal allergies   . Glaucoma, both eyes   . Hyperlipidemia   . Mild cognitive impairment, so stated 07/16/2013   Patient subjectively complains of word finding and memory deficits. MOCA 30-30 on 07-16-13 , paraphrasic errors- uses words such as  closet for garage   . OA (osteoarthritis)    "all over"  . Obstructive sleep apnea treated with BiPAP   . Right arm weakness    upper due to shoulder issues  . Wears glasses   . Wears hearing aid in both ears     Past Surgical History:  Procedure Laterality Date  . KNEE ARTHROSCOPY W/ SYNOVECTOMY Left 07-20-2010  dr Wynelle Link  [facility]  . LAPAROSCOPIC VAGINAL HYSTERECTOMY WITH SALPINGO OOPHORECTOMY Bilateral 09-25-2005   dr Julien Girt  [facility]  . [place]  12-21-2008   dr  [place]  [facility]  . TOTAL HIP ARTHROPLASTY Left 09/24/2017   Procedure: LEFT TOTAL HIP ARTHROPLASTY ANTERIOR APPROACH;  Surgeon: Gaynelle Arabian, MD;  Location: [facility];  Service: Orthopedics;  Laterality: Left;  . TOTAL KNEE ARTHROPLASTY Left 11/21/2013   Procedure: LEFT TOTAL KNEE  ARTHROPLASTY REVISION;  Surgeon: Gearlean Alf, MD;  Location: [facility];  Service: Orthopedics;  Laterality: Left;  . TOTAL KNEE ARTHROPLASTY Bilateral 06-11-2006   dr Wynelle Link  [facility]    Family Psychiatric History: Reviewed.  Family History:  Family History  Problem Relation Age of Onset   . Depression Father   . Depression Paternal Grandmother   . Depression Mother   . Alcohol abuse Mother   . Depression Brother   . Alcohol abuse Brother     Social History:  Social History   Socioeconomic History  . Marital status: Married    Spouse name: Not on file  . Number of children: 0  . Years of education: Not on file  . Highest education level: Not on file  Social Needs  . Financial resource strain: Not on file  . Food insecurity - worry: Not on file  . Food insecurity - inability: Not on file  . Transportation needs - medical: Not on file  . Transportation needs - non-medical: Not on file  Occupational History    Employer: [employer]  Tobacco Use  . Smoking status: Former Smoker    Years: 12.00    Types: Cigarettes    Last attempt to quit: 11/18/1988    Years since quitting: 28.9  . Smokeless tobacco: Never Used  Substance and Sexual Activity  . Alcohol use: Yes    Comment: occ  . Drug use: No  . Sexual activity: Yes    Partners: Female    Birth control/protection: None  Other Topics Concern  . Not on file  Social History Narrative   Works full-time @ [employer]    Has a cat,3 dogs    lives with Partner(Female)[name]    Allergies:  Allergies  Allergen Reactions  . Gramineae Pollens   . Statins Other (See Comments)    Causing liver damage     Metabolic Disorder Labs: Recent Results (from the past 2160 hour(s))  APTT     Status: None   Collection Time: 09/19/17 11:31 AM  Result Value Ref Range   aPTT 28 24 - 36 seconds    Comment: Performed at [facility], [place] [address], [place], [place] [address]  CBC     Status: Abnormal   Collection Time: 09/19/17 11:31 AM  Result Value Ref Range   WBC 6.0 4.0 - 10.5 K/uL   RBC 3.73 (L) 3.87 - 5.11 MIL/uL   Hemoglobin 11.9 (L) 12.0 - 15.0 g/dL   HCT 36.6 36.0 - 46.0 %   MCV 98.1 78.0 - 100.0 fL   MCH 31.9 26.0 - 34.0 pg   MCHC 32.5 30.0 - 36.0 g/dL   RDW 12.1 11.5 - 15.5 %    Platelets 247 150 - 400 K/uL    Comment: Performed at [facility], [place] [address], [place], [place] [address]  Comprehensive metabolic panel     Status: Abnormal   Collection Time: 09/19/17 11:31 AM  Result Value Ref Range   Sodium 141 135 - 145 mmol/L   Potassium 4.8 3.5 - 5.1 mmol/L   Chloride 103 101 - 111 mmol/L   CO2 28 22 - 32 mmol/L   Glucose, Bld 102 (  H) 65 - 99 mg/dL   BUN 12 6 - 20 mg/dL   Creatinine, Ser 0.63 0.44 - 1.00 mg/dL   Calcium 9.7 8.9 - 10.3 mg/dL   Total Protein 7.7 6.5 - 8.1 g/dL   Albumin 4.0 3.5 - 5.0 g/dL   AST 25 15 - 41 U/L   ALT 15 14 - 54 U/L   Alkaline Phosphatase 62 38 - 126 U/L   Total Bilirubin 0.5 0.3 - 1.2 mg/dL   GFR calc non Af Amer >60 >60 mL/min   GFR calc Af Amer >60 >60 mL/min    Comment: (NOTE) The eGFR has been calculated using the CKD EPI equation. This calculation has not been validated in all clinical situations. eGFR's persistently <60 mL/min signify possible Chronic Kidney Disease.    Anion gap 10 5 - 15    Comment: Performed at [facility], [place] [address], [place], [place] [address]  Protime-INR     Status: None   Collection Time: 09/19/17 11:31 AM  Result Value Ref Range   Prothrombin Time 12.3 11.4 - 15.2 seconds   INR 0.93     Comment: Performed at [facility], [place] [address] [place], [place] [address]  Type and screen Order type and screen if day of surgery is less than 15 days from draw of preadmission visit or order morning of surgery if day of surgery is greater than 6 days from preadmission visit.     Status: None   Collection Time: 09/19/17 11:31 AM  Result Value Ref Range   ABO/RH(D) A POS    Antibody Screen NEG    Sample Expiration 09/27/2017    Extend sample reason      NO TRANSFUSIONS OR PREGNANCY IN THE PAST 3 MONTHS Performed at [facility], [place] [address], [place], [place] [address]   Surgical pcr screen     Status: Abnormal    Collection Time: 09/19/17 12:29 PM  Result Value Ref Range   MRSA, PCR NEGATIVE NEGATIVE   Staphylococcus aureus POSITIVE (A) NEGATIVE    Comment: (NOTE) The Xpert SA Assay (FDA approved for NASAL specimens in patients 4 years of age and older), is one component of a comprehensive surveillance program. It is not intended to diagnose infection nor to guide or monitor treatment. Performed at [facility], [place] [address], [place], [place] [address]   CBC     Status: Abnormal   Collection Time: 09/25/17  5:43 AM  Result Value Ref Range   WBC 10.2 4.0 - 10.5 K/uL   RBC 3.07 (L) 3.87 - 5.11 MIL/uL   Hemoglobin 10.0 (L) 12.0 - 15.0 g/dL   HCT 29.7 (L) 36.0 - 46.0 %   MCV 96.7 78.0 - 100.0 fL   MCH 32.6 26.0 - 34.0 pg   MCHC 33.7 30.0 - 36.0 g/dL   RDW 12.0 11.5 - 15.5 %   Platelets 287 150 - 400 K/uL    Comment: Performed at [facility], [place] [address], [place], [place] [address]  Basic metabolic panel     Status: Abnormal   Collection Time: 09/25/17  5:43 AM  Result Value Ref Range   Sodium 139 135 - 145 mmol/L   Potassium 4.1 3.5 - 5.1 mmol/L   Chloride 102 101 - 111 mmol/L   CO2 27 22 - 32 mmol/L   Glucose, Bld 127 (H) 65 - 99 mg/dL   BUN 11 6 - 20 mg/dL  Creatinine, Ser 0.61 0.44 - 1.00 mg/dL   Calcium 9.0 8.9 - 10.3 mg/dL   GFR calc non Af Amer >60 >60 mL/min   GFR calc Af Amer >60 >60 mL/min    Comment: (NOTE) The eGFR has been calculated using the CKD EPI equation. This calculation has not been validated in all clinical situations. eGFR's persistently <60 mL/min signify possible Chronic Kidney Disease.    Anion gap 10 5 - 15    Comment: Performed at [facility], [place] [address] [place], [place] [address]  CBC     Status: Abnormal   Collection Time: 09/26/17  5:48 AM  Result Value Ref Range   WBC 11.7 (H) 4.0 - 10.5 K/uL   RBC 3.03 (L) 3.87 - 5.11 MIL/uL   Hemoglobin 9.7 (L) 12.0 - 15.0 g/dL   HCT 29.3 (L)  36.0 - 46.0 %   MCV 96.7 78.0 - 100.0 fL   MCH 32.0 26.0 - 34.0 pg   MCHC 33.1 30.0 - 36.0 g/dL   RDW 12.1 11.5 - 15.5 %   Platelets 269 150 - 400 K/uL    Comment: Performed at [facility], [place] [address] [place], [place] [address]  Basic metabolic panel     Status: Abnormal   Collection Time: 09/26/17  5:48 AM  Result Value Ref Range   Sodium 141 135 - 145 mmol/L   Potassium 3.8 3.5 - 5.1 mmol/L   Chloride 105 101 - 111 mmol/L   CO2 28 22 - 32 mmol/L   Glucose, Bld 103 (H) 65 - 99 mg/dL   BUN 10 6 - 20 mg/dL   Creatinine, Ser 0.46 0.44 - 1.00 mg/dL   Calcium 9.0 8.9 - 10.3 mg/dL   GFR calc non Af Amer >60 >60 mL/min   GFR calc Af Amer >60 >60 mL/min    Comment: (NOTE) The eGFR has been calculated using the CKD EPI equation. This calculation has not been validated in all clinical situations. eGFR's persistently <60 mL/min signify possible Chronic Kidney Disease.    Anion gap 8 5 - 15    Comment: Performed at [facility], [place] [address], [place], [place] [address]   Lab Results  Component Value Date   HGBA1C 5.8 (H) 07/16/2013   No results found for: PROLACTIN No results found for: CHOL, TRIG, HDL, CHOLHDL, VLDL, LDLCALC Lab Results  Component Value Date   TSH 4.160 07/16/2013    Therapeutic Level Labs: No results found for: LITHIUM No results found for: VALPROATE No components found for:  CBMZ  Current Medications: Current Outpatient Medications  Medication Sig Dispense Refill  . buPROPion (WELLBUTRIN XL) 150 MG 24 hr tablet Take 1 tablet (150 mg total) by mouth daily. (Patient taking differently: Take 150 mg by mouth every morning. ) 90 tablet 0  . lamoTRIgine (LAMICTAL) 200 MG tablet Take 1 tablet (200 mg total) by mouth daily. (Patient taking differently: Take 200 mg by mouth every morning. ) 90 tablet 0  . loratadine (CLARITIN) 10 MG tablet Take 10 mg by mouth daily as needed for allergies.    . methocarbamol (ROBAXIN)  500 MG tablet Take 1 tablet (500 mg total) by mouth every 6 (six) hours as needed for muscle spasms. 80 tablet 0  . oxyCODONE (OXY IR/ROXICODONE) 5 MG immediate release tablet Take 1-2 tablets (5-10 mg total) by mouth every 4 (four) hours as needed for moderate pain or severe pain. 80 tablet 0  . rivaroxaban (XARELTO)  10 MG TABS tablet Take 1 tablet (10 mg total) by mouth daily with breakfast. Take Xarelto for two and a half more weeks following discharge from the hospital, then discontinue Xarelto. Once the patient has completed the blood thinner regimen, then take a Baby 81 mg Aspirin daily for three more weeks. 19 tablet 0  . timolol (TIMOPTIC) 0.5 % ophthalmic solution Instill 1 drop in each eye once daily  3  . travoprost, benzalkonium, (TRAVATAN) 0.004 % ophthalmic solution Place 1 drop into both eyes at bedtime.     Marland Kitchen venlafaxine XR (EFFEXOR-XR) 150 MG 24 hr capsule TAKE 1 CAPSULE(150 MG) BY MOUTH DAILY WITH BREAKFAST 90 capsule 0  . zolpidem (AMBIEN) 5 MG tablet Take 1 tablet (5 mg total) by mouth at bedtime as needed for sleep. 15 tablet 1   No current facility-administered medications for this visit.      Musculoskeletal: Strength & Muscle Tone: within normal limits Gait & Station: There is still difficulty walking because of pain but improved from the past Patient leans: Right  Psychiatric Specialty Exam: Review of Systems  Musculoskeletal: Positive for joint pain.  Skin: Negative.  Negative for itching and rash.    Blood pressure 134/82, pulse 82, height 5' (1.524 m), weight 179 lb (81.2 kg), SpO2 97 %.There is no height or weight on file to calculate BMI.  General Appearance: Casual  Eye Contact:  Good  Speech:  Clear and Coherent  Volume:  Normal  Mood:  Euthymic  Affect:  Appropriate  Thought Process:  Goal Directed  Orientation:  Full (Time, Place, and Person)  Thought Content: Logical   Suicidal Thoughts:  No  Homicidal Thoughts:  No  Memory:  Immediate;    Good Recent;   Good Remote;   Good  Judgement:  Good  Insight:  Good  Psychomotor Activity:  Normal  Concentration:  Concentration: Good and Attention Span: Good  Recall:  Good  Fund of Knowledge: Good  Language: Good  Akathisia:  No  Handed:  Right  AIMS (if indicated): not done  Assets:  Communication Skills Desire for Improvement Housing Resilience Social Support  ADL's:  Intact  Cognition: WNL  Sleep:  Good   Screenings:   Assessment and Plan: Major depressive disorder, recurrent.  Anxiety disorder NOS.  Patient doing much better since she had left hip surgery.  Reviewed blood work results and collect information from other providers.  Her relationship with the partner is also improved.  She started couple counseling with her.  Patient has no side effects with the medication.  She has no rash, itching tremors or shakes.  Continue Lamictal 200 mg daily, Wellbutrin XL 150 mg daily and Effexor 150 mg daily.  I would also provide Ambien 5 mg to take it as needed for insomnia however discussed interaction with pain medication.  Patient stopped taking narcotic pain medication and muscle relaxant.  Recommended to call us back if she has any question, concern if you feel worsening of the side effects.  Follow-up in 3 months.   Kathlee Nations, MD 10/22/2017, 10:19 AM

## 2017-11-08 ENCOUNTER — Ambulatory Visit: Payer: Self-pay | Admitting: Orthopedic Surgery

## 2017-11-23 ENCOUNTER — Ambulatory Visit: Payer: Self-pay | Admitting: Orthopedic Surgery

## 2017-11-23 NOTE — H&P (Signed)
[name] [name] [phone], F)  DOB 1956-04-29   Chief Complaint Right Hip Pain H&P right THA on 12/19/2017 at [facility]  Patient's Pharmacies [facility] [address] [facility]): [address], [place] [address], Ph [phone], Fax [phone]   Vitals Ht: 5 ft  Wt: 168 lbs  BMI: 32.8  BP: 120/68  Pulse: 84 bpm    Allergies Reviewed Allergies STATINS-HMG-COA REDUCTASE INHIBITORS    Medications Reviewed Medications buPROPion HCl XL 150 mg 24 hr tablet, extended release 150 mg. 11/19/17   filled surescripts HYDROCODONE/ACETAMINOPHEN 10-325 MG TABS 10/22/17   filled [facility] [employer] [facility] lamoTRIgine 200 mg tablet 200 mg. 11/14/17   filled surescripts timolol 0.5 % eye drops start 06/21/2015 06/21/15   filled [name] [name] timolol maleate 0.5 % eye drops INT 1 GTT IN [facility] EYE D IN THE MORNING 09/24/17   filled surescripts TRAVATAN Z 0.004 % SOLN 09/20/17   filled [employer] venlafaxine ER 150 mg capsule,extended release 24 hr TK 1 C PO DAILY WITH BREAKFAST 11/14/17   filled surescripts zolpidem 5 mg tablet TK 1 T PO QHS PRF SLEEP 11/14/17   filled surescripts              Problems Reviewed Problems Body mass index 30+ - obesity - Onset: 07/15/2015 Morbid obesity - Onset: 07/16/2013 Mild cognitive disorder - Onset: 07/16/2013 Anxiety state - Onset: 10/04/2006 Anxiety - Onset: 07/16/2013 Depressive disorder - Onset: 10/04/2006 Chronic pain syndrome - Onset: 08/10/2016 Hearing loss - Onset: 09/04/2012 Chronic sinusitis - Onset: 10/04/2006 Osteoarthritis - Onset: 10/04/2006 Osteoarthritis of hip - Onset: 09/24/2017 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 -  Osteoarthritis of left knee joint -    Family History Reviewed Family History Paternal Grandfather - Heart disease   - Congestive heart failure Mother - Osteoarthritis   - Diabetes mellitus Maternal Grandmother - Osteoarthritis   - Chronic  obstructive lung disease Father - Diabetes mellitus   Social History Reviewed Social History Smoking Status: Former smoker Tobacco-years of use: 15 Alcohol intake: Occasional Hand Dominance: Right Are on Hormone Therapy?: N Caffeine: Y Advance directive: Y Medical Power of Attorney: Y Live alone or with others?: with others   Surgical History Reviewed Surgical History Total replacement of left hip joint Hysterectomy - 09/2005 Procedure on foot - Rigth Foot bil total knee arthroplasty  lt. total knee revision   Past Medical History Reviewed Past Medical History High Cholesterol: Y Notes: Impaired Memory,  Impaired Vision,  Heart Murmur,  Anxiety,  Depression   HPI The patient is here today for a pre-operative History and Physical. They are scheduled for right total hip replacement on 12/19/2017 with Dr. Lequita Halt at [facility]. [name] [name] has had pain for over a year but has become severe in the past several months. It is hurting at all times including day and night. It is unrelenting and she cannot get comfortable. She had recent x-rays and was told she had arthritis or avascular necrosis. Both hips hurt but the left was the worst of the two. She has recently underwent the left hip replacement and has recovered very well. She does have pain in her RIGHT hip still which continues to affect how she walks. Radiographs, AP and lateral of both knees show both prostheses in excellent position with no periprosthetic abnormalities. Hip x-rays that she had elsewhere showed and she has severe end-stage arthritis of the LEFT worse than RIGHT hip (she has since had the left hip replaced).  The RIGHT hip side shows bone-on-bone without erosion. The patient has significant pain and dysfunction in their hip. They have significant functional limitations and have failed nonoperative management. At this point the most predictable means of improving pain and function is total hip arthroplasty.  We discussed the procedure risks, potential complication and rehab course in detail. At this time the patient would like to go ahead and proceed with the total hip arthroplasty. Risks and benefits have been discussed with the patient and she elects to proceed with surgery at this time. She has done well with the left hip and now wishes to proceed with the right total hip.   ROS Constitutional: Constitutional: no fever, chills, night sweats, or significant weight loss.  Cardiovascular: Cardiovascular: no palpitations or chest pain.  Respiratory: Respiratory: no cough or shortness of breath and No COPD.  Gastrointestinal: Gastrointestinal: no vomiting or nausea.  Musculoskeletal: Musculoskeletal: no swelling in Joints and Joint Pain.  Neurologic: Neurologic: no numbness, tingling, or difficulty with balance.   Physical Exam Patient is a 62 year old female.  General Mental Status - Alert, cooperative and good historian. General Appearance - pleasant, Not in acute distress. Orientation - Oriented X3. Build & Nutrition - Well nourished and Well developed.  Head and Neck Head - normocephalic, atraumatic . Neck Global Assessment - supple, no bruit auscultated on the right, no bruit auscultated on the left.  Eye Pupil - Bilateral - PERR Motion - Bilateral - EOMI.  Chest and Lung Exam Auscultation Breath sounds - clear at anterior chest wall and clear at posterior chest wall. Adventitious sounds - No Adventitious sounds.  Cardiovascular Auscultation Rhythm - Regular rate and rhythm. Heart Sounds - S1 WNL and S2 WNL. Murmurs & Other Heart Sounds - Auscultation of the heart reveals - No Murmurs.  Abdomen Palpation/Percussion Tenderness - Abdomen is non-tender to palpation. Abdomen is soft. Auscultation Auscultation of the abdomen reveals - Bowel sounds normal.  Genitourinary Note: Not done, not pertinent to present illness  Musculoskeletal RIGHT hip can be flexed to 100 with  minimal internal rotation about 10 of external rotation 10 of abduction. Both knees show no swelling. Range is about 0-125. There is no instability about either knee in no specific tenderness about either knee.  Radiographs- AP pelvis, AP and lateral of the bilateral hips dated 10/30/2017 demonstrate a prosthesis in excellent position with no periprosthetic abnormalities on the left. On the right, she has bone-on-bone arthritis with subchondral cystic formation.   Assessment / Plan 1. Osteoarthritis of hip M16.11: Unilateral primary osteoarthritis, right hip  Goals Patient Instructions Surgical Plans: Right Total Hip Replacement - Anterior Approach Disposition: Home, HHPT (will bring informaiton to hospital) PCP: Dr. Everlene OtherBouska IV TXA Anesthesia Issues: None Patient was instructed on what medications to stop prior to surgery. - Follow up visit in 2 weeks with Dr. Lequita HaltAluisio - Begin physical therapy following surgery - Pre-operative lab work as pre Pre-Surgical Testing - Prescriptions will be provided in hospital at time of discharge  Return to Office Ollen GrossFrank Aluisio, MD for [facility] on 01/01/2018 at 01:45 PM  Encounter signed-off by Patrica DuelALEXZANDREW PERKINS, PA-C

## 2017-11-23 NOTE — H&P (Signed)
[name] [name], [name] [name] (62yo, F)  DOB 07/15/1956   Chief Complaint Right Hip Pain H&P right THA on 12/19/2017 at [facility]  Patient's Pharmacies [facility] ([facility]): [address], [place] [address], Ph [phone], Fax [phone]   Vitals Ht: 5 ft  Wt: 168 lbs  BMI: 32.8  BP: 120/68  Pulse: 84 bpm    Allergies Reviewed Allergies STATINS-HMG-COA REDUCTASE INHIBITORS    Medications Reviewed Medications buPROPion HCl XL 150 mg 24 hr tablet, extended release 150 mg. 11/19/17   filled surescripts HYDROCODONE/ACETAMINOPHEN 10-325 MG TABS 10/22/17   filled [facility] [employer] [facility] lamoTRIgine 200 mg tablet 200 mg. 11/14/17   filled surescripts timolol 0.5 % eye drops start 06/21/2015 06/21/15   filled [name] timolol maleate 0.5 % eye drops INT 1 GTT IN EACH EYE D IN THE MORNING 09/24/17   filled surescripts TRAVATAN Z 0.004 % SOLN 09/20/17   filled [facility] [employer] [facility] venlafaxine ER 150 mg capsule,extended release 24 hr TK 1 C PO DAILY WITH BREAKFAST 11/14/17   filled surescripts zolpidem 5 mg tablet TK 1 T PO QHS PRF SLEEP 11/14/17   filled surescripts              Problems Reviewed Problems Body mass index 30+ - obesity - Onset: 07/15/2015 Morbid obesity - Onset: 07/16/2013 Mild cognitive disorder - Onset: 07/16/2013 Anxiety state - Onset: 10/04/2006 Anxiety - Onset: 07/16/2013 Depressive disorder - Onset: 10/04/2006 Chronic pain syndrome - Onset: 08/10/2016 Hearing loss - Onset: 09/04/2012 Chronic sinusitis - Onset: 10/04/2006 Osteoarthritis - Onset: 10/04/2006 Osteoarthritis of hip - Onset: 09/24/2017 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 -  Osteoarthritis of left knee joint -    Family History Reviewed Family History Paternal Grandfather - Heart disease   - Congestive heart failure Mother - Osteoarthritis   - Diabetes mellitus Maternal Grandmother - Osteoarthritis   - Chronic  obstructive lung disease Father - Diabetes mellitus   Social History Reviewed Social History Smoking Status: Former smoker Tobacco-years of use: 15 Alcohol intake: Occasional Hand Dominance: Right Are on Hormone Therapy?: N Caffeine: Y Advance directive: Y Medical Power of Attorney: Y Live alone or with others?: with others   Surgical History Reviewed Surgical History Total replacement of left hip joint Hysterectomy - 09/2005 Procedure on foot - Rigth Foot bil total knee arthroplasty  lt. total knee revision   Past Medical History Reviewed Past Medical History High Cholesterol: Y Notes: Impaired Memory,  Impaired Vision,  Heart Murmur,  Anxiety,  Depression   HPI The patient is here today for a pre-operative History and Physical. They are scheduled for right total hip replacement on 12/19/2017 with Dr. Aluisio at [facility]. [name] has had pain for over a year but has become severe in the past several months. It is hurting at all times including day and night. It is unrelenting and she cannot get comfortable. She had recent x-rays and was told she had arthritis or avascular necrosis. Both hips hurt but the left was the worst of the two. She has recently underwent the left hip replacement and has recovered very well. She does have pain in her RIGHT hip still which continues to affect how she walks. Radiographs, AP and lateral of both knees show both prostheses in excellent position with no periprosthetic abnormalities. Hip x-rays that she had elsewhere showed and she has severe end-stage arthritis of the LEFT worse than RIGHT hip (she has since had the left hip replaced).   The RIGHT hip side shows bone-on-bone without erosion. The patient has significant pain and dysfunction in their hip. They have significant functional limitations and have failed nonoperative management. At this point the most predictable means of improving pain and function is total hip arthroplasty.  We discussed the procedure risks, potential complication and rehab course in detail. At this time the patient would like to go ahead and proceed with the total hip arthroplasty. Risks and benefits have been discussed with the patient and she elects to proceed with surgery at this time. She has done well with the left hip and now wishes to proceed with the right total hip.   ROS Constitutional: Constitutional: no fever, chills, night sweats, or significant weight loss.  Cardiovascular: Cardiovascular: no palpitations or chest pain.  Respiratory: Respiratory: no cough or shortness of breath and No COPD.  Gastrointestinal: Gastrointestinal: no vomiting or nausea.  Musculoskeletal: Musculoskeletal: no swelling in Joints and Joint Pain.  Neurologic: Neurologic: no numbness, tingling, or difficulty with balance.   Physical Exam Patient is a 62-year-old female.  General Mental Status - Alert, cooperative and good historian. General Appearance - pleasant, Not in acute distress. Orientation - Oriented X3. Build & Nutrition - Well nourished and Well developed.  Head and Neck Head - normocephalic, atraumatic . Neck Global Assessment - supple, no bruit auscultated on the right, no bruit auscultated on the left.  Eye Pupil - Bilateral - PERR Motion - Bilateral - EOMI.  Chest and Lung Exam Auscultation Breath sounds - clear at anterior chest wall and clear at posterior chest wall. Adventitious sounds - No Adventitious sounds.  Cardiovascular Auscultation Rhythm - Regular rate and rhythm. Heart Sounds - S1 WNL and S2 WNL. Murmurs & Other Heart Sounds - Auscultation of the heart reveals - No Murmurs.  Abdomen Palpation/Percussion Tenderness - Abdomen is non-tender to palpation. Abdomen is soft. Auscultation Auscultation of the abdomen reveals - Bowel sounds normal.  Genitourinary Note: Not done, not pertinent to present illness  Musculoskeletal RIGHT hip can be flexed to 100 with  minimal internal rotation about 10 of external rotation 10 of abduction. Both knees show no swelling. Range is about 0-125. There is no instability about either knee in no specific tenderness about either knee.  Radiographs- AP pelvis, AP and lateral of the bilateral hips dated 10/30/2017 demonstrate a prosthesis in excellent position with no periprosthetic abnormalities on the left. On the right, she has bone-on-bone arthritis with subchondral cystic formation.   Assessment / Plan 1. Osteoarthritis of hip M16.11: Unilateral primary osteoarthritis, right hip  Goals Patient Instructions Surgical Plans: Right Total Hip Replacement - Anterior Approach Disposition: Home, HHPT (will bring informaiton to hospital) PCP: Dr. Bouska IV TXA Anesthesia Issues: None Patient was instructed on what medications to stop prior to surgery. - Follow up visit in 2 weeks with Dr. Aluisio - Begin physical therapy following surgery - Pre-operative lab work as pre Pre-Surgical Testing - Prescriptions will be provided in hospital at time of discharge  Return to Office Frank Aluisio, MD for [facility] on 01/01/2018 at 01:45 PM  Encounter signed-off by Cordella Nyquist, PA-C    Not done, not pertinent to present illness  Musculoskeletal RIGHT hip can be flexed to 100 with minimal internal rotation about 10 of external rotation 10 of abduction. Both knees show no swelling. Range is about 0-125. There is no instability about either knee in no specific tenderness about either knee.  Radiographs- AP pelvis, AP and lateral of the bilateral hips dated 10/30/2017 demonstrate a prosthesis in excellent position with no periprosthetic abnormalities on the left. On the right, she has bone-on-bone arthritis with subchondral cystic formation.   Assessment / Plan 1. Osteoarthritis of hip M16.11: Unilateral primary osteoarthritis, right hip  Goals Patient Instructions Surgical Plans: Right Total Hip Replacement - Anterior Approach Disposition: Home, HHPT (will bring informaiton to hospital) PCP: Dr. Everlene Other IV TXA Anesthesia Issues: None Patient was instructed on what medications to stop prior to surgery. - Follow up visit in 2 weeks with Dr. Lequita Halt - Begin physical therapy following surgery - Pre-operative lab work as pre Pre-Surgical Testing - Prescriptions will be provided in hospital at time of discharge  Return to Office Ollen Gross, MD for [facility] on 01/01/2018 at 01:45 PM  Encounter signed-off by Patrica Duel, PA-C

## 2017-11-23 NOTE — H&P (View-Only) (Signed)
[name] [name] [name], [name] [name] [phone], F)  DOB 1956-04-29   Chief Complaint Right Hip Pain H&P right THA on 12/19/2017 at [facility]  Patient's Pharmacies [facility]): [address], [place] [address], Ph [phone], Fax [phone]   Vitals Ht: 5 ft  Wt: 168 lbs  BMI: 32.8  BP: 120/68  Pulse: 84 bpm    Allergies Reviewed Allergies STATINS-HMG-COA REDUCTASE INHIBITORS    Medications Reviewed Medications buPROPion HCl XL 150 mg 24 hr tablet, extended release 150 mg. 11/19/17   filled surescripts HYDROCODONE/ACETAMINOPHEN 10-325 MG TABS 10/22/17   filled [facility] [employer] [facility] lamoTRIgine 200 mg tablet 200 mg. 11/14/17   filled surescripts timolol 0.5 % eye drops start 06/21/2015 06/21/15   filled [name] [name] [name] [name] [name] timolol maleate 0.5 % eye drops INT 1 GTT IN [facility] EYE D IN THE MORNING 09/24/17   filled surescripts TRAVATAN Z 0.004 % SOLN 09/20/17   filled [facility] [employer] [facility] venlafaxine ER 150 mg capsule,extended release 24 hr TK 1 C PO DAILY WITH BREAKFAST 11/14/17   filled surescripts zolpidem 5 mg tablet TK 1 T PO QHS PRF SLEEP 11/14/17   filled surescripts              Problems Reviewed Problems Body mass index 30+ - obesity - Onset: 07/15/2015 Morbid obesity - Onset: 07/16/2013 Mild cognitive disorder - Onset: 07/16/2013 Anxiety state - Onset: 10/04/2006 Anxiety - Onset: 07/16/2013 Depressive disorder - Onset: 10/04/2006 Chronic pain syndrome - Onset: 08/10/2016 Hearing loss - Onset: 09/04/2012 Chronic sinusitis - Onset: 10/04/2006 Osteoarthritis - Onset: 10/04/2006 Osteoarthritis of hip - Onset: 09/24/2017 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 -  Osteoarthritis of left knee joint -    Family History Reviewed Family History Paternal Grandfather - Heart disease   - Congestive heart failure Mother - Osteoarthritis   - Diabetes mellitus Maternal Grandmother - Osteoarthritis   - Chronic  obstructive lung disease Father - Diabetes mellitus   Social History Reviewed Social History Smoking Status: Former smoker Tobacco-years of use: 15 Alcohol intake: Occasional Hand Dominance: Right Are on Hormone Therapy?: N Caffeine: Y Advance directive: Y Medical Power of Attorney: Y Live alone or with others?: with others   Surgical History Reviewed Surgical History Total replacement of left hip joint Hysterectomy - 09/2005 Procedure on foot - Rigth Foot bil total knee arthroplasty  lt. total knee revision   Past Medical History Reviewed Past Medical History High Cholesterol: Y Notes: Impaired Memory,  Impaired Vision,  Heart Murmur,  Anxiety,  Depression   HPI The patient is here today for a pre-operative History and Physical. They are scheduled for right total hip replacement on 12/19/2017 with Dr. Lequita Halt at [facility]. [name] [name] [name] [name] [name] has had pain for over a year but has become severe in the past several months. It is hurting at all times including day and night. It is unrelenting and she cannot get comfortable. She had recent x-rays and was told she had arthritis or avascular necrosis. Both hips hurt but the left was the worst of the two. She has recently underwent the left hip replacement and has recovered very well. She does have pain in her RIGHT hip still which continues to affect how she walks. Radiographs, AP and lateral of both knees show both prostheses in excellent position with no periprosthetic abnormalities. Hip x-rays that she had elsewhere showed and she has severe end-stage arthritis of the LEFT worse than RIGHT hip (she has since had the left hip replaced).  The RIGHT hip side shows bone-on-bone without erosion. The patient has significant pain and dysfunction in their hip. They have significant functional limitations and have failed nonoperative management. At this point the most predictable means of improving pain and function is total hip arthroplasty.  We discussed the procedure risks, potential complication and rehab course in detail. At this time the patient would like to go ahead and proceed with the total hip arthroplasty. Risks and benefits have been discussed with the patient and she elects to proceed with surgery at this time. She has done well with the left hip and now wishes to proceed with the right total hip.   ROS Constitutional: Constitutional: no fever, chills, night sweats, or significant weight loss.  Cardiovascular: Cardiovascular: no palpitations or chest pain.  Respiratory: Respiratory: no cough or shortness of breath and No COPD.  Gastrointestinal: Gastrointestinal: no vomiting or nausea.  Musculoskeletal: Musculoskeletal: no swelling in Joints and Joint Pain.  Neurologic: Neurologic: no numbness, tingling, or difficulty with balance.   Physical Exam Patient is a 62 year old female.  General Mental Status - Alert, cooperative and good historian. General Appearance - pleasant, Not in acute distress. Orientation - Oriented X3. Build & Nutrition - Well nourished and Well developed.  Head and Neck Head - normocephalic, atraumatic . Neck Global Assessment - supple, no bruit auscultated on the right, no bruit auscultated on the left.  Eye Pupil - Bilateral - PERR Motion - Bilateral - EOMI.  Chest and Lung Exam Auscultation Breath sounds - clear at anterior chest wall and clear at posterior chest wall. Adventitious sounds - No Adventitious sounds.  Cardiovascular Auscultation Rhythm - Regular rate and rhythm. Heart Sounds - S1 WNL and S2 WNL. Murmurs & Other Heart Sounds - Auscultation of the heart reveals - No Murmurs.  Abdomen Palpation/Percussion Tenderness - Abdomen is non-tender to palpation. Abdomen is soft. Auscultation Auscultation of the abdomen reveals - Bowel sounds normal.  Genitourinary Note: Not done, not pertinent to present illness  Musculoskeletal RIGHT hip can be flexed to 100 with  minimal internal rotation about 10 of external rotation 10 of abduction. Both knees show no swelling. Range is about 0-125. There is no instability about either knee in no specific tenderness about either knee.  Radiographs- AP pelvis, AP and lateral of the bilateral hips dated 10/30/2017 demonstrate a prosthesis in excellent position with no periprosthetic abnormalities on the left. On the right, she has bone-on-bone arthritis with subchondral cystic formation.   Assessment / Plan 1. Osteoarthritis of hip M16.11: Unilateral primary osteoarthritis, right hip  Goals Patient Instructions Surgical Plans: Right Total Hip Replacement - Anterior Approach Disposition: Home, HHPT (will bring informaiton to hospital) PCP: Dr. Everlene OtherBouska IV TXA Anesthesia Issues: None Patient was instructed on what medications to stop prior to surgery. - Follow up visit in 2 weeks with Dr. Lequita HaltAluisio - Begin physical therapy following surgery - Pre-operative lab work as pre Pre-Surgical Testing - Prescriptions will be provided in hospital at time of discharge  Return to Office Ollen GrossFrank Aluisio, MD for [facility]Post[facility]Op [facility] on 01/01/2018 at 01:45 PM  Encounter signed-off by Patrica DuelALEXZANDREW PERKINS, PA-C

## 2017-12-11 ENCOUNTER — Encounter (HOSPITAL_COMMUNITY): Payer: Self-pay

## 2017-12-11 ENCOUNTER — Encounter (HOSPITAL_COMMUNITY)
Admission: RE | Admit: 2017-12-11 | Discharge: 2017-12-11 | Disposition: A | Discharge status: Home / Self Care | Payer: PRIVATE HEALTH INSURANCE | Source: Ambulatory Visit | Attending: Orthopedic Surgery | Admitting: Orthopedic Surgery

## 2017-12-11 ENCOUNTER — Other Ambulatory Visit: Payer: Self-pay

## 2017-12-11 DIAGNOSIS — M1611 Unilateral primary osteoarthritis, right hip: Secondary | ICD-10-CM | POA: Diagnosis not present

## 2017-12-11 DIAGNOSIS — Z01818 Encounter for other preprocedural examination: Secondary | ICD-10-CM | POA: Insufficient documentation

## 2017-12-11 HISTORY — DX: Major depressive disorder, single episode, unspecified: F32.9

## 2017-12-11 LAB — COMPREHENSIVE METABOLIC PANEL
ALK PHOS: 54 U/L (ref 38–126)
ALT: 17 U/L (ref 14–54)
ANION GAP: 5 (ref 5–15)
AST: 26 U/L (ref 15–41)
Albumin: 3.4 g/dL — ABNORMAL LOW (ref 3.5–5.0)
BILIRUBIN TOTAL: 0.3 mg/dL (ref 0.3–1.2)
BUN: 8 mg/dL (ref 6–20)
CO2: 29 mmol/L (ref 22–32)
Calcium: 9.3 mg/dL (ref 8.9–10.3)
Chloride: 109 mmol/L (ref 101–111)
Creatinine, Ser: 0.66 mg/dL (ref 0.44–1.00)
Glucose, Bld: 105 mg/dL — ABNORMAL HIGH (ref 65–99)
Potassium: 4.2 mmol/L (ref 3.5–5.1)
SODIUM: 143 mmol/L (ref 135–145)
TOTAL PROTEIN: 6.4 g/dL — AB (ref 6.5–8.1)

## 2017-12-11 LAB — CBC
HCT: 38.1 % (ref 36.0–46.0)
HEMOGLOBIN: 12.1 g/dL (ref 12.0–15.0)
MCH: 31.3 pg (ref 26.0–34.0)
MCHC: 31.8 g/dL (ref 30.0–36.0)
MCV: 98.4 fL (ref 78.0–100.0)
Platelets: 253 10*3/uL (ref 150–400)
RBC: 3.87 MIL/uL (ref 3.87–5.11)
RDW: 13.5 % (ref 11.5–15.5)
WBC: 5.7 10*3/uL (ref 4.0–10.5)

## 2017-12-11 LAB — APTT: APTT: 27 s (ref 24–36)

## 2017-12-11 LAB — SURGICAL PCR SCREEN
MRSA, PCR: NEGATIVE
Staphylococcus aureus: POSITIVE — AB

## 2017-12-11 LAB — PROTIME-INR
INR: 0.87
PROTHROMBIN TIME: 11.7 s (ref 11.4–15.2)

## 2017-12-11 NOTE — Patient Instructions (Addendum)
[name] [name]  12/11/2017   Your procedure is scheduled on:   12-19-2017  Report to [facility] Main  Entrance  Report to admitting at     7:45 AM    Call this number if you have problems the morning of surgery [phone]   Remember: Do not eat food or drink liquids :After Midnight.          BRING BiPAP MASK AND TUBING   Take these medicines the morning of surgery with A SIP OF WATER:  Wellbutrin, Lamictal, claritin if needed, effexor, eye drops as usual                                 You may not have any metal on your body including hair pins and              piercings  Do not wear jewelry, make-up, lotions, powders or perfumes, deodorant             Do not wear nail polish.  Do not shave  48 hours prior to surgery.     Do not bring valuables to the hospital. [facility] IS NOT             RESPONSIBLE   FOR VALUABLES.  Contacts, dentures or bridgework may not be worn into surgery.  Leave suitcase in the car. After surgery it may be brought to your room.                   Please read over the following fact sheets you were given: _____________________________________________________________________             [facility] - Preparing for Surgery Before surgery, you can play an important role.  Because skin is not sterile, your skin needs to be as free of germs as possible.  You can reduce the number of germs on your skin by washing with CHG (chlorahexidine gluconate) soap before surgery.  CHG is an antiseptic cleaner which kills germs and bonds with the skin to continue killing germs even after washing. Please DO NOT use if you have an allergy to CHG or antibacterial soaps.  If your skin becomes reddened/irritated stop using the CHG and inform your nurse when you arrive at Short Stay. Do not shave (including legs and underarms) for at least 48 hours prior to the first CHG shower.  You may shave your face/neck. Please follow these instructions  carefully:  1.  Shower with CHG Soap the night before surgery and the  morning of Surgery.  2.  If you choose to wash your hair, wash your hair first as usual with your  normal  shampoo.  3.  After you shampoo, rinse your hair and body thoroughly to remove the  shampoo.                           4.  Use CHG as you would any other liquid soap.  You can apply chg directly  to the skin and wash                       Gently with a scrungie or clean washcloth.  5.  Apply the CHG Soap to your body ONLY FROM THE NECK DOWN.   Do not  use on face/ open                           Wound or open sores. Avoid contact with eyes, ears mouth and genitals (private parts).                       Wash face,  Genitals (private parts) with your normal soap.             6.  Wash thoroughly, paying special attention to the area where your surgery  will be performed.  7.  Thoroughly rinse your body with warm water from the neck down.  8.  DO NOT shower/wash with your normal soap after using and rinsing off  the CHG Soap.                9.  Pat yourself dry with a clean towel.            10.  Wear clean pajamas.            11.  Place clean sheets on your bed the night of your first shower and do not  sleep with pets. Day of Surgery : Do not apply any lotions/deodorants the morning of surgery.  Please wear clean clothes to the hospital/surgery center.  FAILURE TO FOLLOW THESE INSTRUCTIONS MAY RESULT IN THE CANCELLATION OF YOUR SURGERY PATIENT SIGNATURE_________________________________  NURSE SIGNATURE__________________________________  ________________________________________________________________________  WHAT IS A BLOOD TRANSFUSION? Blood Transfusion Information  A transfusion is the replacement of blood or some of its parts. Blood is made up of multiple cells which provide different functions.  Red blood cells carry oxygen and are used for blood loss replacement.  White blood cells fight against  infection.  Platelets control bleeding.  Plasma helps clot blood.  Other blood products are available for specialized needs, such as hemophilia or other clotting disorders. BEFORE THE TRANSFUSION  Who gives blood for transfusions?   Healthy volunteers who are fully evaluated to make sure their blood is safe. This is blood bank blood. Transfusion therapy is the safest it has ever been in the practice of medicine. Before blood is taken from a donor, a complete history is taken to make sure that person has no history of diseases nor engages in risky social behavior (examples are intravenous drug use or sexual activity with multiple partners). The donor's travel history is screened to minimize risk of transmitting infections, such as malaria. The donated blood is tested for signs of infectious diseases, such as HIV and hepatitis. The blood is then tested to be sure it is compatible with you in order to minimize the chance of a transfusion reaction. If you or a relative donates blood, this is often done in anticipation of surgery and is not appropriate for emergency situations. It takes many days to process the donated blood. RISKS AND COMPLICATIONS Although transfusion therapy is very safe and saves many lives, the main dangers of transfusion include:   Getting an infectious disease.  Developing a transfusion reaction. This is an allergic reaction to something in the blood you were given. Every precaution is taken to prevent this. The decision to have a blood transfusion has been considered carefully by your caregiver before blood is given. Blood is not given unless the benefits outweigh the risks. AFTER THE TRANSFUSION  Right after receiving a blood transfusion, you will usually feel much better and more energetic. This is especially true if  your red blood cells have gotten low (anemic). The transfusion raises the level of the red blood cells which carry oxygen, and this usually causes an energy  increase.  The nurse administering the transfusion will monitor you carefully for complications. HOME CARE INSTRUCTIONS  No special instructions are needed after a transfusion. You may find your energy is better. Speak with your caregiver about any limitations on activity for underlying diseases you may have. SEEK MEDICAL CARE IF:   Your condition is not improving after your transfusion.  You develop redness or irritation at the intravenous (IV) site. SEEK IMMEDIATE MEDICAL CARE IF:  Any of the following symptoms occur over the next 12 hours:  Shaking chills.  You have a temperature by mouth above 102 F (38.9 C), not controlled by medicine.  Chest, back, or muscle pain.  People around you feel you are not acting correctly or are confused.  Shortness of breath or difficulty breathing.  Dizziness and fainting.  You get a rash or develop hives.  You have a decrease in urine output.  Your urine turns a dark color or changes to pink, red, or brown. Any of the following symptoms occur over the next 10 days:  You have a temperature by mouth above 102 F (38.9 C), not controlled by medicine.  Shortness of breath.  Weakness after normal activity.  The white part of the eye turns yellow (jaundice).  You have a decrease in the amount of urine or are urinating less often.  Your urine turns a dark color or changes to pink, red, or brown. Document Released: 07/21/2000 Document Revised: 10/16/2011 Document Reviewed: 03/09/2008 ExitCare Patient Information 2014 [place], [place].  _______________________________________________________________________  Incentive Spirometer  An incentive spirometer is a tool that can help keep your lungs clear and active. This tool measures how well you are filling your lungs with each breath. Taking long deep breaths may help reverse or decrease the chance of developing breathing (pulmonary) problems (especially infection) following:  A long  period of time when you are unable to move or be active. BEFORE THE PROCEDURE   If the spirometer includes an indicator to show your best effort, your nurse or respiratory therapist will set it to a desired goal.  If possible, sit up straight or lean slightly forward. Try not to slouch.  Hold the incentive spirometer in an upright position. INSTRUCTIONS FOR USE  1. Sit on the edge of your bed if possible, or sit up as far as you can in bed or on a chair. 2. Hold the incentive spirometer in an upright position. 3. Breathe out normally. 4. Place the mouthpiece in your mouth and seal your lips tightly around it. 5. Breathe in slowly and as deeply as possible, raising the piston or the ball toward the top of the column. 6. Hold your breath for 3-5 seconds or for as long as possible. Allow the piston or ball to fall to the bottom of the column. 7. Remove the mouthpiece from your mouth and breathe out normally. 8. Rest for a few seconds and repeat Steps 1 through 7 at least 10 times every 1-2 hours when you are awake. Take your time and take a few normal breaths between deep breaths. 9. The spirometer may include an indicator to show your best effort. Use the indicator as a goal to work toward during each repetition. 10. After each set of 10 deep breaths, practice coughing to be sure your lungs are clear. If you have an incision (  the cut made at the time of surgery), support your incision when coughing by placing a pillow or rolled up towels firmly against it. Once you are able to get out of bed, walk around indoors and cough well. You may stop using the incentive spirometer when instructed by your caregiver.  RISKS AND COMPLICATIONS  Take your time so you do not get dizzy or light-headed.  If you are in pain, you may need to take or ask for pain medication before doing incentive spirometry. It is harder to take a deep breath if you are having pain. AFTER USE  Rest and breathe slowly and  easily.  It can be helpful to keep track of a log of your progress. Your caregiver can provide you with a simple table to help with this. If you are using the spirometer at home, follow these instructions: SEEK MEDICAL CARE IF:   You are having difficultly using the spirometer.  You have trouble using the spirometer as often as instructed.  Your pain medication is not giving enough relief while using the spirometer.  You develop fever of 100.5 F (38.1 C) or higher. SEEK IMMEDIATE MEDICAL CARE IF:   You cough up bloody sputum that had not been present before.  You develop fever of 102 F (38.9 C) or greater.  You develop worsening pain at or near the incision site. MAKE SURE YOU:   Understand these instructions.  Will watch your condition.  Will get help right away if you are not doing well or get worse. Document Released: 12/04/2006 Document Revised: 10/16/2011 Document Reviewed: 02/04/2007 [facility] Patient Information 2014 [place], [place].   ________________________________________________________________________

## 2017-12-11 NOTE — Progress Notes (Signed)
EKg-07/06/17-epic CXR-07/04/17-epic

## 2017-12-13 NOTE — Progress Notes (Addendum)
Pt has surgery start time change to 1415 from 1015 on 12-19-2017.  Called pt and lvm for her to call back to give instructions about surgery time change.  ADDENDUM:  Received call back via phone from pt .  Pt verbalized understanding surgery start time to 1415;  arrive [facility] hospital at 1145 on 12-19-2017 and no food after midnight with exception clear liquid diet until 0815 morning of surgery then nothing by mouth after 0815.

## 2017-12-16 IMAGING — CR DG HIP (WITH OR WITHOUT PELVIS) 3-4V BILAT
5 series · 5 of 5 positions shown · non-contrast
Comparison: None.

CLINICAL DATA: Bilateral hip pain

EXAM:
DG HIP (WITH OR WITHOUT PELVIS) 3-4V BILAT

[w pelvis upright]
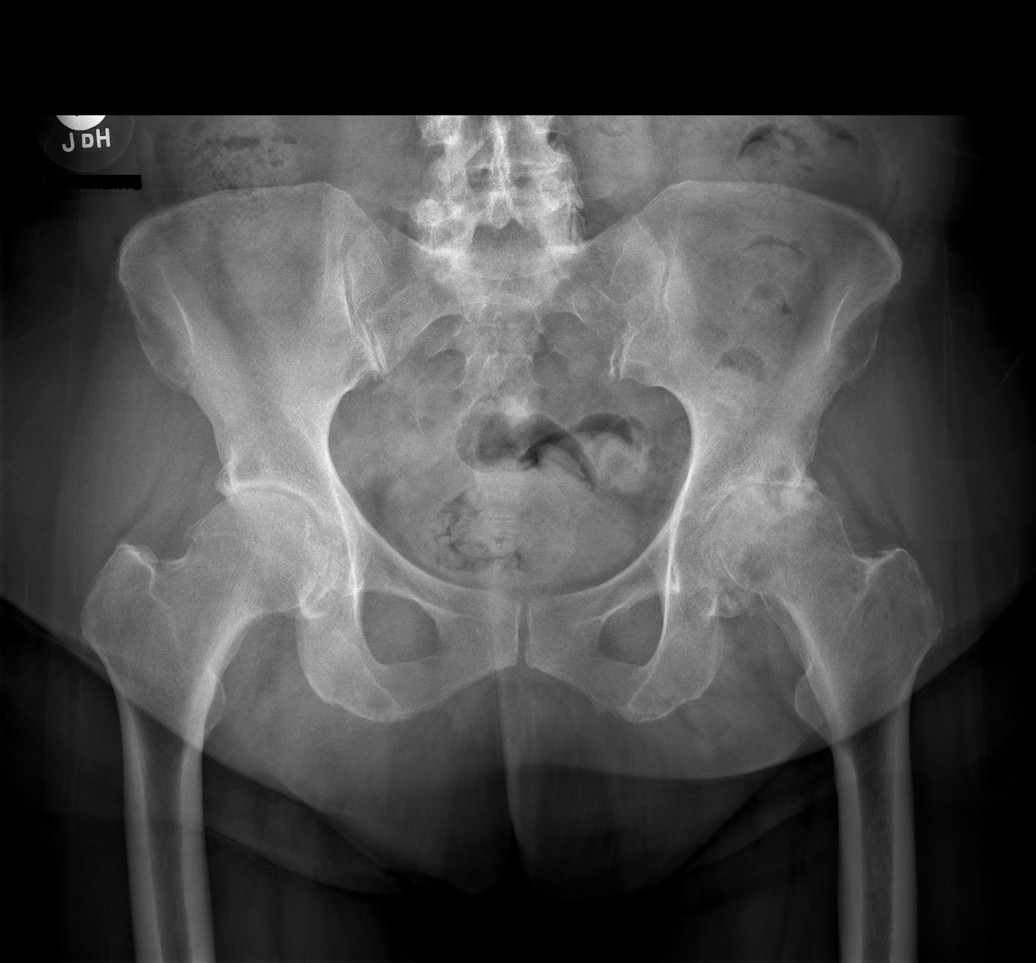

[w hip ap left]
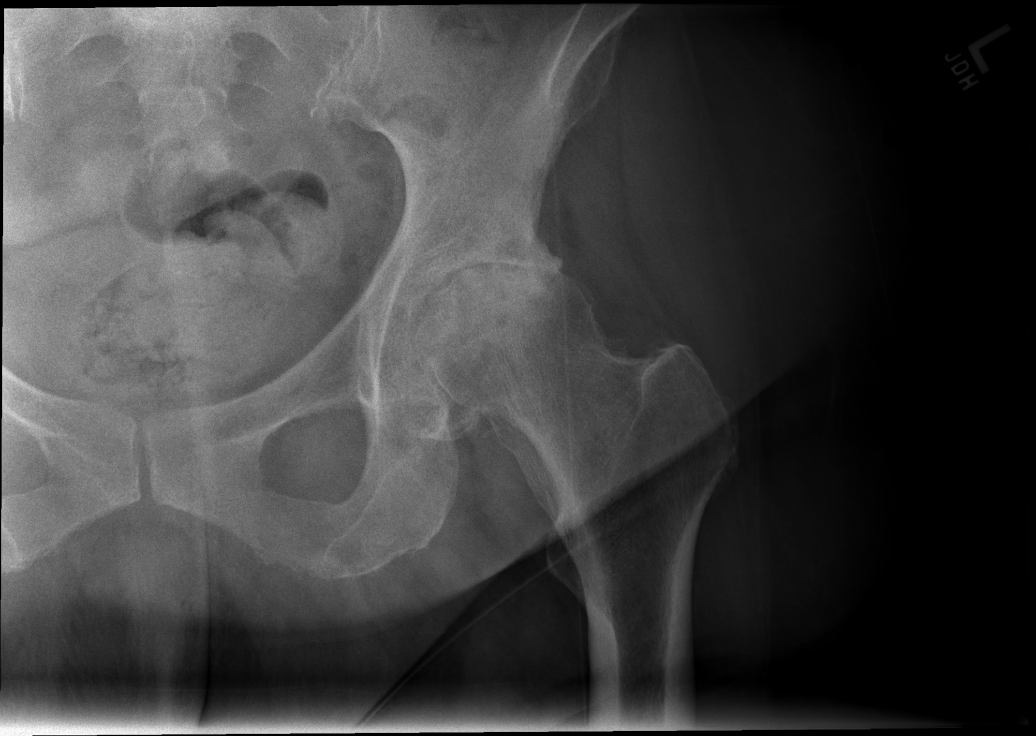

[w hip frog left]
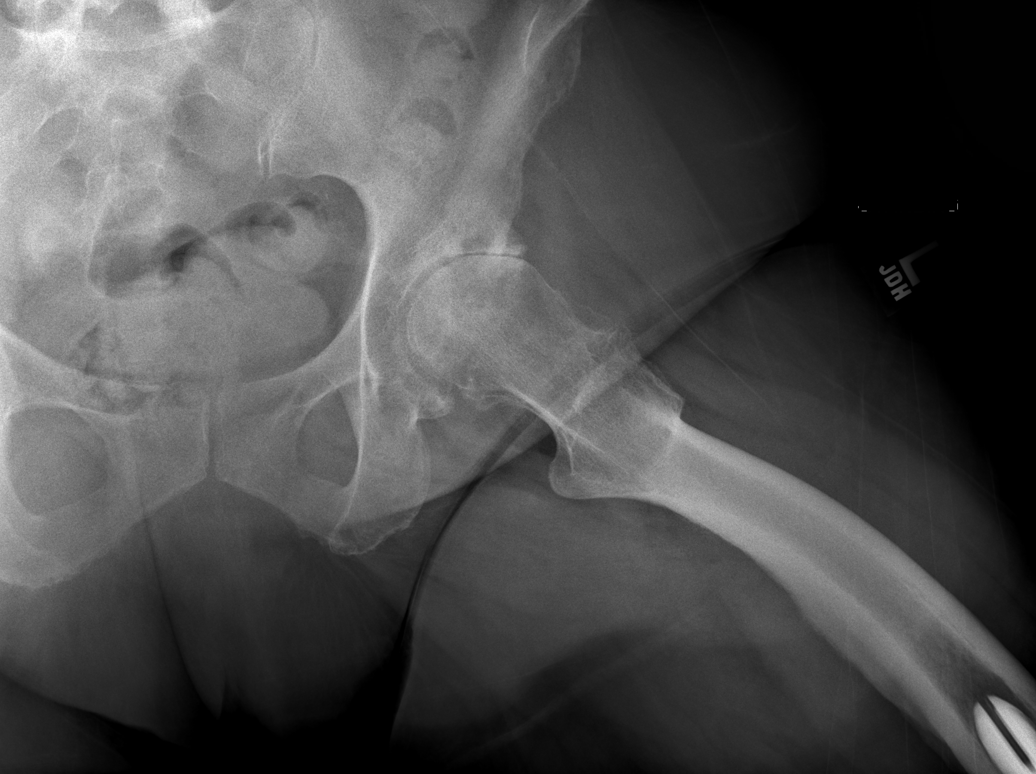

[w hip ap right]
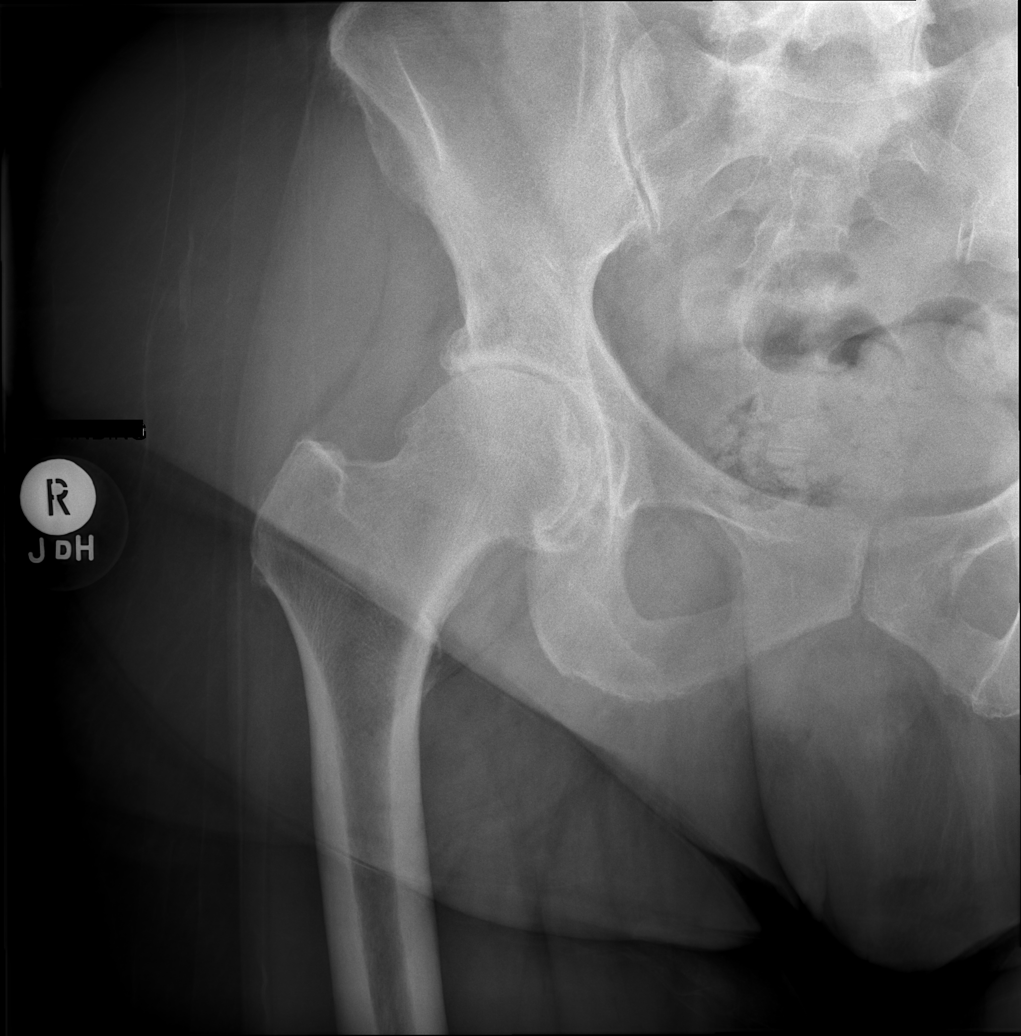

[w hip frog right]
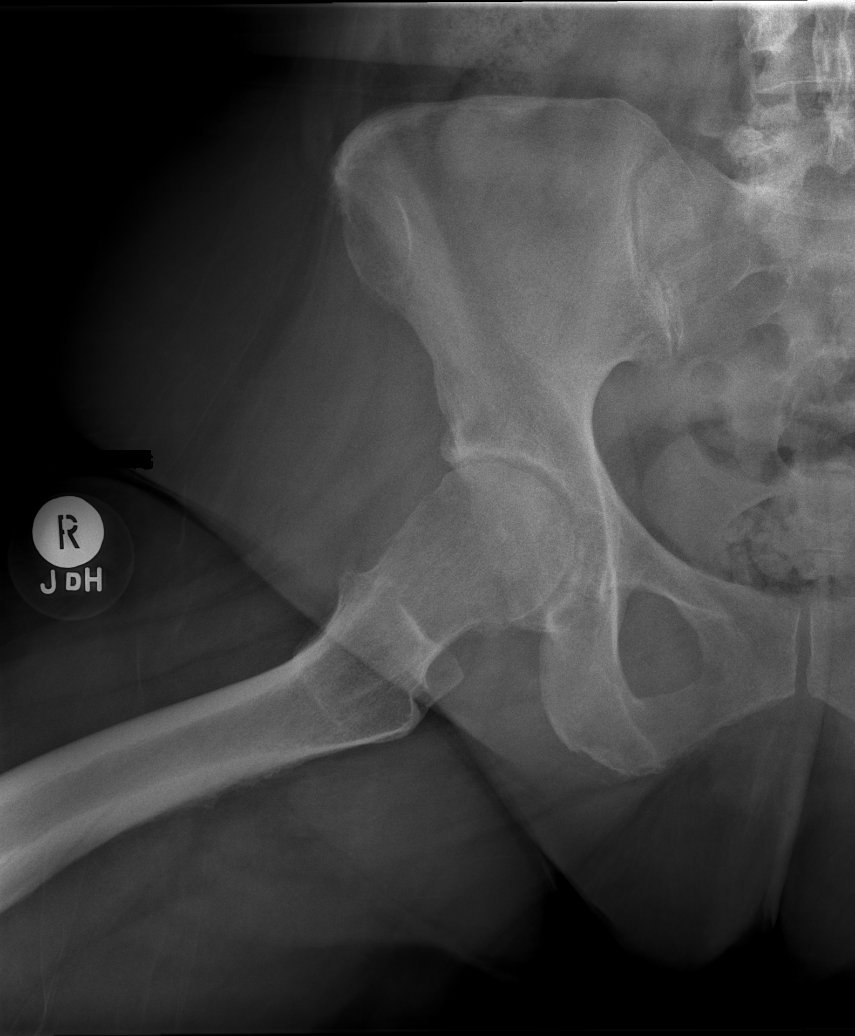

[5 of 5 positions shown; findings below may reference images not displayed]

FINDINGS: Frontal pelvis as well as frontal and lateral images of both hips
-total five views- obtained. All images obtained with patient
standing. There is no acute fracture or dislocation. There is marked
narrowing on the left with remodeling of the left femoral head
consistent with a degree of avascular necrosis. There is subchondral
cystic change throughout the joint on the left. There is moderately
severe narrowing. There is subchondral cystic change in the right
femoral head with questionable earlier avascular necrosis along the
9 medial femoral head on the right. Sacroiliac joints appear normal
bilaterally.
IMPRESSION: Extensive arthropathy in both hip joints, more severe on the left
than on the right. There is avascular necrosis with remodeling in
the left femoral head. Suspect more subtle avascular necrosis right
femoral head.

No acute fracture or dislocation.

From an imaging standpoint, MR would be the imaging study of choice
to evaluate and assess extent of avascular necrosis in the proximal
femurs.

## 2017-12-18 MED ORDER — TRANEXAMIC ACID 1000 MG/10ML IV SOLN
1000.0000 mg | INTRAVENOUS | Status: AC
  Administered 2017-12-19: 1000 mg via INTRAVENOUS
  Filled 2017-12-18: qty 1100

## 2017-12-19 ENCOUNTER — Inpatient Hospital Stay (HOSPITAL_COMMUNITY): Payer: PRIVATE HEALTH INSURANCE | Admitting: Anesthesiology

## 2017-12-19 ENCOUNTER — Encounter (HOSPITAL_COMMUNITY): Payer: Self-pay

## 2017-12-19 ENCOUNTER — Inpatient Hospital Stay (HOSPITAL_COMMUNITY): Payer: PRIVATE HEALTH INSURANCE

## 2017-12-19 ENCOUNTER — Other Ambulatory Visit: Payer: Self-pay

## 2017-12-19 ENCOUNTER — Inpatient Hospital Stay (HOSPITAL_COMMUNITY)
Admission: RE | Admit: 2017-12-19 | Discharge: 2017-12-21 | DRG: 470 | Disposition: A | Discharge status: Home Health | Payer: PRIVATE HEALTH INSURANCE | Source: Ambulatory Visit | Attending: Orthopedic Surgery | Admitting: Orthopedic Surgery

## 2017-12-19 ENCOUNTER — Encounter (HOSPITAL_COMMUNITY)
Admission: RE | Disposition: A | Discharge status: Home Health | Payer: Self-pay | Source: Ambulatory Visit | Attending: Orthopedic Surgery

## 2017-12-19 DIAGNOSIS — Z8261 Family history of arthritis: Secondary | ICD-10-CM | POA: Diagnosis not present

## 2017-12-19 DIAGNOSIS — G3184 Mild cognitive impairment, so stated: Secondary | ICD-10-CM | POA: Diagnosis present

## 2017-12-19 DIAGNOSIS — Z8249 Family history of ischemic heart disease and other diseases of the circulatory system: Secondary | ICD-10-CM | POA: Diagnosis not present

## 2017-12-19 DIAGNOSIS — R011 Cardiac murmur, unspecified: Secondary | ICD-10-CM | POA: Diagnosis present

## 2017-12-19 DIAGNOSIS — G4733 Obstructive sleep apnea (adult) (pediatric): Secondary | ICD-10-CM | POA: Diagnosis present

## 2017-12-19 DIAGNOSIS — G894 Chronic pain syndrome: Secondary | ICD-10-CM | POA: Diagnosis present

## 2017-12-19 DIAGNOSIS — F419 Anxiety disorder, unspecified: Secondary | ICD-10-CM | POA: Diagnosis present

## 2017-12-19 DIAGNOSIS — M25551 Pain in right hip: Secondary | ICD-10-CM | POA: Diagnosis present

## 2017-12-19 DIAGNOSIS — Z96649 Presence of unspecified artificial hip joint: Secondary | ICD-10-CM

## 2017-12-19 DIAGNOSIS — Z974 Presence of external hearing-aid: Secondary | ICD-10-CM

## 2017-12-19 DIAGNOSIS — Z87891 Personal history of nicotine dependence: Secondary | ICD-10-CM

## 2017-12-19 DIAGNOSIS — M1611 Unilateral primary osteoarthritis, right hip: Principal | ICD-10-CM | POA: Diagnosis present

## 2017-12-19 DIAGNOSIS — Z888 Allergy status to other drugs, medicaments and biological substances status: Secondary | ICD-10-CM

## 2017-12-19 DIAGNOSIS — H409 Unspecified glaucoma: Secondary | ICD-10-CM | POA: Diagnosis present

## 2017-12-19 DIAGNOSIS — Z833 Family history of diabetes mellitus: Secondary | ICD-10-CM | POA: Diagnosis not present

## 2017-12-19 DIAGNOSIS — H9190 Unspecified hearing loss, unspecified ear: Secondary | ICD-10-CM | POA: Diagnosis present

## 2017-12-19 DIAGNOSIS — Z825 Family history of asthma and other chronic lower respiratory diseases: Secondary | ICD-10-CM | POA: Diagnosis not present

## 2017-12-19 DIAGNOSIS — Z91048 Other nonmedicinal substance allergy status: Secondary | ICD-10-CM

## 2017-12-19 DIAGNOSIS — E669 Obesity, unspecified: Secondary | ICD-10-CM | POA: Diagnosis present

## 2017-12-19 DIAGNOSIS — Z90710 Acquired absence of both cervix and uterus: Secondary | ICD-10-CM | POA: Diagnosis not present

## 2017-12-19 DIAGNOSIS — Z6833 Body mass index (BMI) 33.0-33.9, adult: Secondary | ICD-10-CM | POA: Diagnosis not present

## 2017-12-19 DIAGNOSIS — E785 Hyperlipidemia, unspecified: Secondary | ICD-10-CM | POA: Diagnosis present

## 2017-12-19 DIAGNOSIS — E7800 Pure hypercholesterolemia, unspecified: Secondary | ICD-10-CM | POA: Diagnosis present

## 2017-12-19 DIAGNOSIS — Z96642 Presence of left artificial hip joint: Secondary | ICD-10-CM | POA: Diagnosis present

## 2017-12-19 DIAGNOSIS — F329 Major depressive disorder, single episode, unspecified: Secondary | ICD-10-CM | POA: Diagnosis present

## 2017-12-19 DIAGNOSIS — Z96653 Presence of artificial knee joint, bilateral: Secondary | ICD-10-CM | POA: Diagnosis present

## 2017-12-19 DIAGNOSIS — M169 Osteoarthritis of hip, unspecified: Secondary | ICD-10-CM | POA: Diagnosis present

## 2017-12-19 HISTORY — PX: TOTAL HIP ARTHROPLASTY: SHX124

## 2017-12-19 LAB — TYPE AND SCREEN
ABO/RH(D): A POS
Antibody Screen: NEGATIVE

## 2017-12-19 SURGERY — ARTHROPLASTY, HIP, TOTAL, ANTERIOR APPROACH
Anesthesia: Spinal | Site: Hip | Laterality: Right

## 2017-12-19 MED ORDER — LACTATED RINGERS IV SOLN
INTRAVENOUS | Status: DC
  Administered 2017-12-19 (×2): via INTRAVENOUS

## 2017-12-19 MED ORDER — HYDROMORPHONE HCL 1 MG/ML IJ SOLN
INTRAMUSCULAR | Status: AC
  Filled 2017-12-19: qty 1

## 2017-12-19 MED ORDER — TIMOLOL MALEATE 0.5 % OP SOLN
1.0000 [drp] | Freq: Every day | OPHTHALMIC | Status: DC
  Administered 2017-12-20 – 2017-12-21 (×2): 1 [drp] via OPHTHALMIC
  Filled 2017-12-19: qty 5

## 2017-12-19 MED ORDER — LAMOTRIGINE 100 MG PO TABS
200.0000 mg | ORAL_TABLET | Freq: Every day | ORAL | Status: DC
  Administered 2017-12-20 – 2017-12-21 (×2): 200 mg via ORAL
  Filled 2017-12-19 (×2): qty 2

## 2017-12-19 MED ORDER — CEFAZOLIN SODIUM-DEXTROSE 2-4 GM/100ML-% IV SOLN
2.0000 g | INTRAVENOUS | Status: AC
  Administered 2017-12-19: 2 g via INTRAVENOUS
  Filled 2017-12-19: qty 100

## 2017-12-19 MED ORDER — HYDROMORPHONE HCL 1 MG/ML IJ SOLN
0.5000 mg | INTRAMUSCULAR | Status: DC | PRN
  Administered 2017-12-20: 0.5 mg via INTRAVENOUS
  Administered 2017-12-20: 1 mg via INTRAVENOUS
  Administered 2017-12-21: 0.5 mg via INTRAVENOUS
  Filled 2017-12-19 (×3): qty 1

## 2017-12-19 MED ORDER — CEFAZOLIN SODIUM-DEXTROSE 1-4 GM/50ML-% IV SOLN
1.0000 g | Freq: Four times a day (QID) | INTRAVENOUS | Status: AC
  Administered 2017-12-19 – 2017-12-20 (×2): 1 g via INTRAVENOUS
  Filled 2017-12-19 (×2): qty 50

## 2017-12-19 MED ORDER — DOCUSATE SODIUM 100 MG PO CAPS
100.0000 mg | ORAL_CAPSULE | Freq: Two times a day (BID) | ORAL | Status: DC
  Administered 2017-12-19 – 2017-12-21 (×4): 100 mg via ORAL
  Filled 2017-12-19 (×4): qty 1

## 2017-12-19 MED ORDER — CHLORHEXIDINE GLUCONATE 4 % EX LIQD: 60.0000 mL | Freq: Once | CUTANEOUS | Status: DC

## 2017-12-19 MED ORDER — MENTHOL 3 MG MT LOZG: 1.0000 | LOZENGE | OROMUCOSAL | Status: DC | PRN

## 2017-12-19 MED ORDER — BUPIVACAINE HCL (PF) 0.25 % IJ SOLN
INTRAMUSCULAR | Status: AC
  Filled 2017-12-19: qty 30

## 2017-12-19 MED ORDER — METHOCARBAMOL 500 MG PO TABS: 500.0000 mg | ORAL_TABLET | Freq: Four times a day (QID) | ORAL | 0 refills | Status: DC | PRN

## 2017-12-19 MED ORDER — STERILE WATER FOR IRRIGATION IR SOLN
Status: DC | PRN
  Administered 2017-12-19: 2000 mL

## 2017-12-19 MED ORDER — MIDAZOLAM HCL 2 MG/2ML IJ SOLN
INTRAMUSCULAR | Status: AC
  Filled 2017-12-19: qty 2

## 2017-12-19 MED ORDER — DEXTROSE 5 % IV SOLN
INTRAVENOUS | Status: DC | PRN
  Administered 2017-12-19: 25 ug/min via INTRAVENOUS

## 2017-12-19 MED ORDER — ONDANSETRON HCL 4 MG/2ML IJ SOLN: 4.0000 mg | Freq: Four times a day (QID) | INTRAMUSCULAR | Status: DC | PRN

## 2017-12-19 MED ORDER — LATANOPROST 0.005 % OP SOLN
1.0000 [drp] | Freq: Every day | OPHTHALMIC | Status: DC
  Administered 2017-12-19 – 2017-12-20 (×2): 1 [drp] via OPHTHALMIC
  Filled 2017-12-19: qty 2.5

## 2017-12-19 MED ORDER — ONDANSETRON HCL 4 MG PO TABS: 4.0000 mg | ORAL_TABLET | Freq: Four times a day (QID) | ORAL | Status: DC | PRN

## 2017-12-19 MED ORDER — ZOLPIDEM TARTRATE 5 MG PO TABS: 5.0000 mg | ORAL_TABLET | Freq: Every evening | ORAL | Status: DC | PRN

## 2017-12-19 MED ORDER — DEXAMETHASONE SODIUM PHOSPHATE 10 MG/ML IJ SOLN
10.0000 mg | Freq: Once | INTRAMUSCULAR | Status: AC
  Administered 2017-12-19: 10 mg via INTRAVENOUS

## 2017-12-19 MED ORDER — PROPOFOL 500 MG/50ML IV EMUL
INTRAVENOUS | Status: DC | PRN
  Administered 2017-12-19: 100 ug/kg/min via INTRAVENOUS

## 2017-12-19 MED ORDER — TRANEXAMIC ACID 1000 MG/10ML IV SOLN
1000.0000 mg | Freq: Once | INTRAVENOUS | Status: AC
  Administered 2017-12-19: 1000 mg via INTRAVENOUS
  Filled 2017-12-19: qty 1100

## 2017-12-19 MED ORDER — ACETAMINOPHEN 325 MG PO TABS
325.0000 mg | ORAL_TABLET | Freq: Four times a day (QID) | ORAL | Status: DC | PRN
  Administered 2017-12-20: 650 mg via ORAL
  Filled 2017-12-19: qty 2

## 2017-12-19 MED ORDER — MIDAZOLAM HCL 5 MG/5ML IJ SOLN
INTRAMUSCULAR | Status: DC | PRN
  Administered 2017-12-19: 2 mg via INTRAVENOUS

## 2017-12-19 MED ORDER — METHOCARBAMOL 1000 MG/10ML IJ SOLN
500.0000 mg | Freq: Four times a day (QID) | INTRAMUSCULAR | Status: DC | PRN
  Administered 2017-12-19: 500 mg via INTRAVENOUS
  Filled 2017-12-19: qty 550

## 2017-12-19 MED ORDER — METOCLOPRAMIDE HCL 5 MG PO TABS: 5.0000 mg | ORAL_TABLET | Freq: Three times a day (TID) | ORAL | Status: DC | PRN

## 2017-12-19 MED ORDER — ACETAMINOPHEN 10 MG/ML IV SOLN
1000.0000 mg | Freq: Once | INTRAVENOUS | Status: AC
  Administered 2017-12-19: 1000 mg via INTRAVENOUS
  Filled 2017-12-19: qty 100

## 2017-12-19 MED ORDER — PROPOFOL 10 MG/ML IV BOLUS
INTRAVENOUS | Status: AC
  Filled 2017-12-19: qty 40

## 2017-12-19 MED ORDER — BISACODYL 10 MG RE SUPP: 10.0000 mg | Freq: Every day | RECTAL | Status: DC | PRN

## 2017-12-19 MED ORDER — 0.9 % SODIUM CHLORIDE (POUR BTL) OPTIME
TOPICAL | Status: DC | PRN
  Administered 2017-12-19: 1000 mL

## 2017-12-19 MED ORDER — GLYCOPYRROLATE 0.2 MG/ML IV SOSY
PREFILLED_SYRINGE | INTRAVENOUS | Status: DC | PRN
  Administered 2017-12-19: 0.6 mg via INTRAVENOUS

## 2017-12-19 MED ORDER — FENTANYL CITRATE (PF) 100 MCG/2ML IJ SOLN
INTRAMUSCULAR | Status: DC | PRN
  Administered 2017-12-19 (×2): 50 ug via INTRAVENOUS

## 2017-12-19 MED ORDER — HYDROMORPHONE HCL 1 MG/ML IJ SOLN
0.2500 mg | INTRAMUSCULAR | Status: DC | PRN
  Administered 2017-12-19 (×3): 0.5 mg via INTRAVENOUS

## 2017-12-19 MED ORDER — FLEET ENEMA 7-19 GM/118ML RE ENEM: 1.0000 | ENEMA | Freq: Once | RECTAL | Status: DC | PRN

## 2017-12-19 MED ORDER — BUPIVACAINE HCL (PF) 0.25 % IJ SOLN
INTRAMUSCULAR | Status: DC | PRN
  Administered 2017-12-19: 30 mL

## 2017-12-19 MED ORDER — METOCLOPRAMIDE HCL 5 MG/ML IJ SOLN: 5.0000 mg | Freq: Three times a day (TID) | INTRAMUSCULAR | Status: DC | PRN

## 2017-12-19 MED ORDER — POLYETHYLENE GLYCOL 3350 17 G PO PACK
17.0000 g | PACK | Freq: Every day | ORAL | Status: DC | PRN
  Administered 2017-12-20: 17 g via ORAL
  Filled 2017-12-19: qty 1

## 2017-12-19 MED ORDER — PHENYLEPHRINE HCL 10 MG/ML IJ SOLN
INTRAMUSCULAR | Status: AC
  Filled 2017-12-19: qty 1

## 2017-12-19 MED ORDER — ASPIRIN EC 325 MG PO TBEC
325.0000 mg | DELAYED_RELEASE_TABLET | Freq: Two times a day (BID) | ORAL | Status: DC
  Administered 2017-12-20 – 2017-12-21 (×3): 325 mg via ORAL
  Filled 2017-12-19 (×3): qty 1

## 2017-12-19 MED ORDER — ASPIRIN 325 MG PO TBEC: 325.0000 mg | DELAYED_RELEASE_TABLET | Freq: Two times a day (BID) | ORAL | 0 refills | Status: DC

## 2017-12-19 MED ORDER — PHENYLEPHRINE 40 MCG/ML (10ML) SYRINGE FOR IV PUSH (FOR BLOOD PRESSURE SUPPORT)
PREFILLED_SYRINGE | INTRAVENOUS | Status: DC | PRN
  Administered 2017-12-19: 120 ug via INTRAVENOUS
  Administered 2017-12-19 (×2): 80 ug via INTRAVENOUS
  Administered 2017-12-19: 120 ug via INTRAVENOUS

## 2017-12-19 MED ORDER — DIPHENHYDRAMINE HCL 12.5 MG/5ML PO ELIX: 12.5000 mg | ORAL_SOLUTION | ORAL | Status: DC | PRN

## 2017-12-19 MED ORDER — DEXAMETHASONE SODIUM PHOSPHATE 10 MG/ML IJ SOLN
INTRAMUSCULAR | Status: AC
  Filled 2017-12-19: qty 1

## 2017-12-19 MED ORDER — BUPIVACAINE IN DEXTROSE 0.75-8.25 % IT SOLN
INTRATHECAL | Status: DC | PRN
  Administered 2017-12-19: 1.6 mL via INTRATHECAL

## 2017-12-19 MED ORDER — OXYCODONE HCL 5 MG PO TABS
10.0000 mg | ORAL_TABLET | ORAL | Status: DC | PRN
  Administered 2017-12-19: 15 mg via ORAL
  Administered 2017-12-19: 10 mg via ORAL
  Administered 2017-12-19: 5 mg via ORAL
  Administered 2017-12-20: 10 mg via ORAL
  Filled 2017-12-19: qty 2
  Filled 2017-12-19: qty 3
  Filled 2017-12-19: qty 2
  Filled 2017-12-19: qty 3
  Filled 2017-12-19: qty 2

## 2017-12-19 MED ORDER — ONDANSETRON HCL 4 MG/2ML IJ SOLN
INTRAMUSCULAR | Status: AC
  Filled 2017-12-19: qty 2

## 2017-12-19 MED ORDER — MEPERIDINE HCL 50 MG/ML IJ SOLN: 6.2500 mg | INTRAMUSCULAR | Status: DC | PRN

## 2017-12-19 MED ORDER — METHOCARBAMOL 500 MG PO TABS
500.0000 mg | ORAL_TABLET | Freq: Four times a day (QID) | ORAL | Status: DC | PRN
  Administered 2017-12-20 – 2017-12-21 (×3): 500 mg via ORAL
  Filled 2017-12-19 (×3): qty 1

## 2017-12-19 MED ORDER — VENLAFAXINE HCL ER 150 MG PO CP24
150.0000 mg | ORAL_CAPSULE | Freq: Every day | ORAL | Status: DC
  Administered 2017-12-20 – 2017-12-21 (×2): 150 mg via ORAL
  Filled 2017-12-19 (×2): qty 1

## 2017-12-19 MED ORDER — BUPROPION HCL ER (XL) 150 MG PO TB24
150.0000 mg | ORAL_TABLET | Freq: Every morning | ORAL | Status: DC
  Administered 2017-12-20 – 2017-12-21 (×2): 150 mg via ORAL
  Filled 2017-12-19 (×2): qty 1

## 2017-12-19 MED ORDER — ACETAMINOPHEN 500 MG PO TABS
1000.0000 mg | ORAL_TABLET | Freq: Four times a day (QID) | ORAL | Status: AC
  Administered 2017-12-19 – 2017-12-20 (×4): 1000 mg via ORAL
  Filled 2017-12-19 (×4): qty 2

## 2017-12-19 MED ORDER — OXYCODONE HCL 5 MG PO TABS
5.0000 mg | ORAL_TABLET | ORAL | Status: DC | PRN
  Administered 2017-12-20 – 2017-12-21 (×7): 10 mg via ORAL
  Filled 2017-12-19 (×3): qty 2
  Filled 2017-12-19: qty 1
  Filled 2017-12-19 (×2): qty 2

## 2017-12-19 MED ORDER — PROMETHAZINE HCL 25 MG/ML IJ SOLN: 6.2500 mg | INTRAMUSCULAR | Status: DC | PRN

## 2017-12-19 MED ORDER — FENTANYL CITRATE (PF) 100 MCG/2ML IJ SOLN
INTRAMUSCULAR | Status: AC
  Filled 2017-12-19: qty 2

## 2017-12-19 MED ORDER — PHENOL 1.4 % MT LIQD: 1.0000 | OROMUCOSAL | Status: DC | PRN

## 2017-12-19 MED ORDER — SODIUM CHLORIDE 0.9 % IV SOLN
INTRAVENOUS | Status: DC
  Administered 2017-12-19: 17:00:00 via INTRAVENOUS

## 2017-12-19 MED ORDER — ONDANSETRON HCL 4 MG/2ML IJ SOLN
INTRAMUSCULAR | Status: DC | PRN
  Administered 2017-12-19: 4 mg via INTRAVENOUS

## 2017-12-19 MED ORDER — DEXAMETHASONE SODIUM PHOSPHATE 10 MG/ML IJ SOLN
10.0000 mg | Freq: Once | INTRAMUSCULAR | Status: AC
  Administered 2017-12-20: 10 mg via INTRAVENOUS
  Filled 2017-12-19: qty 1

## 2017-12-19 SURGICAL SUPPLY — 42 items
BAG DECANTER FOR FLEXI CONT (MISCELLANEOUS) ×3 IMPLANT
BAG SPEC THK2 15X12 ZIP CLS (MISCELLANEOUS) ×1
BAG ZIPLOCK 12X15 (MISCELLANEOUS) ×2 IMPLANT
BLADE EXTENDED COATED 6.5IN (ELECTRODE) ×3 IMPLANT
BLADE SAG 18X100X1.27 (BLADE) ×3 IMPLANT
CAPT HIP TOTAL 2 ×2 IMPLANT
CLOSURE WOUND 1/2 X4 (GAUZE/BANDAGES/DRESSINGS) ×1
CLOTH BEACON ORANGE TIMEOUT ST (SAFETY) ×3 IMPLANT
COVER PERINEAL POST (MISCELLANEOUS) ×3 IMPLANT
COVER SURGICAL LIGHT HANDLE (MISCELLANEOUS) ×3 IMPLANT
DECANTER SPIKE VIAL GLASS SM (MISCELLANEOUS) ×3 IMPLANT
DRAPE STERI IOBAN 125X83 (DRAPES) ×3 IMPLANT
DRAPE U-SHAPE 47X51 STRL (DRAPES) ×6 IMPLANT
DRSG ADAPTIC 3X8 NADH LF (GAUZE/BANDAGES/DRESSINGS) ×3 IMPLANT
DRSG MEPILEX BORDER 4X4 (GAUZE/BANDAGES/DRESSINGS) ×3 IMPLANT
DRSG MEPILEX BORDER 4X8 (GAUZE/BANDAGES/DRESSINGS) ×3 IMPLANT
DURAPREP 26ML APPLICATOR (WOUND CARE) ×3 IMPLANT
ELECT REM PT RETURN 15FT ADLT (MISCELLANEOUS) ×3 IMPLANT
EVACUATOR 1/8 PVC DRAIN (DRAIN) ×3 IMPLANT
GLOVE BIO SURGEON STRL SZ8 (GLOVE) ×4 IMPLANT
GLOVE BIOGEL PI IND STRL 6.5 (GLOVE) ×1 IMPLANT
GLOVE BIOGEL PI IND STRL 7.5 (GLOVE) IMPLANT
GLOVE BIOGEL PI IND STRL 8 (GLOVE) ×2 IMPLANT
GLOVE BIOGEL PI INDICATOR 6.5 (GLOVE) ×2
GLOVE BIOGEL PI INDICATOR 7.5 (GLOVE) ×2
GLOVE BIOGEL PI INDICATOR 8 (GLOVE) ×4
GLOVE ECLIPSE 7.5 STRL STRAW (GLOVE) ×2 IMPLANT
GLOVE SURG SS PI 6.5 STRL IVOR (GLOVE) ×3 IMPLANT
GLOVE SURG SS PI 7.5 STRL IVOR (GLOVE) ×2 IMPLANT
GOWN STRL REUS W/TWL 2XL LVL3 (GOWN DISPOSABLE) ×2 IMPLANT
GOWN STRL REUS W/TWL LRG LVL3 (GOWN DISPOSABLE) ×5 IMPLANT
GOWN STRL REUS W/TWL XL LVL3 (GOWN DISPOSABLE) ×3 IMPLANT
PACK ANTERIOR HIP CUSTOM (KITS) ×3 IMPLANT
STRIP CLOSURE SKIN 1/2X4 (GAUZE/BANDAGES/DRESSINGS) ×2 IMPLANT
SUT ETHIBOND NAB CT1 #1 30IN (SUTURE) ×3 IMPLANT
SUT MNCRL AB 4-0 PS2 18 (SUTURE) ×3 IMPLANT
SUT STRATAFIX 0 PDS 27 VIOLET (SUTURE) ×3
SUT VIC AB 2-0 CT1 27 (SUTURE) ×6
SUT VIC AB 2-0 CT1 TAPERPNT 27 (SUTURE) ×2 IMPLANT
SUTURE STRATFX 0 PDS 27 VIOLET (SUTURE) ×1 IMPLANT
TRAY FOLEY CATH 14FR (SET/KITS/TRAYS/PACK) ×2 IMPLANT
YANKAUER SUCT BULB TIP 10FT TU (MISCELLANEOUS) ×3 IMPLANT

## 2017-12-19 NOTE — Transfer of Care (Signed)
Immediate Anesthesia Transfer of Care Note  Patient: [name] [name]  Procedure(s) Performed: Procedure(s): RIGHT TOTAL HIP ARTHROPLASTY ANTERIOR APPROACH (Right)  Patient Location: PACU  Anesthesia Type:Spinal  Level of Consciousness:  sedated, patient cooperative and responds to stimulation  Airway & Oxygen Therapy:Patient Spontanous Breathing and Patient connected to face mask oxgen  Post-op Assessment:  Report given to PACU RN and Post -op Vital signs reviewed and stable  Post vital signs:  Reviewed and stable  Last Vitals:  Vitals:   12/19/17 1151  BP: 134/80  Pulse: 74  Resp: 18  Temp: 36.7 C  SpO2: 97%    Complications: No apparent anesthesia complications

## 2017-12-19 NOTE — Interval H&P Note (Signed)
History and Physical Interval Note:  12/19/2017 12:25 PM  [name] [name]  has presented today for surgery, with the diagnosis of Osteoarthritis Right Hip  The various methods of treatment have been discussed with the patient and family. After consideration of risks, benefits and other options for treatment, the patient has consented to  Procedure(s): RIGHT TOTAL HIP ARTHROPLASTY ANTERIOR APPROACH (Right) as a surgical intervention .  The patient's history has been reviewed, patient examined, no change in status, stable for surgery.  I have reviewed the patient's chart and labs.  Questions were answered to the patient's satisfaction.     Homero Fellers Zenovia Justman

## 2017-12-19 NOTE — Anesthesia Postprocedure Evaluation (Signed)
Anesthesia Post Note  Patient: [name] [name]  Procedure(s) Performed: RIGHT TOTAL HIP ARTHROPLASTY ANTERIOR APPROACH (Right Hip)     Patient location during evaluation: PACU Anesthesia Type: Spinal Level of consciousness: awake and alert Pain management: pain level controlled Vital Signs Assessment: post-procedure vital signs reviewed and stable Respiratory status: spontaneous breathing and respiratory function stable Cardiovascular status: blood pressure returned to baseline and stable Postop Assessment: spinal receding Anesthetic complications: no    Last Vitals:  Vitals:   12/19/17 1716 12/19/17 1825  BP: (!) 159/95 (!) 142/84  Pulse: 80 82  Resp: 14   Temp:  36.8 C  SpO2: 100% 100%    Last Pain:  Vitals:   12/19/17 1825  TempSrc: Oral  PainSc:                  [name]

## 2017-12-19 NOTE — Anesthesia Procedure Notes (Signed)
Spinal  Patient location during procedure: OR Staffing Anesthesiologist: , , MD Resident/CRNA: Alday, Stephen R, CRNA Performed: anesthesiologist  Preanesthetic Checklist Completed: patient identified, site marked, surgical consent, pre-op evaluation, timeout performed, IV checked, risks and benefits discussed and monitors and equipment checked Spinal Block Patient position: sitting Prep: Betadine and site prepped and draped Patient monitoring: heart rate, continuous pulse ox and blood pressure Approach: right paramedian Location: L2-3 Injection technique: single-shot Needle Needle type: Sprotte  Needle gauge: 24 G Needle length: 9 cm Additional Notes Expiration date of kit checked and confirmed. Patient tolerated procedure well, without complications. Attempt by Alday 1st, 2nd attempt by , but quite difficult       

## 2017-12-19 NOTE — Discharge Instructions (Signed)
° °Dr. Frank Aluisio °Total Joint Specialist °[facility] °[address] °[place], [place] [address] °[phone] ° °ANTERIOR APPROACH TOTAL HIP REPLACEMENT POSTOPERATIVE DIRECTIONS ° ° °Hip Rehabilitation, Guidelines Following Surgery  °The results of a hip operation are greatly improved after range of motion and muscle strengthening exercises. Follow all safety measures which are given to protect your hip. If any of these exercises cause increased pain or swelling in your joint, decrease the amount until you are comfortable again. Then slowly increase the exercises. Call your caregiver if you have problems or questions.  ° °HOME CARE INSTRUCTIONS  °Remove items at home which could result in a fall. This includes throw rugs or furniture in walking pathways.  °· ICE to the affected hip every three hours for 30 minutes at a time and then as needed for pain and swelling.  Continue to use ice on the hip for pain and swelling from surgery. You may notice swelling that will progress down to the foot and ankle.  This is normal after surgery.  Elevate the leg when you are not up walking on it.   °· Continue to use the breathing machine which will help keep your temperature down.  It is common for your temperature to cycle up and down following surgery, especially at night when you are not up moving around and exerting yourself.  The breathing machine keeps your lungs expanded and your temperature down. ° ° °DIET °You may resume your previous home diet once your are discharged from the hospital. ° °DRESSING / WOUND CARE / SHOWERING °You may change your dressing every day with sterile gauze.  Please use good hand washing techniques before changing the dressing.  Do not use any lotions or creams on the incision until instructed by your surgeon. °You may start showering once you are discharged home but do not submerge the incision under water. Just pat the incision dry and apply a dry gauze dressing on  daily. °Change the surgical dressing daily and reapply a dry dressing each time. ° °ACTIVITY °Walk with your walker as instructed. °Use walker as long as suggested by your caregivers. °Avoid periods of inactivity such as sitting longer than an hour when not asleep. This helps prevent blood clots.  °You may resume a sexual relationship in one month or when given the OK by your doctor.  °You may return to work once you are cleared by your doctor.  °Do not drive a car for 6 weeks or until released by you surgeon.  °Do not drive while taking narcotics. ° °WEIGHT BEARING °Weight bearing as tolerated with assist device (walker, cane, etc) as directed, use it as long as suggested by your surgeon or therapist, typically at least 4-6 weeks. ° °POSTOPERATIVE CONSTIPATION PROTOCOL °Constipation - defined medically as fewer than three stools per week and severe constipation as less than one stool per week. ° °One of the most common issues patients have following surgery is constipation.  Even if you have a regular bowel pattern at home, your normal regimen is likely to be disrupted due to multiple reasons following surgery.  Combination of anesthesia, postoperative narcotics, change in appetite and fluid intake all can affect your bowels.  In order to avoid complications following surgery, here are some recommendations in order to help you during your recovery period. ° °Colace (docusate) - Pick up an over-the-counter form of Colace or another stool softener and take twice a day as long as you are requiring postoperative pain   medications.  Take with a full glass of water daily.  If you experience loose stools or diarrhea, hold the colace until you stool forms back up.  If your symptoms do not get better within 1 week or if they get worse, check with your doctor. ° °Dulcolax (bisacodyl) - Pick up over-the-counter and take as directed by the product packaging as needed to assist with the movement of your bowels.  Take with a full  glass of water.  Use this product as needed if not relieved by Colace only.  ° °MiraLax (polyethylene glycol) - Pick up over-the-counter to have on hand.  MiraLax is a solution that will increase the amount of water in your bowels to assist with bowel movements.  Take as directed and can mix with a glass of water, juice, soda, coffee, or tea.  Take if you go more than two days without a movement. °Do not use MiraLax more than once per day. Call your doctor if you are still constipated or irregular after using this medication for 7 days in a row. ° °If you continue to have problems with postoperative constipation, please contact the office for further assistance and recommendations.  If you experience "the worst abdominal pain ever" or develop nausea or vomiting, please contact the office immediatly for further recommendations for treatment. ° °ITCHING ° If you experience itching with your medications, try taking only a single pain pill, or even half a pain pill at a time.  You can also use Benadryl over the counter for itching or also to help with sleep.  ° °TED HOSE STOCKINGS °Wear the elastic stockings on both legs for three weeks following surgery during the day but you may remove then at night for sleeping. ° °MEDICATIONS °See your medication summary on the “After Visit Summary” that the nursing staff will review with you prior to discharge.  You may have some home medications which will be placed on hold until you complete the course of blood thinner medication.  It is important for you to complete the blood thinner medication as prescribed by your surgeon.  Continue your approved medications as instructed at time of discharge. ° °PRECAUTIONS °If you experience chest pain or shortness of breath - call 911 immediately for transfer to the hospital emergency department.  °If you develop a fever greater that 101 F, purulent drainage from wound, increased redness or drainage from wound, foul odor from the  wound/dressing, or calf pain - CONTACT YOUR SURGEON.   °                                                °FOLLOW-UP APPOINTMENTS °Make sure you keep all of your appointments after your operation with your surgeon and caregivers. You should call the office at the above phone number and make an appointment for approximately two weeks after the date of your surgery or on the date instructed by your surgeon outlined in the "After Visit Summary". ° °RANGE OF MOTION AND STRENGTHENING EXERCISES  °These exercises are designed to help you keep full movement of your hip joint. Follow your caregiver's or physical therapist's instructions. Perform all exercises about fifteen times, three times per day or as directed. Exercise both hips, even if you have had only one joint replacement. These exercises can be done on a training (exercise) mat, on the floor, on   a table or on a bed. Use whatever works the best and is most comfortable for you. Use music or television while you are exercising so that the exercises are a pleasant break in your day. This will make your life better with the exercises acting as a break in routine you can look forward to.  °Lying on your back, slowly slide your foot toward your buttocks, raising your knee up off the floor. Then slowly slide your foot back down until your leg is straight again.  °Lying on your back spread your legs as far apart as you can without causing discomfort.  °Lying on your side, raise your upper leg and foot straight up from the floor as far as is comfortable. Slowly lower the leg and repeat.  °Lying on your back, tighten up the muscle in the front of your thigh (quadriceps muscles). You can do this by keeping your leg straight and trying to raise your heel off the floor. This helps strengthen the largest muscle supporting your knee.  °Lying on your back, tighten up the muscles of your buttocks both with the legs straight and with the knee bent at a comfortable angle while keeping  your heel on the floor.  ° °IF YOU ARE TRANSFERRED TO A SKILLED REHAB FACILITY °If the patient is transferred to a skilled rehab facility following release from the hospital, a list of the current medications will be sent to the facility for the patient to continue.  When discharged from the skilled rehab facility, please have the facility set up the patient's Home Health Physical Therapy prior to being released. Also, the skilled facility will be responsible for providing the patient with their medications at time of release from the facility to include their pain medication, the muscle relaxants, and their blood thinner medication. If the patient is still at the rehab facility at time of the two week follow up appointment, the skilled rehab facility will also need to assist the patient in arranging follow up appointment in our office and any transportation needs. ° °MAKE SURE YOU:  °Understand these instructions.  °Get help right away if you are not doing well or get worse.  ° ° °Pick up stool softner and laxative for home use following surgery while on pain medications. °Do not submerge incision under water. °Please use good hand washing techniques while changing dressing each day. °May shower starting three days after surgery. °Please use a clean towel to pat the incision dry following showers. °Continue to use ice for pain and swelling after surgery. °Do not use any lotions or creams on the incision until instructed by your surgeon. °

## 2017-12-19 NOTE — Op Note (Signed)
OPERATIVE REPORT- TOTAL HIP ARTHROPLASTY   PREOPERATIVE DIAGNOSIS: Osteoarthritis of the Right hip.   POSTOPERATIVE DIAGNOSIS: Osteoarthritis of the Right  hip.   PROCEDURE: Right total hip arthroplasty, anterior approach.   SURGEON: Ollen Gross, MD   ASSISTANT: Dimitri Ped, PA-C  ANESTHESIA:  Spinal  ESTIMATED BLOOD LOSS:-350 mL    DRAINS: Hemovac x1.   COMPLICATIONS: None   CONDITION: PACU - hemodynamically stable.   BRIEF CLINICAL NOTE: [name] [name] is a 62 y.o. female who has advanced end-  stage arthritis of their Right  hip with progressively worsening pain and  dysfunction.The patient has failed nonoperative management and presents for  total hip arthroplasty.   PROCEDURE IN DETAIL: After successful administration of spinal  anesthetic, the traction boots for the [facility] bed were placed on both  feet and the patient was placed onto the [facility] bed, boots placed into the leg  holders. The Right hip was then isolated from the perineum with plastic  drapes and prepped and draped in the usual sterile fashion. ASIS and  greater trochanter were marked and a oblique incision was made, starting  at about 1 cm lateral and 2 cm distal to the ASIS and coursing towards  the anterior cortex of the femur. The skin was cut with a 10 blade  through subcutaneous tissue to the level of the fascia overlying the  tensor fascia lata muscle. The fascia was then incised in line with the  incision at the junction of the anterior third and posterior 2/3rd. The  muscle was teased off the fascia and then the interval between the TFL  and the rectus was developed. The Hohmann retractor was then placed at  the top of the femoral neck over the capsule. The vessels overlying the  capsule were cauterized and the fat on top of the capsule was removed.  A Hohmann retractor was then placed anterior underneath the rectus  femoris to give exposure to the entire anterior capsule. A T-shaped   capsulotomy was performed. The edges were tagged and the femoral head  was identified.       Osteophytes are removed off the superior acetabulum.  The femoral neck was then cut in situ with an oscillating saw. Traction  was then applied to the left lower extremity utilizing the [facility]  traction. The femoral head was then removed. Retractors were placed  around the acetabulum and then circumferential removal of the labrum was  performed. Osteophytes were also removed. Reaming starts at 45 mm to  medialize and  Increased in 2 mm increments to 49 mm. We reamed in  approximately 40 degrees of abduction, 20 degrees anteversion. A 50 mm  pinnacle acetabular shell was then impacted in anatomic position under  fluoroscopic guidance with excellent purchase. We did not need to place  any additional dome screws. A 32 mm neutral + 4 marathon liner was then  placed into the acetabular shell.       The femoral lift was then placed along the lateral aspect of the femur  just distal to the vastus ridge. The leg was  externally rotated and capsule  was stripped off the inferior aspect of the femoral neck down to the  level of the lesser trochanter, this was done with electrocautery. The femur was lifted after this was performed. The  leg was then placed in an extended and adducted position essentially delivering the femur. We also removed the capsule superiorly and the piriformis from the piriformis  fossa to gain excellent exposure of the  proximal femur. Rongeur was used to remove some cancellous bone to get  into the lateral portion of the proximal femur for placement of the  initial starter reamer. The starter broaches was placed  the starter broach  and was shown to go down the center of the canal. Broaching  with the  Corail system was then performed starting at size 8, coursing  Up to size 9. A size 9 had excellent torsional and rotational  and axial stability. The trial standard offset neck was then  placed  with a 32 + 1 trial head. The hip was then reduced. We confirmed that  the stem was in the canal both on AP and lateral x-rays. It also has excellent sizing. The hip was reduced with outstanding stability through full extension and full external rotation.. AP pelvis was taken and the leg lengths were measured and found to be equal. Hip was then dislocated again and the femoral head and neck removed. The  femoral broach was removed. Size 9 Corail stem with a standard offset  neck was then impacted into the femur following native anteversion. Has  excellent purchase in the canal. Excellent torsional and rotational and  axial stability. It is confirmed to be in the canal on AP and lateral  fluoroscopic views. The 32 + 1 ceramic head was placed and the hip  reduced with outstanding stability. Again AP pelvis was taken and it  confirmed that the leg lengths were equal. The wound was then copiously  irrigated with saline solution and the capsule reattached and repaired  with Ethibond suture. 30 ml of .25% Bupivicaine was  injected into the capsule and into the edge of the tensor fascia lata as well as subcutaneous tissue. The fascia overlying the tensor fascia lata was then closed with a running #1 V-Loc. Subcu was closed with interrupted 2-0 Vicryl and subcuticular running 4-0 Monocryl. Incision was cleaned  and dried. Steri-Strips and a bulky sterile dressing applied. Hemovac  drain was hooked to suction and then the patient was awakened and transported to  recovery in stable condition.        Please note that a surgical assistant was a medical necessity for this procedure to perform it in a safe and expeditious manner. Assistant was necessary to provide appropriate retraction of vital neurovascular structures and to prevent femoral fracture and allow for anatomic placement of the prosthesis.  Ollen Gross, M.D.

## 2017-12-19 NOTE — Anesthesia Preprocedure Evaluation (Addendum)
Anesthesia Evaluation  Patient identified by MRN, date of birth, ID band Patient awake    Reviewed: Allergy & Precautions, H&P , NPO status , Patient's Chart, lab work & pertinent test results  Airway Mallampati: II  TM Distance: >3 FB Neck ROM: Full    Dental no notable dental hx.    Pulmonary sleep apnea and Continuous Positive Airway Pressure Ventilation , former smoker,    Pulmonary exam normal breath sounds clear to auscultation       Cardiovascular negative cardio ROS Normal cardiovascular exam Rhythm:Regular Rate:Normal     Neuro/Psych PSYCHIATRIC DISORDERS Anxiety Depression negative neurological ROS     GI/Hepatic negative GI ROS, Neg liver ROS,   Endo/Other    Renal/GU negative Renal ROS     Musculoskeletal  (+) Arthritis ,   Abdominal   Peds  Hematology negative hematology ROS (+)   Anesthesia Other Findings   Reproductive/Obstetrics negative OB ROS                             Anesthesia Physical  Anesthesia Plan  ASA: III  Anesthesia Plan: Spinal   Post-op Pain Management:    Induction:   PONV Risk Score and Plan: 2 and Ondansetron and Propofol infusion  Airway Management Planned: Simple Face Mask  Additional Equipment:   Intra-op Plan:   Post-operative Plan:   Informed Consent: I have reviewed the patients History and Physical, chart, labs and discussed the procedure including the risks, benefits and alternatives for the proposed anesthesia with the patient or authorized representative who has indicated his/her understanding and acceptance.   Dental advisory given  Plan Discussed with: CRNA  Anesthesia Plan Comments:        Anesthesia Quick Evaluation

## 2017-12-20 LAB — CBC
HEMATOCRIT: 31.4 % — AB (ref 36.0–46.0)
HEMOGLOBIN: 10.1 g/dL — AB (ref 12.0–15.0)
MCH: 31.4 pg (ref 26.0–34.0)
MCHC: 32.2 g/dL (ref 30.0–36.0)
MCV: 97.5 fL (ref 78.0–100.0)
Platelets: 275 10*3/uL (ref 150–400)
RBC: 3.22 MIL/uL — AB (ref 3.87–5.11)
RDW: 13.2 % (ref 11.5–15.5)
WBC: 11 10*3/uL — ABNORMAL HIGH (ref 4.0–10.5)

## 2017-12-20 LAB — BASIC METABOLIC PANEL
Anion gap: 8 (ref 5–15)
BUN: 9 mg/dL (ref 6–20)
CHLORIDE: 104 mmol/L (ref 101–111)
CO2: 28 mmol/L (ref 22–32)
CREATININE: 0.54 mg/dL (ref 0.44–1.00)
Calcium: 9 mg/dL (ref 8.9–10.3)
GFR calc Af Amer: >60 mL/min (ref >60)
GFR calc non Af Amer: >60 mL/min (ref >60)
Glucose, Bld: 128 mg/dL — ABNORMAL HIGH (ref 65–99)
POTASSIUM: 4.5 mmol/L (ref 3.5–5.1)
SODIUM: 140 mmol/L (ref 135–145)

## 2017-12-20 MED ORDER — OXYCODONE HCL 5 MG PO TABS: 5.0000 mg | ORAL_TABLET | ORAL | 0 refills | Status: DC | PRN

## 2017-12-20 NOTE — Progress Notes (Addendum)
   Subjective: 1 Day Post-Op Procedure(s) (LRB): RIGHT TOTAL HIP ARTHROPLASTY ANTERIOR APPROACH (Right) Patient reports pain as moderate.   Patient seen in rounds with Dr. Lequita Halt. Patient is well, and has had no acute complaints or problems. Voiding and positive flatus. Denies nausea/vomiting. We will start therapy today.  Plan is to go Home after hospital stay.  Objective: Vital signs in last 24 hours: Temp:  [97.3 F (36.3 C)-98.5 F (36.9 C)] 98 F (36.7 C) (05/16 0548) Pulse Rate:  [61-82] 61 (05/16 0548) Resp:  [13-21] 20 (05/16 0548) BP: (104-159)/(61-95) 128/71 (05/16 0548) SpO2:  [97 %-100 %] 99 % (05/16 0548) Weight:  [78.9 kg (174 lb)] 78.9 kg (174 lb) (05/15 1143)  Intake/Output from previous day:  Intake/Output Summary (Last 24 hours) at 12/20/2017 0725 Last data filed at 12/20/2017 0500 Gross per 24 hour  Intake 3908.75 ml  Output 4075 ml  Net -166.25 ml    Labs: Recent Labs    12/20/17 0542  HGB 10.1*   Recent Labs    12/20/17 0542  WBC 11.0*  RBC 3.22*  HCT 31.4*  PLT 275   Recent Labs    12/20/17 0542  NA 140  K 4.5  CL 104  CO2 28  BUN 9  CREATININE 0.54  GLUCOSE 128*  CALCIUM 9.0   EXAM General - Patient is Alert, Appropriate and Oriented Extremity - Neurovascular intact Sensation intact distally Intact pulses distally Dorsiflexion/Plantar flexion intact Dressing - dressing C/D/I Motor Function - intact, moving foot and toes well on exam.  Hemovac pulled without difficulty.  Past Medical History:  Diagnosis Date  . Anxiety   . Deafness    Wears Bilat hearing aids since Childhood  . Environmental and seasonal allergies   . Glaucoma, both eyes   . Hyperlipidemia   . Major depressive disorder   . Mild cognitive impairment, so stated 07/16/2013   Patient subjectively complains of word finding and memory deficits. MOCA 30-30 on 07-16-13 , paraphrasic errors- uses words such as  closet for garage   . OA (osteoarthritis)    "all  over"  . Obstructive sleep apnea treated with BiPAP   . Right arm weakness    upper due to shoulder issues  . Wears glasses   . Wears hearing aid in both ears     Assessment/Plan: 1 Day Post-Op Procedure(s) (LRB): RIGHT TOTAL HIP ARTHROPLASTY ANTERIOR APPROACH (Right) Active Problems:   OA (osteoarthritis) of hip  Estimated body mass index is 33.98 kg/m as calculated from the following:   Height as of this encounter: 5' (1.524 m).   Weight as of this encounter: 78.9 kg (174 lb). Advance diet Up with therapy  DVT Prophylaxis - Aspirin Weight Bearing As Tolerated Hemovac Pulled Will begin therapy today with possible discharge to home with HHPT pending progress.   Dimitri Ped, PA-C Orthopaedic Surgery 12/20/2017, 7:25 AM

## 2017-12-20 NOTE — Progress Notes (Addendum)
Discharge planning, spoke with patient at beside. Chose [facility] for [facility] services, PT to eval and treat. Contacted [facility] for referral. Has RW and 3-n-1. [phone]

## 2017-12-20 NOTE — Progress Notes (Signed)
Physical Therapy Treatment Patient Details Name: [name] [name] MRN: [id number] DOB: [birth year] Today's Date: 12/20/2017    History of Present Illness 62 yo female s/p R DA THA , S/O  L THA-DA 09/24/17. Hx of bil TKA 2008, hearing impaired (bil hearing aids), OSA, obesity.     PT Comments    The patient is progressing well. Plans Dc tomorrow after therapy.    Follow Up Recommendations  Home health PT     Equipment Recommendations  None recommended by PT    Recommendations for Other Services       Precautions / Restrictions Precautions Precautions: Fall    Mobility  Bed Mobility Overal bed mobility: Needs Assistance Bed Mobility: Supine to Sit;Sit to Supine     Supine to sit: Min assist Sit to supine: Min assist   General bed mobility comments: assist with right leg  Transfers Overall transfer level: Needs assistance Equipment used: Rolling walker (2 wheeled) Transfers: Sit to/from Stand Sit to Stand: Min guard         General transfer comment: cues for hand and right leg  Ambulation/Gait Ambulation/Gait assistance: Min assist Ambulation Distance (Feet): 90 Feet Assistive device: Rolling walker (2 wheeled) Gait Pattern/deviations: Step-to pattern;Step-through pattern     General Gait Details: cues for sequence and posture   Stairs             Wheelchair Mobility    Modified Rankin (Stroke Patients Only)       Balance                                            Cognition Arousal/Alertness: Awake/alert Behavior During Therapy: WFL for tasks assessed/performed Overall Cognitive Status: Within Functional Limits for tasks assessed                                        Exercises Total Joint Exercises Ankle Circles/Pumps: AROM;Both;10 reps Quad Sets: AROM;Both;10 reps Short Arc Quad: AROM;Right;10 reps Heel Slides: AAROM;Right;10 reps Hip ABduction/ADduction: AAROM;Right;10 reps Long Arc Quad:  AROM;Right;10 reps    General Comments        Pertinent Vitals/Pain Pain Assessment: 0-10 Pain Score: 6  Pain Location: right  thigh Pain Descriptors / Indicators: Burning;Discomfort;Grimacing;Guarding Pain Intervention(s): Monitored during session;Premedicated before session;Ice applied    Home Living Family/patient expects to be discharged to:: Private residence Living Arrangements: Spouse/significant other Available Help at Discharge: Family;Available PRN/intermittently Type of Home: House Home Access: Stairs to enter Entrance Stairs-Rails: Right Home Layout: One level Home Equipment: Walker - 2 wheels;Cane - single point;Bedside commode      Prior Function Level of Independence: Independent          PT Goals (current goals can now be found in the care plan section) Acute Rehab PT Goals Patient Stated Goal: to go home PT Goal Formulation: With patient Time For Goal Achievement: 12/24/17 Potential to Achieve Goals: Good Progress towards PT goals: Progressing toward goals    Frequency    7X/week      PT Plan Current plan remains appropriate    Co-evaluation              AM-PAC PT "6 Clicks" Daily Activity  Outcome Measure  Difficulty turning over in bed (including adjusting bedclothes, sheets and blankets)?: A Lot Difficulty  moving from lying on back to sitting on the side of the bed? : A Lot Difficulty sitting down on and standing up from a chair with arms (e.g., wheelchair, bedside commode, etc,.)?: A Lot Help needed moving to and from a bed to chair (including a wheelchair)?: A Lot Help needed walking in hospital room?: A Lot Help needed climbing 3-5 steps with a railing? : Total 6 Click Score: 11    End of Session   Activity Tolerance: Patient tolerated treatment well Patient left: in bed;with bed alarm set;with call bell/phone within reach Nurse Communication: Mobility status PT Visit Diagnosis: Difficulty in walking, not elsewhere  classified (R26.2);Pain Pain - Right/Left: Right Pain - part of body: Hip     Time: 1610-9604 PT Time Calculation (min) (ACUTE ONLY): 56 min  Charges:                    G CodesBlanchard Kelch PT [phone]   [name] 12/20/2017, 4:14 PM

## 2017-12-20 NOTE — Evaluation (Signed)
Physical Therapy Evaluation Patient Details Name: [name] [name] MRN: [id number] DOB: 03/10/1956 Today's Date: 12/20/2017   History of Present Illness  62 yo female s/p R DA THA , S/O  L THA-DA 09/24/17. Hx of bil TKA 2008, hearing impaired (bil hearing aids), OSA, obesity.   Clinical Impression  The patient reports having increased pain after ambulating. The patient does report that she will be home alone tomorrow as family will be working and patient was anticipating DC tomorrow. Pt admitted with above diagnosis. Pt currently with functional limitations due to the deficits listed below (see PT Problem List).  Pt will benefit from skilled PT to increase their independence and safety with mobility to allow discharge to the venue listed below.       Follow Up Recommendations Home health PT    Equipment Recommendations  None recommended by PT    Recommendations for Other Services       Precautions / Restrictions Precautions Precautions: Fall      Mobility  Bed Mobility               General bed mobility comments: up with CNA to BR.  Transfers Overall transfer level: Needs assistance Equipment used: Rolling walker (2 wheeled) Transfers: Sit to/from Stand Sit to Stand: Min guard         General transfer comment: from [facility]  Ambulation/Gait Ambulation/Gait assistance: Min assist Ambulation Distance (Feet): 60 Feet Assistive device: Rolling walker (2 wheeled) Gait Pattern/deviations: Step-to pattern;Step-through pattern;Antalgic;Decreased step length - right;Decreased stance time - left     General Gait Details: cues for sequence and posture  Stairs            Wheelchair Mobility    Modified Rankin (Stroke Patients Only)       Balance                                             Pertinent Vitals/Pain Pain Assessment: 0-10 Pain Score: 8  Pain Location: right  thigh Pain Descriptors / Indicators:  Burning;Discomfort;Grimacing;Guarding Pain Intervention(s): Monitored during session;Premedicated before session;Patient requesting pain meds-RN notified;Limited activity within patient's tolerance;Ice applied    Home Living Family/patient expects to be discharged to:: Private residence Living Arrangements: Spouse/significant other Available Help at Discharge: Family;Available PRN/intermittently Type of Home: House Home Access: Stairs to enter Entrance Stairs-Rails: Right Entrance Stairs-Number of Steps: 4 Home Layout: One level Home Equipment: Walker - 2 wheels;Cane - single point;Bedside commode      Prior Function Level of Independence: Independent               Hand Dominance        Extremity/Trunk Assessment   Upper Extremity Assessment Upper Extremity Assessment: RUE deficits/detail RUE Deficits / Details: decreased active shoulder elevation    Lower Extremity Assessment Lower Extremity Assessment: RLE deficits/detail RLE Deficits / Details: able to advance the right leg.    Cervical / Trunk Assessment Cervical / Trunk Assessment: Normal  Communication   Communication: No difficulties  Cognition Arousal/Alertness: Awake/alert Behavior During Therapy: WFL for tasks assessed/performed Overall Cognitive Status: Within Functional Limits for tasks assessed                                        General Comments  Exercises     Assessment/Plan    PT Assessment Patient needs continued PT services  PT Problem List Decreased strength;Decreased range of motion;Decreased activity tolerance;Decreased mobility;Decreased knowledge of precautions;Decreased safety awareness;Decreased knowledge of use of DME       PT Treatment Interventions DME instruction;Therapeutic exercise;Gait training;Stair training;Functional mobility training;Therapeutic activities;Patient/family education    PT Goals (Current goals can be found in the Care Plan  section)  Acute Rehab PT Goals Patient Stated Goal: to go home PT Goal Formulation: With patient Time For Goal Achievement: 12/24/17 Potential to Achieve Goals: Good    Frequency 7X/week   Barriers to discharge Decreased caregiver support      Co-evaluation               AM-PAC PT "6 Clicks" Daily Activity  Outcome Measure Difficulty turning over in bed (including adjusting bedclothes, sheets and blankets)?: Unable Difficulty moving from lying on back to sitting on the side of the bed? : Unable Difficulty sitting down on and standing up from a chair with arms (e.g., wheelchair, bedside commode, etc,.)?: Unable Help needed moving to and from a bed to chair (including a wheelchair)?: Total Help needed walking in hospital room?: Total Help needed climbing 3-5 steps with a railing? : Total 6 Click Score: 6    End of Session   Activity Tolerance: Patient tolerated treatment well Patient left: in chair;with call bell/phone within reach;with chair alarm set Nurse Communication: Mobility status PT Visit Diagnosis: Difficulty in walking, not elsewhere classified (R26.2);Pain Pain - Right/Left: Right Pain - part of body: Hip    Time: 0835-0900 PT Time Calculation (min) (ACUTE ONLY): 25 min   Charges:   PT Evaluation $PT Eval Low Complexity: 1 Low PT Treatments $Gait Training: 8-22 mins   PT G CodesBlanchard Kelch PT [phone]  [name] 12/20/2017, 1:32 PM

## 2017-12-21 LAB — BASIC METABOLIC PANEL
ANION GAP: 9 (ref 5–15)
BUN: 13 mg/dL (ref 6–20)
CO2: 28 mmol/L (ref 22–32)
Calcium: 8.8 mg/dL — ABNORMAL LOW (ref 8.9–10.3)
Chloride: 104 mmol/L (ref 101–111)
Creatinine, Ser: 0.58 mg/dL (ref 0.44–1.00)
GFR calc Af Amer: >60 mL/min (ref >60)
GFR calc non Af Amer: >60 mL/min (ref >60)
Glucose, Bld: 105 mg/dL — ABNORMAL HIGH (ref 65–99)
POTASSIUM: 3.8 mmol/L (ref 3.5–5.1)
Sodium: 141 mmol/L (ref 135–145)

## 2017-12-21 LAB — CBC
HEMATOCRIT: 28.1 % — AB (ref 36.0–46.0)
Hemoglobin: 9 g/dL — ABNORMAL LOW (ref 12.0–15.0)
MCH: 31.3 pg (ref 26.0–34.0)
MCHC: 32 g/dL (ref 30.0–36.0)
MCV: 97.6 fL (ref 78.0–100.0)
Platelets: 227 10*3/uL (ref 150–400)
RBC: 2.88 MIL/uL — AB (ref 3.87–5.11)
RDW: 13.6 % (ref 11.5–15.5)
WBC: 12.5 10*3/uL — AB (ref 4.0–10.5)

## 2017-12-21 NOTE — Progress Notes (Signed)
   Subjective: 2 Days Post-Op Procedure(s) (LRB): RIGHT TOTAL HIP ARTHROPLASTY ANTERIOR APPROACH (Right) Patient reports pain as mild.   Patient seen in rounds with Dr. Lequita Halt. Patient is well, and has had no acute complaints or problems.  Did well with therapy yesterday, will resume today.  Plan is to go Home after hospital stay.  Objective: Vital signs in last 24 hours: Temp:  [98.4 F (36.9 C)-99.1 F (37.3 C)] 99.1 F (37.3 C) (05/17 0554) Pulse Rate:  [62-80] 80 (05/17 0554) Resp:  [16-19] 19 (05/17 0554) BP: (104-136)/(73-90) 136/84 (05/17 0554) SpO2:  [97 %-98 %] 98 % (05/17 0554)  Intake/Output from previous day:  Intake/Output Summary (Last 24 hours) at 12/21/2017 0714 Last data filed at 12/21/2017 0606 Gross per 24 hour  Intake 1395 ml  Output 2300 ml  Net -905 ml    Labs: Recent Labs    12/20/17 0542 12/21/17 0521  HGB 10.1* 9.0*   Recent Labs    12/20/17 0542 12/21/17 0521  WBC 11.0* 12.5*  RBC 3.22* 2.88*  HCT 31.4* 28.1*  PLT 275 227   Recent Labs    12/20/17 0542 12/21/17 0521  NA 140 141  K 4.5 3.8  CL 104 104  CO2 28 28  BUN 9 13  CREATININE 0.54 0.58  GLUCOSE 128* 105*  CALCIUM 9.0 8.8*   EXAM General - Patient is Alert, Appropriate and Oriented Extremity - Neurologically intact Neurovascular intact Sensation intact distally Dressing - dressing C/D/I Motor Function - intact, moving foot and toes well on exam.   Past Medical History:  Diagnosis Date  . Anxiety   . Deafness    Wears Bilat hearing aids since Childhood  . Environmental and seasonal allergies   . Glaucoma, both eyes   . Hyperlipidemia   . Major depressive disorder   . Mild cognitive impairment, so stated 07/16/2013   Patient subjectively complains of word finding and memory deficits. MOCA 30-30 on 07-16-13 , paraphrasic errors- uses words such as  closet for garage   . OA (osteoarthritis)    "all over"  . Obstructive sleep apnea treated with BiPAP   . Right  arm weakness    upper due to shoulder issues  . Wears glasses   . Wears hearing aid in both ears     Assessment/Plan: 2 Days Post-Op Procedure(s) (LRB): RIGHT TOTAL HIP ARTHROPLASTY ANTERIOR APPROACH (Right) Active Problems:   OA (osteoarthritis) of hip  Estimated body mass index is 33.98 kg/m as calculated from the following:   Height as of this encounter: 5' (1.524 m).   Weight as of this encounter: 78.9 kg (174 lb). Up with therapy D/C IV fluids  DVT Prophylaxis - Aspirin Weight Bearing As Tolerated  Plan for D/C home today with HHPT after one session of therapy this AM.  Dimitri Ped, PA-C Orthopaedic Surgery 12/21/2017, 7:14 AM

## 2017-12-21 NOTE — Progress Notes (Signed)
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 Hx of bil TKA 2008, hearing impaired (bil hearing aids), OSA, obesity.     PT Comments    Ready for DC.   Follow Up Recommendations  Home health PT     Equipment Recommendations  None recommended by PT    Recommendations for Other Services       Precautions / Restrictions Precautions Precautions: Fall    Mobility  Bed Mobility Overal bed mobility: Needs Assistance Bed Mobility: Supine to Sit;Sit to Supine           General bed mobility comments: PATIENT USED BELT AROUND BOTH FEET TO SELF ASSIST LEGS ONTO AND OFF BED.  Transfers Overall transfer level: Needs assistance Equipment used: Rolling walker (2 wheeled) Transfers: Sit to/from Stand Sit to Stand: Supervision         General transfer comment: cues for hand and right leg  Ambulation/Gait Ambulation/Gait assistance: Supervision Ambulation Distance (Feet): 50 Feet Assistive device: Rolling walker (2 wheeled) Gait Pattern/deviations: Step-to pattern;Step-through pattern     General Gait Details: cues for sequence and posture   Stairs Stairs: Yes Stairs assistance: Min assist Stair Management: One rail Left;Step to pattern;Forwards;With cane Number of Stairs: 4 General stair comments: cues for technique   Wheelchair Mobility    Modified Rankin (Stroke Patients Only)       Balance                                            Cognition Arousal/Alertness: Awake/alert                                            Exercises    General Comments        Pertinent Vitals/Pain Pain Score: 6  Pain Location: right  thigh Pain Descriptors / Indicators: Burning;Cramping;Sharp Pain Intervention(s): Monitored during session;Patient requesting pain meds-RN  notified    Home Living                      Prior Function            PT Goals (current goals can now be found in the care plan section) Progress towards PT goals: Progressing toward goals    Frequency    7X/week      PT Plan Current plan remains appropriate    Co-evaluation              AM-PAC PT "6 Clicks" Daily Activity  Outcome Measure  Difficulty turning over in bed (including adjusting bedclothes, sheets and blankets)?: A Little Difficulty moving from lying on back to sitting on the side of the bed? : A Little Difficulty sitting down on and standing up from a chair with arms (e.g., wheelchair, bedside commode, etc,.)?: A Little Help needed moving to and from a bed to chair (including a wheelchair)?: A Little Help needed walking in hospital room?: A Little Help needed climbing 3-5 steps with a railing? : A Lot 6 Click Score: 17    End of Session   Activity Tolerance: Patient tolerated treatment well Patient left: in  bed;with call bell/phone within reach;with family/visitor present Nurse Communication: Mobility status PT Visit Diagnosis: Difficulty in walking, not elsewhere classified (R26.2);Pain     Time: 1130-1200 PT Time Calculation (min) (ACUTE ONLY): 30 min  Charges:  $Gait Training: 23-37 mins                     G Codes:          [name] 12/21/2017, 1:26 PM

## 2017-12-21 NOTE — Discharge Summary (Signed)
Physician Discharge Summary   Patient ID: [name] [name] MRN: [id number] DOB/AGE: 09/13/1955 62 y.o.  Admit date: 12/19/2017 Discharge date: 12/21/2017  Primary Diagnosis: Primary osteoarthritis right hip   Admission Diagnoses:  Past Medical History:  Diagnosis Date  . Anxiety   . Deafness    Wears Bilat hearing aids since Childhood  . Environmental and seasonal allergies   . Glaucoma, both eyes   . Hyperlipidemia   . Major depressive disorder   . Mild cognitive impairment, so stated 07/16/2013   Patient subjectively complains of word finding and memory deficits. MOCA 30-30 on 07-16-13 , paraphrasic errors- uses words such as  closet for garage   . OA (osteoarthritis)    "all over"  . Obstructive sleep apnea treated with BiPAP   . Right arm weakness    upper due to shoulder issues  . Wears glasses   . Wears hearing aid in both ears    Discharge Diagnoses:   Active Problems:   OA (osteoarthritis) of hip  Estimated body mass index is 33.98 kg/m as calculated from the following:   Height as of this encounter: 5' (1.524 m).   Weight as of this encounter: 78.9 kg (174 lb).  Procedure(s) (LRB): RIGHT TOTAL HIP ARTHROPLASTY ANTERIOR APPROACH (Right)   Consults: None  HPI: [name] has had pain for over a year but has become severe in the past several months. It is hurting at all times including day and night. It is unrelenting and she cannot get comfortable. She had recent x-rays and was told she had arthritis or avascular necrosis. Both hips hurt but the left was the worst of the two. She has recently underwent the left hip replacement and has recovered very well. She does have pain in her RIGHT hip still which continues to affect how she walks. Radiographs, AP and lateral of both knees show both prostheses in excellent position with no periprosthetic abnormalities. Hip x-rays that she had elsewhere showed and she has severe end-stage arthritis of the LEFT worse than RIGHT hip (she  has since had the left hip replaced). The RIGHT hip side shows bone-on-bone without erosion. The patient has significant pain and dysfunction in their hip. They have significant functional limitations and have failed nonoperative management. At this point the most predictable means of improving pain and function is total hip arthroplasty. We discussed the procedure risks, potential complication and rehab course in detail. At this time the patient would like to go ahead and proceed with the total hip arthroplasty. Risks and benefits have been discussed with the patient and she elects to proceed with surgery at this time. She has done well with the left hip and now wishes to proceed with the right total hip.    Laboratory Data: Admission on 12/19/2017, Discharged on 12/21/2017  Component Date Value Ref Range Status  . WBC 12/20/2017 11.0* 4.0 - 10.5 K/uL Final  . RBC 12/20/2017 3.22* 3.87 - 5.11 MIL/uL Final  . Hemoglobin 12/20/2017 10.1* 12.0 - 15.0 g/dL Final  . HCT 12/20/2017 31.4* 36.0 - 46.0 % Final  . MCV 12/20/2017 97.5  78.0 - 100.0 fL Final  . MCH 12/20/2017 31.4  26.0 - 34.0 pg Final  . MCHC 12/20/2017 32.2  30.0 - 36.0 g/dL Final  . RDW 12/20/2017 13.2  11.5 - 15.5 % Final  . Platelets 12/20/2017 275  150 - 400 K/uL Final   Performed at [facility], [place] [address] [place], [place] [address]  . Sodium  12/20/2017 140  135 - 145 mmol/L Final  . Potassium 12/20/2017 4.5  3.5 - 5.1 mmol/L Final  . Chloride 12/20/2017 104  101 - 111 mmol/L Final  . CO2 12/20/2017 28  22 - 32 mmol/L Final  . Glucose, Bld 12/20/2017 128* 65 - 99 mg/dL Final  . BUN 12/20/2017 9  6 - 20 mg/dL Final  . Creatinine, Ser 12/20/2017 0.54  0.44 - 1.00 mg/dL Final  . Calcium 12/20/2017 9.0  8.9 - 10.3 mg/dL Final  . GFR calc non Af Amer 12/20/2017 >60  >60 mL/min Final  . GFR calc Af Amer 12/20/2017 >60  >60 mL/min Final   Comment: (NOTE) The eGFR has been calculated using the CKD EPI  equation. This calculation has not been validated in all clinical situations. eGFR's persistently <60 mL/min signify possible Chronic Kidney Disease.   Georgiann Hahn gap 12/20/2017 8  5 - 15 Final   Performed at [facility], [place] [address], [place], [place] [address]  . WBC 12/21/2017 12.5* 4.0 - 10.5 K/uL Final  . RBC 12/21/2017 2.88* 3.87 - 5.11 MIL/uL Final  . Hemoglobin 12/21/2017 9.0* 12.0 - 15.0 g/dL Final  . HCT 12/21/2017 28.1* 36.0 - 46.0 % Final  . MCV 12/21/2017 97.6  78.0 - 100.0 fL Final  . MCH 12/21/2017 31.3  26.0 - 34.0 pg Final  . MCHC 12/21/2017 32.0  30.0 - 36.0 g/dL Final  . RDW 12/21/2017 13.6  11.5 - 15.5 % Final  . Platelets 12/21/2017 227  150 - 400 K/uL Final   Performed at [facility], [place] [address] [place], [place] [address]  . Sodium 12/21/2017 141  135 - 145 mmol/L Final  . Potassium 12/21/2017 3.8  3.5 - 5.1 mmol/L Final  . Chloride 12/21/2017 104  101 - 111 mmol/L Final  . CO2 12/21/2017 28  22 - 32 mmol/L Final  . Glucose, Bld 12/21/2017 105* 65 - 99 mg/dL Final  . BUN 12/21/2017 13  6 - 20 mg/dL Final  . Creatinine, Ser 12/21/2017 0.58  0.44 - 1.00 mg/dL Final  . Calcium 12/21/2017 8.8* 8.9 - 10.3 mg/dL Final  . GFR calc non Af Amer 12/21/2017 >60  >60 mL/min Final  . GFR calc Af Amer 12/21/2017 >60  >60 mL/min Final   Comment: (NOTE) The eGFR has been calculated using the CKD EPI equation. This calculation has not been validated in all clinical situations. eGFR's persistently <60 mL/min signify possible Chronic Kidney Disease.   Georgiann Hahn gap 12/21/2017 9  5 - 15 Final   Performed at [facility], [place] [address], [place], [place] [address]  Hospital Outpatient Visit on 12/11/2017  Component Date Value Ref Range Status  . aPTT 12/11/2017 27  24 - 36 seconds Final   Performed at [facility], [place] [address] [place], [place] [address]  . WBC 12/11/2017 5.7  4.0 - 10.5 K/uL  Final  . RBC 12/11/2017 3.87  3.87 - 5.11 MIL/uL Final  . Hemoglobin 12/11/2017 12.1  12.0 - 15.0 g/dL Final  . HCT 12/11/2017 38.1  36.0 - 46.0 % Final  . MCV 12/11/2017 98.4  78.0 - 100.0 fL Final  . MCH 12/11/2017 31.3  26.0 - 34.0 pg Final  . MCHC 12/11/2017 31.8  30.0 - 36.0 g/dL Final  . RDW 12/11/2017 13.5  11.5 - 15.5 % Final  . Platelets 12/11/2017 253  150 - 400 K/uL Final   Performed at [facility], [place] [address] [place], [place] [address]  . Sodium 12/11/2017 143  135 - 145 mmol/L Final  . Potassium 12/11/2017 4.2  3.5 - 5.1 mmol/L Final  . Chloride 12/11/2017 109  101 - 111 mmol/L Final  . CO2 12/11/2017 29  22 - 32 mmol/L Final  . Glucose, Bld 12/11/2017 105* 65 - 99 mg/dL Final  . BUN 12/11/2017 8  6 - 20 mg/dL Final  . Creatinine, Ser 12/11/2017 0.66  0.44 - 1.00 mg/dL Final  . Calcium 12/11/2017 9.3  8.9 - 10.3 mg/dL Final  . Total Protein 12/11/2017 6.4* 6.5 - 8.1 g/dL Final  . Albumin 12/11/2017 3.4* 3.5 - 5.0 g/dL Final  . AST 12/11/2017 26  15 - 41 U/L Final  . ALT 12/11/2017 17  14 - 54 U/L Final  . Alkaline Phosphatase 12/11/2017 54  38 - 126 U/L Final  . Total Bilirubin 12/11/2017 0.3  0.3 - 1.2 mg/dL Final  . GFR calc non Af Amer 12/11/2017 >60  >60 mL/min Final  . GFR calc Af Amer 12/11/2017 >60  >60 mL/min Final   Comment: (NOTE) The eGFR has been calculated using the CKD EPI equation. This calculation has not been validated in all clinical situations. eGFR's persistently <60 mL/min signify possible Chronic Kidney Disease.   Georgiann Hahn gap 12/11/2017 5  5 - 15 Final   Performed at [facility], [place] [address] [place], [place] [address]  . Prothrombin Time 12/11/2017 11.7  11.4 - 15.2 seconds Final  . INR 12/11/2017 0.87   Final   Performed at [facility], [place] [address], [place], [place] [address]  . ABO/RH(D) 12/11/2017 A POS   Final  . Antibody Screen 12/11/2017 NEG   Final  . Sample  Expiration 12/11/2017 12/22/2017   Final  . Extend sample reason 12/11/2017    Final                   Value:NO TRANSFUSIONS OR PREGNANCY IN THE PAST 3 MONTHS Performed at [facility], [place] [address] [place], [place] [address]   . MRSA, PCR 12/11/2017 NEGATIVE  NEGATIVE Final  . Staphylococcus aureus 12/11/2017 POSITIVE* NEGATIVE Final   Comment: (NOTE) The Xpert SA Assay (FDA approved for NASAL specimens in patients 67 years of age and older), is one component of a comprehensive surveillance program. It is not intended to diagnose infection nor to guide or monitor treatment. Performed at [facility], [place] [address], [place], [place] [address]      X-Rays:Dg Pelvis Portable  Result Date: 12/19/2017 CLINICAL DATA:  Status post right hip replacement. EXAM: PORTABLE PELVIS 1-2 VIEWS COMPARISON:  Radiograph of September 24, 2017. FINDINGS: There is no evidence of pelvic fracture or diastasis. No pelvic bone lesions are seen. Interval placement of right total hip arthroplasty. Surgical drain is noted adjacent to right hip with other expected postoperative findings. Previously placed left hip arthroplasty is again noted. IMPRESSION: Status post right total hip arthroplasty. Electronically Signed   By: Marijo Conception, M.D.   On: 12/19/2017 15:03   Dg C-arm 1-60 Min-no Report  Result Date: 12/19/2017 Fluoroscopy was utilized by the requesting physician.  No radiographic interpretation.    EKG: Orders placed or performed during the hospital encounter of 07/04/17  . ED EKG  . ED EKG  . EKG 12-Lead  . EKG 12-Lead  . EKG     Hospital Course: Patient was admitted to [facility] and taken to the OR and underwent the above state procedure without complications.  Patient tolerated the procedure well and was later transferred to the recovery room and then to the orthopaedic floor for postoperative care.  They were given PO and IV analgesics for pain  control following their surgery.  They were given 24 hours of postoperative antibiotics of  Anti-infectives (From admission, onward)   Start     Dose/Rate Route Frequency Ordered Stop   12/19/17 1830  ceFAZolin (ANCEF) IVPB 1 g/50 mL premix     1 g 100 mL/hr over 30 Minutes Intravenous Every 6 hours 12/19/17 1539 12/20/17 0114   12/19/17 1145  ceFAZolin (ANCEF) IVPB 2g/100 mL premix     2 g 200 mL/hr over 30 Minutes Intravenous On call to O.R. 12/19/17 1142 12/19/17 1256     and started on DVT prophylaxis in the form of Aspirin.   PT and OT were ordered for total hip protocol.  The patient was allowed to be WBAT with therapy. Discharge planning was consulted to help with postop disposition and equipment needs.  Patient had a good night on the evening of surgery.  They started to get up OOB with therapy on day one.  Hemovac drain was pulled without difficulty.  Continued to work with therapy into day two.  Dressing was changed on day two and the incision was clean and dry.  The patient had progressed with therapy and meeting their goals.  Incision was healing well.  Patient was seen in rounds and was ready to go home.  Diet: Cardiac diet Activity:WBAT Follow-up:in 2 weeks Disposition - Home Discharged Condition: stable   Discharge Instructions    Call MD / Call 911   Complete by:  As directed    If you experience chest pain or shortness of breath, CALL 911 and be transported to the hospital emergency room.  If you develope a fever above 101 F, pus (white drainage) or increased drainage or redness at the wound, or calf pain, call your surgeon's office.   Constipation Prevention   Complete by:  As directed    Drink plenty of fluids.  Prune juice may be helpful.  You may use a stool softener, such as Colace (over the counter) 100 mg twice a day.  Use MiraLax (over the counter) for constipation as needed.   Diet - low sodium heart healthy   Complete by:  As directed    Discharge instructions    Complete by:  As directed    Dr. Gaynelle Arabian Total Joint Specialist [facility] [address] [place], [place] [address] [phone]  ANTERIOR APPROACH TOTAL HIP REPLACEMENT POSTOPERATIVE DIRECTIONS   Hip Rehabilitation, Guidelines Following Surgery  The results of a hip operation are greatly improved after range of motion and muscle strengthening exercises. Follow all safety measures which are given to protect your hip. If any of these exercises cause increased pain or swelling in your joint, decrease the amount until you are comfortable again. Then slowly increase the exercises. Call your caregiver if you have problems or questions.   HOME CARE INSTRUCTIONS  Remove items at home which could result in a fall. This includes throw rugs or furniture in walking pathways.  ICE to the affected hip every three hours for 30 minutes at a time and then as needed for pain and swelling.  Continue to use ice on the hip for pain and swelling from surgery. You may notice swelling that will  progress down to the foot and ankle.  This is normal after surgery.  Elevate the leg when you are not up walking on it.   Continue to use the breathing machine which will help keep your temperature down.  It is common for your temperature to cycle up and down following surgery, especially at night when you are not up moving around and exerting yourself.  The breathing machine keeps your lungs expanded and your temperature down.   DIET You may resume your previous home diet once your are discharged from the hospital.  DRESSING / WOUND CARE / SHOWERING You may change your dressing every day with sterile gauze.  Please use good hand washing techniques before changing the dressing.  Do not use any lotions or creams on the incision until instructed by your surgeon. You may start showering once you are discharged home but do not submerge the incision under water. Just pat the incision dry and apply a dry  gauze dressing on daily. Change the surgical dressing daily and reapply a dry dressing each time.  ACTIVITY Walk with your walker as instructed. Use walker as long as suggested by your caregivers. Avoid periods of inactivity such as sitting longer than an hour when not asleep. This helps prevent blood clots.  You may resume a sexual relationship in one month or when given the OK by your doctor.  You may return to work once you are cleared by your doctor.  Do not drive a car for 6 weeks or until released by you surgeon.  Do not drive while taking narcotics.  WEIGHT BEARING Weight bearing as tolerated with assist device (walker, cane, etc) as directed, use it as long as suggested by your surgeon or therapist, typically at least 4-6 weeks.  POSTOPERATIVE CONSTIPATION PROTOCOL Constipation - defined medically as fewer than three stools per week and severe constipation as less than one stool per week.  One of the most common issues patients have following surgery is constipation.  Even if you have a regular bowel pattern at home, your normal regimen is likely to be disrupted due to multiple reasons following surgery.  Combination of anesthesia, postoperative narcotics, change in appetite and fluid intake all can affect your bowels.  In order to avoid complications following surgery, here are some recommendations in order to help you during your recovery period.  Colace (docusate) - Pick up an over-the-counter form of Colace or another stool softener and take twice a day as long as you are requiring postoperative pain medications.  Take with a full glass of water daily.  If you experience loose stools or diarrhea, hold the colace until you stool forms back up.  If your symptoms do not get better within 1 week or if they get worse, check with your doctor.  Dulcolax (bisacodyl) - Pick up over-the-counter and take as directed by the product packaging as needed to assist with the movement of your bowels.   Take with a full glass of water.  Use this product as needed if not relieved by Colace only.   MiraLax (polyethylene glycol) - Pick up over-the-counter to have on hand.  MiraLax is a solution that will increase the amount of water in your bowels to assist with bowel movements.  Take as directed and can mix with a glass of water, juice, soda, coffee, or tea.  Take if you go more than two days without a movement. Do not use MiraLax more than once per day. Call your doctor if  you are still constipated or irregular after using this medication for 7 days in a row.  If you continue to have problems with postoperative constipation, please contact the office for further assistance and recommendations.  If you experience "the worst abdominal pain ever" or develop nausea or vomiting, please contact the office immediatly for further recommendations for treatment.  ITCHING  If you experience itching with your medications, try taking only a single pain pill, or even half a pain pill at a time.  You can also use Benadryl over the counter for itching or also to help with sleep.   TED HOSE STOCKINGS Wear the elastic stockings on both legs for three weeks following surgery during the day but you may remove then at night for sleeping.  MEDICATIONS See your medication summary on the "After Visit Summary" that the nursing staff will review with you prior to discharge.  You may have some home medications which will be placed on hold until you complete the course of blood thinner medication.  It is important for you to complete the blood thinner medication as prescribed by your surgeon.  Continue your approved medications as instructed at time of discharge.  PRECAUTIONS If you experience chest pain or shortness of breath - call 911 immediately for transfer to the hospital emergency department.  If you develop a fever greater that 101 F, purulent drainage from wound, increased redness or drainage from wound, foul odor  from the wound/dressing, or calf pain - CONTACT YOUR SURGEON.                                                   FOLLOW-UP APPOINTMENTS Make sure you keep all of your appointments after your operation with your surgeon and caregivers. You should call the office at the above phone number and make an appointment for approximately two weeks after the date of your surgery or on the date instructed by your surgeon outlined in the "After Visit Summary".  RANGE OF MOTION AND STRENGTHENING EXERCISES  These exercises are designed to help you keep full movement of your hip joint. Follow your caregiver's or physical therapist's instructions. Perform all exercises about fifteen times, three times per day or as directed. Exercise both hips, even if you have had only one joint replacement. These exercises can be done on a training (exercise) mat, on the floor, on a table or on a bed. Use whatever works the best and is most comfortable for you. Use music or television while you are exercising so that the exercises are a pleasant break in your day. This will make your life better with the exercises acting as a break in routine you can look forward to.  Lying on your back, slowly slide your foot toward your buttocks, raising your knee up off the floor. Then slowly slide your foot back down until your leg is straight again.  Lying on your back spread your legs as far apart as you can without causing discomfort.  Lying on your side, raise your upper leg and foot straight up from the floor as far as is comfortable. Slowly lower the leg and repeat.  Lying on your back, tighten up the muscle in the front of your thigh (quadriceps muscles). You can do this by keeping your leg straight and trying to raise your heel off the floor. This  helps strengthen the largest muscle supporting your knee.  Lying on your back, tighten up the muscles of your buttocks both with the legs straight and with the knee bent at a comfortable angle while  keeping your heel on the floor.   IF YOU ARE TRANSFERRED TO A SKILLED REHAB FACILITY If the patient is transferred to a skilled rehab facility following release from the hospital, a list of the current medications will be sent to the facility for the patient to continue.  When discharged from the skilled rehab facility, please have the facility set up the patient's [place] prior to being released. Also, the skilled facility will be responsible for providing the patient with their medications at time of release from the facility to include their pain medication, the muscle relaxants, and their blood thinner medication. If the patient is still at the rehab facility at time of the two week follow up appointment, the skilled rehab facility will also need to assist the patient in arranging follow up appointment in our office and any transportation needs.  MAKE SURE YOU:  Understand these instructions.  Get help right away if you are not doing well or get worse.    Pick up stool softner and laxative for home use following surgery while on pain medications. Do not submerge incision under water. Please use good hand washing techniques while changing dressing each day. May shower starting three days after surgery. Please use a clean towel to pat the incision dry following showers. Continue to use ice for pain and swelling after surgery. Do not use any lotions or creams on the incision until instructed by your surgeon.   Increase activity slowly as tolerated   Complete by:  As directed      Allergies as of 12/21/2017      Reactions   Gramineae Pollens    Statins Other (See Comments)   Causing liver damage       Medication List    STOP taking these medications   HYDROcodone-acetaminophen 10-325 MG tablet Commonly known as:  NORCO     TAKE these medications   aspirin 325 MG EC tablet Take 1 tablet (325 mg total) by mouth 2 (two) times daily.   buPROPion 150 MG 24 hr  tablet Commonly known as:  WELLBUTRIN XL Take 1 tablet (150 mg total) by mouth every morning.   lamoTRIgine 200 MG tablet Commonly known as:  LAMICTAL Take 1 tablet (200 mg total) by mouth daily.   methocarbamol 500 MG tablet Commonly known as:  ROBAXIN Take 1 tablet (500 mg total) by mouth every 6 (six) hours as needed for muscle spasms.   oxyCODONE 5 MG immediate release tablet Commonly known as:  Oxy IR/ROXICODONE Take 1-2 tablets (5-10 mg total) by mouth every 4 (four) hours as needed for moderate pain or severe pain.   timolol 0.5 % ophthalmic solution Commonly known as:  TIMOPTIC Instill 1 drop in each eye once daily   TRAVATAN Z 0.004 % Soln ophthalmic solution Generic drug:  Travoprost (BAK Free) Place 1 drop into both eyes at bedtime.   venlafaxine XR 150 MG 24 hr capsule Commonly known as:  EFFEXOR-XR TAKE 1 CAPSULE(150 MG) BY MOUTH DAILY WITH BREAKFAST   zolpidem 5 MG tablet Commonly known as:  AMBIEN Take 1 tablet (5 mg total) by mouth at bedtime as needed for sleep.      Follow-up Information    Gaynelle Arabian, MD. Schedule an appointment as soon as possible  for a visit on 01/01/2018.   Specialty:  Orthopedic Surgery Contact information: [address] [place] [address] [phone]        [facility] Follow up.   Specialty:  Shelly Why:  physical therapy Contact information: [address] [place]  [address] [phone]           Signed: Ardeen Jourdain, PA-C Orthopaedic Surgery 12/21/2017, 2:35 PM

## 2017-12-21 NOTE — Progress Notes (Signed)
Physical Therapy Treatment Patient Details Name: [name] [name] MRN: [id number] DOB: [birth year] Today's Date: 12/21/2017    History of Present Illness 62 yo female s/p R DA THA , S/O  L THA-DA 09/24/17. Hx of bil TKA 2008, hearing impaired (bil hearing aids), OSA, obesity.     PT Comments    The patient is progressing well. Will practice steps next visit.   Follow Up Recommendations  Home health PT     Equipment Recommendations  None recommended by PT    Recommendations for Other Services       Precautions / Restrictions Precautions Precautions: Fall    Mobility  Bed Mobility Overal bed mobility: Needs Assistance Bed Mobility: Supine to Sit;Sit to Supine           General bed mobility comments: PATIENT USED BELT AROUND BOTH FEET TO SELF ASSIST LEGS ONTO AND OFF BED.  Transfers Overall transfer level: Needs assistance Equipment used: Rolling walker (2 wheeled) Transfers: Sit to/from Stand Sit to Stand: Supervision            Ambulation/Gait Ambulation/Gait assistance: Supervision Ambulation Distance (Feet): 90 Feet Assistive device: Rolling walker (2 wheeled) Gait Pattern/deviations: Step-to pattern;Step-through pattern     General Gait Details: cues for sequence and posture   Stairs             Wheelchair Mobility    Modified Rankin (Stroke Patients Only)       Balance                                            Cognition Arousal/Alertness: Awake/alert                                            Exercises Total Joint Exercises Ankle Circles/Pumps: AROM;Both;10 reps Quad Sets: AROM;Both;10 reps Short Arc Quad: AROM;Right;10 reps Heel Slides: AAROM;Right;10 reps Hip ABduction/ADduction: AAROM;Right;10 reps    General Comments        Pertinent Vitals/Pain Pain Score: 6  Pain Location: right  thigh Pain Descriptors / Indicators: Burning;Cramping;Sharp Pain Intervention(s): Monitored during  session;Premedicated before session;Ice applied    Home Living                      Prior Function            PT Goals (current goals can now be found in the care plan section) Progress towards PT goals: Progressing toward goals    Frequency    7X/week      PT Plan Current plan remains appropriate    Co-evaluation              AM-PAC PT "6 Clicks" Daily Activity  Outcome Measure  Difficulty turning over in bed (including adjusting bedclothes, sheets and blankets)?: A Little Difficulty moving from lying on back to sitting on the side of the bed? : A Little Difficulty sitting down on and standing up from a chair with arms (e.g., wheelchair, bedside commode, etc,.)?: A Little Help needed moving to and from a bed to chair (including a wheelchair)?: A Little Help needed walking in hospital room?: A Little Help needed climbing 3-5 steps with a railing? : A Lot 6 Click Score: 17    End of Session  Activity Tolerance: Patient tolerated treatment well Patient left: in bed;with call bell/phone within reach Nurse Communication: Mobility status PT Visit Diagnosis: Difficulty in walking, not elsewhere classified (R26.2);Pain     Time: 2130-8657 PT Time Calculation (min) (ACUTE ONLY): 34 min  Charges:  $Gait Training: 8-22 mins $Therapeutic Exercise: 8-22 mins                    G CodesBlanchard Kelch PT [phone]    [name] 12/21/2017, 1:20 PM

## 2017-12-21 NOTE — Progress Notes (Signed)
Provided discharge instructions to patient.  Patient verbalized understanding.  No questions/concerns at this time.  Patient discharging to home with all belongings.

## 2017-12-26 ENCOUNTER — Encounter: Payer: Self-pay | Admitting: Gastroenterology

## 2018-01-21 ENCOUNTER — Encounter (HOSPITAL_COMMUNITY): Payer: Self-pay | Admitting: Psychiatry

## 2018-01-21 ENCOUNTER — Ambulatory Visit (INDEPENDENT_AMBULATORY_CARE_PROVIDER_SITE_OTHER): Payer: PRIVATE HEALTH INSURANCE | Admitting: Psychiatry

## 2018-01-21 VITALS — BP 142/80 | HR 90 | Ht 60.0 in | Wt 175.0 lb

## 2018-01-21 DIAGNOSIS — Z811 Family history of alcohol abuse and dependence: Secondary | ICD-10-CM

## 2018-01-21 DIAGNOSIS — Z87891 Personal history of nicotine dependence: Secondary | ICD-10-CM | POA: Diagnosis not present

## 2018-01-21 DIAGNOSIS — Z818 Family history of other mental and behavioral disorders: Secondary | ICD-10-CM

## 2018-01-21 DIAGNOSIS — Z599 Problem related to housing and economic circumstances, unspecified: Secondary | ICD-10-CM

## 2018-01-21 DIAGNOSIS — F330 Major depressive disorder, recurrent, mild: Secondary | ICD-10-CM | POA: Diagnosis not present

## 2018-01-21 DIAGNOSIS — F411 Generalized anxiety disorder: Secondary | ICD-10-CM | POA: Diagnosis not present

## 2018-01-21 DIAGNOSIS — Z79899 Other long term (current) drug therapy: Secondary | ICD-10-CM

## 2018-01-21 MED ORDER — VENLAFAXINE HCL ER 150 MG PO CP24: ORAL_CAPSULE | ORAL | 0 refills | Status: DC

## 2018-01-21 MED ORDER — ZOLPIDEM TARTRATE 5 MG PO TABS: 5.0000 mg | ORAL_TABLET | Freq: Every evening | ORAL | 1 refills | Status: DC | PRN

## 2018-01-21 MED ORDER — BUPROPION HCL ER (XL) 150 MG PO TB24: 150.0000 mg | ORAL_TABLET | Freq: Every morning | ORAL | 0 refills | Status: DC

## 2018-01-21 MED ORDER — HYDROXYZINE PAMOATE 25 MG PO CAPS: 25.0000 mg | ORAL_CAPSULE | Freq: Every evening | ORAL | 0 refills | Status: DC | PRN

## 2018-01-21 MED ORDER — LAMOTRIGINE 200 MG PO TABS: 200.0000 mg | ORAL_TABLET | Freq: Every day | ORAL | 0 refills | Status: DC

## 2018-01-21 NOTE — Progress Notes (Signed)
BH MD/PA/NP OP Progress Note  01/21/2018 10:29 AM [name] [name]  MRN:  [id number]  Chief Complaint: Feeling sad and depressed.  My financial situation is getting worse.  HPI: Patient came for her follow-up appointment.  She was late and she refused to leave until seen by physician.  Patient told she is under a lot of stress because of finances.  She was hoping to get lump some from disability but she did not approved but hoping to receive monthly disability payment.  She is worried about her mother who has dementia and lately her memory is so poor that she did not recognize her.  She is borrowing money from her father.  She also endorsed relationship with her partner getting worse.  She is not sure why [name] is no more supportive.  Patient told she has no other place to go but she is spending more time with her parents who really needs her.  Patient recently seen her pain specialist.  She has 5 weeks postop from her right hip surgery.  She also have surgery on her left hip in February.  She is taking pain medication because without pain medication she has difficulty walking.  She admitted there are nights that she is not sleeping as good but ambiens put her on sleep.  She endorsed a lot of anxiety and nervousness and feeling overwhelmed about her future.  She has a lot of health issues.  She denies any suicidal thoughts or homicidal thought but endorsed having crying spells and feeling of hopelessness.  She denies any paranoia, hallucination or any self abusive behavior.  Her appetite is okay.  Her energy level is fair.  She admitted not able to see Izora Gala for a while but scheduled to see her tomorrow morning and she like to keep that appointment.  Visit Diagnosis:    ICD-10-CM   1. Major depressive disorder, recurrent episode, mild (HCC) F33.0 buPROPion (WELLBUTRIN XL) 150 MG 24 hr tablet    venlafaxine XR (EFFEXOR-XR) 150 MG 24 hr capsule    zolpidem (AMBIEN) 5 MG tablet    lamoTRIgine (LAMICTAL) 200  MG tablet  2. GAD (generalized anxiety disorder) F41.1 hydrOXYzine (VISTARIL) 25 MG capsule    Past Psychiatric History: Reviewed Patient has hospitalization in 2010 when her brother committed suicide. She was feeling very depressed and admitted at behavioral [facility]. She has been taking antidepressants since 2008. She had tried Prozac and Paxil. She remember Prozac make her more manic and Paxil did not help. She also tried Abilify which caused headaches and diarrhea. Patient denies any history of suicidal attempt, hallucination, psychosis or mania.  Past Medical History:  Past Medical History:  Diagnosis Date  . Anxiety   . Deafness    Wears Bilat hearing aids since Childhood  . Environmental and seasonal allergies   . Glaucoma, both eyes   . Hyperlipidemia   . Major depressive disorder   . Mild cognitive impairment, so stated 07/16/2013   Patient subjectively complains of word finding and memory deficits. MOCA 30-30 on 07-16-13 , paraphrasic errors- uses words such as  closet for garage   . OA (osteoarthritis)    "all over"  . Obstructive sleep apnea treated with BiPAP   . Right arm weakness    upper due to shoulder issues  . Wears glasses   . Wears hearing aid in both ears     Past Surgical History:  Procedure Laterality Date  . KNEE ARTHROSCOPY W/ SYNOVECTOMY Left 07-20-2010  dr  aluisio  [facility]  . LAPAROSCOPIC VAGINAL HYSTERECTOMY WITH SALPINGO OOPHORECTOMY Bilateral 09-25-2005   dr Julien Girt  [facility]  . RIGHT ANKLE TENDON REPAIR  12-21-2008   dr Shellia Carwin  [facility]  . TOTAL HIP ARTHROPLASTY Left 09/24/2017   Procedure: LEFT TOTAL HIP ARTHROPLASTY ANTERIOR APPROACH;  Surgeon: Gaynelle Arabian, MD;  Location: [facility];  Service: Orthopedics;  Laterality: Left;  . TOTAL HIP ARTHROPLASTY Right 12/19/2017   Procedure: RIGHT TOTAL HIP ARTHROPLASTY ANTERIOR APPROACH;  Surgeon: Gaynelle Arabian, MD;  Location: [facility];  Service: Orthopedics;  Laterality: Right;  . TOTAL KNEE ARTHROPLASTY Left  11/21/2013   Procedure: LEFT TOTAL KNEE  ARTHROPLASTY REVISION;  Surgeon: Gearlean Alf, MD;  Location: [facility];  Service: Orthopedics;  Laterality: Left;  . TOTAL KNEE ARTHROPLASTY Bilateral 06-11-2006   dr Wynelle Link  [facility]    Family Psychiatric History: Reviewed.  Family History:  Family History  Problem Relation Age of Onset  . Depression Father   . Depression Paternal Grandmother   . Depression Mother   . Alcohol abuse Mother   . Depression Brother   . Alcohol abuse Brother     Social History:  Social History   Socioeconomic History  . Marital status: Married    Spouse name: Not on file  . Number of children: 0  . Years of education: Not on file  . Highest education level: Not on file  Occupational History    Employer: [employer]  . Financial resource strain: Not on file  . Food insecurity:    Worry: Not on file    Inability: Not on file  . Transportation needs:    Medical: Not on file    Non-medical: Not on file  Tobacco Use  . Smoking status: Former Smoker    Years: 12.00    Types: Cigarettes    Last attempt to quit: 11/18/1988    Years since quitting: 29.1  . Smokeless tobacco: Never Used  Substance and Sexual Activity  . Alcohol use: Yes    Comment: occ  . Drug use: No  . Sexual activity: Yes    Partners: Female    Birth control/protection: None  Lifestyle  . Physical activity:    Days per week: Not on file    Minutes per session: Not on file  . Stress: Not on file  Relationships  . Social connections:    Talks on phone: Not on file    Gets together: Not on file    Attends religious service: Not on file    Active member of club or organization: Not on file    Attends meetings of clubs or organizations: Not on file    Relationship status: Not on file  Other Topics Concern  . Not on file  Social History Narrative   Works full-time @ [employer]    Has a cat,3 dogs    lives with Partner(Female)[name]    Allergies:  Allergies   Allergen Reactions  . Gramineae Pollens   . Statins Other (See Comments)    Causing liver damage     Metabolic Disorder Labs: Recent Results (from the past 2160 hour(s))  APTT     Status: None   Collection Time: 12/11/17  2:27 PM  Result Value Ref Range   aPTT 27 24 - 36 seconds    Comment: Performed at [facility], [place] [address] [place], [place] [address]  CBC     Status: None   Collection Time: 12/11/17  2:27 PM  Result Value Ref Range   WBC 5.7 4.0 - 10.5 K/uL   RBC 3.87 3.87 - 5.11 MIL/uL   Hemoglobin 12.1 12.0 - 15.0 g/dL   HCT 38.1 36.0 - 46.0 %   MCV 98.4 78.0 - 100.0 fL   MCH 31.3 26.0 - 34.0 pg   MCHC 31.8 30.0 - 36.0 g/dL   RDW 13.5 11.5 - 15.5 %   Platelets 253 150 - 400 K/uL    Comment: Performed at [facility], [place] [address], [place], [place] [address]  Comprehensive metabolic panel     Status: Abnormal   Collection Time: 12/11/17  2:27 PM  Result Value Ref Range   Sodium 143 135 - 145 mmol/L   Potassium 4.2 3.5 - 5.1 mmol/L   Chloride 109 101 - 111 mmol/L   CO2 29 22 - 32 mmol/L   Glucose, Bld 105 (H) 65 - 99 mg/dL   BUN 8 6 - 20 mg/dL   Creatinine, Ser 0.66 0.44 - 1.00 mg/dL   Calcium 9.3 8.9 - 10.3 mg/dL   Total Protein 6.4 (L) 6.5 - 8.1 g/dL   Albumin 3.4 (L) 3.5 - 5.0 g/dL   AST 26 15 - 41 U/L   ALT 17 14 - 54 U/L   Alkaline Phosphatase 54 38 - 126 U/L   Total Bilirubin 0.3 0.3 - 1.2 mg/dL   GFR calc non Af Amer >60 >60 mL/min   GFR calc Af Amer >60 >60 mL/min    Comment: (NOTE) The eGFR has been calculated using the CKD EPI equation. This calculation has not been validated in all clinical situations. eGFR's persistently <60 mL/min signify possible Chronic Kidney Disease.    Anion gap 5 5 - 15    Comment: Performed at [facility], [place] [address], [place], [place] [address]  Protime-INR     Status: None   Collection Time: 12/11/17  2:27 PM  Result Value Ref Range   Prothrombin  Time 11.7 11.4 - 15.2 seconds   INR 0.87     Comment: Performed at [facility], [place] [address] [place], [place] [address]  Type and screen Order type and screen if day of surgery is less than 15 days from draw of preadmission visit or order morning of surgery if day of surgery is greater than 6 days from preadmission visit.     Status: None   Collection Time: 12/11/17  2:27 PM  Result Value Ref Range   ABO/RH(D) A POS    Antibody Screen NEG    Sample Expiration 12/22/2017    Extend sample reason      NO TRANSFUSIONS OR PREGNANCY IN THE PAST 3 MONTHS Performed at [facility], [place] [address], [place], [place] [address]   Surgical pcr screen     Status: Abnormal   Collection Time: 12/11/17  2:27 PM  Result Value Ref Range   MRSA, PCR NEGATIVE NEGATIVE   Staphylococcus aureus POSITIVE (A) NEGATIVE    Comment: (NOTE) The Xpert SA Assay (FDA approved for NASAL specimens in patients 59 years of age and older), is one component of a comprehensive surveillance program. It is not intended to diagnose infection nor to guide or monitor treatment. Performed at [facility], [place] [address], [place],  [address]   CBC     Status: Abnormal   Collection Time: 12/20/17  5:42 AM  Result Value Ref Range   WBC 11.0 (H) 4.0 -  10.5 K/uL   RBC 3.22 (L) 3.87 - 5.11 MIL/uL   Hemoglobin 10.1 (L) 12.0 - 15.0 g/dL   HCT 31.4 (L) 36.0 - 46.0 %   MCV 97.5 78.0 - 100.0 fL   MCH 31.4 26.0 - 34.0 pg   MCHC 32.2 30.0 - 36.0 g/dL   RDW 13.2 11.5 - 15.5 %   Platelets 275 150 - 400 K/uL    Comment: Performed at [facility], [place] [address] [place], [place] [address]  Basic metabolic panel     Status: Abnormal   Collection Time: 12/20/17  5:42 AM  Result Value Ref Range   Sodium 140 135 - 145 mmol/L   Potassium 4.5 3.5 - 5.1 mmol/L   Chloride 104 101 - 111 mmol/L   CO2 28 22 - 32 mmol/L   Glucose, Bld 128 (H) 65 - 99 mg/dL    BUN 9 6 - 20 mg/dL   Creatinine, Ser 0.54 0.44 - 1.00 mg/dL   Calcium 9.0 8.9 - 10.3 mg/dL   GFR calc non Af Amer >60 >60 mL/min   GFR calc Af Amer >60 >60 mL/min    Comment: (NOTE) The eGFR has been calculated using the CKD EPI equation. This calculation has not been validated in all clinical situations. eGFR's persistently <60 mL/min signify possible Chronic Kidney Disease.    Anion gap 8 5 - 15    Comment: Performed at [facility], [place] [address] [place], [place] [address]  CBC     Status: Abnormal   Collection Time: 12/21/17  5:21 AM  Result Value Ref Range   WBC 12.5 (H) 4.0 - 10.5 K/uL   RBC 2.88 (L) 3.87 - 5.11 MIL/uL   Hemoglobin 9.0 (L) 12.0 - 15.0 g/dL   HCT 28.1 (L) 36.0 - 46.0 %   MCV 97.6 78.0 - 100.0 fL   MCH 31.3 26.0 - 34.0 pg   MCHC 32.0 30.0 - 36.0 g/dL   RDW 13.6 11.5 - 15.5 %   Platelets 227 150 - 400 K/uL    Comment: Performed at [facility], [place] [address] [place], [place] [address]  Basic metabolic panel     Status: Abnormal   Collection Time: 12/21/17  5:21 AM  Result Value Ref Range   Sodium 141 135 - 145 mmol/L   Potassium 3.8 3.5 - 5.1 mmol/L   Chloride 104 101 - 111 mmol/L   CO2 28 22 - 32 mmol/L   Glucose, Bld 105 (H) 65 - 99 mg/dL   BUN 13 6 - 20 mg/dL   Creatinine, Ser 0.58 0.44 - 1.00 mg/dL   Calcium 8.8 (L) 8.9 - 10.3 mg/dL   GFR calc non Af Amer >60 >60 mL/min   GFR calc Af Amer >60 >60 mL/min    Comment: (NOTE) The eGFR has been calculated using the CKD EPI equation. This calculation has not been validated in all clinical situations. eGFR's persistently <60 mL/min signify possible Chronic Kidney Disease.    Anion gap 9 5 - 15    Comment: Performed at [facility], [place] [address] [place], [place] [address]   Lab Results  Component Value Date   HGBA1C 5.8 (H) 07/16/2013   No results found for: PROLACTIN No results found for: CHOL, TRIG, HDL, CHOLHDL, VLDL,  LDLCALC Lab Results  Component Value Date   TSH 4.160 07/16/2013    Therapeutic Level Labs: No results found for: LITHIUM No results found for: VALPROATE No  components found for:  CBMZ  Current Medications: Current Outpatient Medications  Medication Sig Dispense Refill  . aspirin EC 325 MG EC tablet Take 1 tablet (325 mg total) by mouth 2 (two) times daily. 30 tablet 0  . buPROPion (WELLBUTRIN XL) 150 MG 24 hr tablet Take 1 tablet (150 mg total) by mouth every morning. 90 tablet 0  . lamoTRIgine (LAMICTAL) 200 MG tablet Take 1 tablet (200 mg total) by mouth daily. 90 tablet 0  . methocarbamol (ROBAXIN) 500 MG tablet Take 1 tablet (500 mg total) by mouth every 6 (six) hours as needed for muscle spasms. 40 tablet 0  . oxyCODONE (OXY IR/ROXICODONE) 5 MG immediate release tablet Take 1-2 tablets (5-10 mg total) by mouth every 4 (four) hours as needed for moderate pain or severe pain. 56 tablet 0  . timolol (TIMOPTIC) 0.5 % ophthalmic solution Instill 1 drop in each eye once daily  3  . TRAVATAN Z 0.004 % SOLN ophthalmic solution Place 1 drop into both eyes at bedtime.     Marland Kitchen venlafaxine XR (EFFEXOR-XR) 150 MG 24 hr capsule TAKE 1 CAPSULE(150 MG) BY MOUTH DAILY WITH BREAKFAST 90 capsule 0  . zolpidem (AMBIEN) 5 MG tablet Take 1 tablet (5 mg total) by mouth at bedtime as needed for sleep. 15 tablet 1   No current facility-administered medications for this visit.      Musculoskeletal: Strength & Muscle Tone: decreased Gait & Station: Difficulty walking because of pain. Patient leans: Right  Psychiatric Specialty Exam: Review of Systems  Constitutional: Negative.   Musculoskeletal: Positive for joint pain.  Skin: Negative for itching and rash.  Psychiatric/Behavioral: Positive for depression. The patient is nervous/anxious.     Blood pressure (!) 142/80, pulse 90, height 5' (1.524 m), weight 175 lb (79.4 kg).There is no height or weight on file to calculate BMI.  General Appearance:  Casual and Tearful and emotional  Eye Contact:  Good  Speech:  Slow  Volume:  Decreased  Mood:  Dysphoric  Affect:  Congruent  Thought Process:  Goal Directed  Orientation:  Full (Time, Place, and Person)  Thought Content: Rumination   Suicidal Thoughts:  No  Homicidal Thoughts:  No  Memory:  Immediate;   Good Recent;   Good Remote;   Good  Judgement:  Good  Insight:  Fair  Psychomotor Activity:  Decreased  Concentration:  Concentration: Fair and Attention Span: Fair  Recall:  Good  Fund of Knowledge: NA  Language: Good  Akathisia:  No  Handed:  Right  AIMS (if indicated): not done  Assets:  Communication Skills Desire for Improvement Housing Resilience  ADL's:  Intact  Cognition: WNL  Sleep:  Fair   Screenings:   Assessment and Plan: Major depressive disorder, recurrent.  Generalized anxiety disorder.  Patient is experiencing increased crying spells and anxiety.  She worried about her situation and financial burden.  She is hoping to resume her disability check very soon.  I recommended to resume counseling with Izora Gala.  Patient has no rash, itching or tremors.  I reviewed her blood work results.  Her creatinine and BUN is normal.  Her WBC count is 12.5.  Continue Wellbutrin XL 150 mg daily, Effexor XR 150 mg daily, Lamictal 200 mg daily, Ambien 5 mg as needed for insomnia and I will also add low-dose Vistaril to help with anxiety.  Discussed safety concerns at any time having active suicidal thoughts or homicidal thought and she need to call 911 or go to local  emergency room.  I will see her again in 3 months.   Kathlee Nations, MD 01/21/2018, 10:29 AM

## 2018-03-25 ENCOUNTER — Ambulatory Visit (HOSPITAL_COMMUNITY): Payer: Self-pay | Admitting: Psychiatry

## 2018-04-02 ENCOUNTER — Other Ambulatory Visit (HOSPITAL_COMMUNITY): Payer: Self-pay

## 2018-04-02 DIAGNOSIS — F330 Major depressive disorder, recurrent, mild: Secondary | ICD-10-CM

## 2018-04-02 MED ORDER — ZOLPIDEM TARTRATE 5 MG PO TABS: 5.0000 mg | ORAL_TABLET | Freq: Every evening | ORAL | 1 refills | Status: DC | PRN

## 2018-06-03 ENCOUNTER — Ambulatory Visit (HOSPITAL_COMMUNITY): Payer: Self-pay | Admitting: Psychiatry

## 2018-06-10 ENCOUNTER — Ambulatory Visit (HOSPITAL_COMMUNITY): Payer: PRIVATE HEALTH INSURANCE | Admitting: Psychiatry

## 2018-06-17 ENCOUNTER — Other Ambulatory Visit (HOSPITAL_COMMUNITY): Payer: Self-pay | Admitting: Psychiatry

## 2018-06-17 DIAGNOSIS — F330 Major depressive disorder, recurrent, mild: Secondary | ICD-10-CM

## 2018-07-01 ENCOUNTER — Other Ambulatory Visit (HOSPITAL_COMMUNITY): Payer: Self-pay | Admitting: Psychiatry

## 2018-07-01 DIAGNOSIS — F330 Major depressive disorder, recurrent, mild: Secondary | ICD-10-CM

## 2018-07-08 ENCOUNTER — Other Ambulatory Visit (HOSPITAL_COMMUNITY): Payer: Self-pay | Admitting: Psychiatry

## 2018-07-08 DIAGNOSIS — F330 Major depressive disorder, recurrent, mild: Secondary | ICD-10-CM

## 2018-07-08 MED ORDER — BUPROPION HCL ER (XL) 150 MG PO TB24: 150.0000 mg | ORAL_TABLET | Freq: Every morning | ORAL | 0 refills | Status: DC

## 2018-07-08 MED ORDER — LAMOTRIGINE 200 MG PO TABS: 200.0000 mg | ORAL_TABLET | Freq: Every day | ORAL | 0 refills | Status: DC

## 2018-07-08 MED ORDER — VENLAFAXINE HCL ER 150 MG PO CP24: ORAL_CAPSULE | ORAL | 0 refills | Status: DC

## 2018-08-02 ENCOUNTER — Other Ambulatory Visit (HOSPITAL_COMMUNITY): Payer: Self-pay | Admitting: Psychiatry

## 2018-08-02 DIAGNOSIS — F330 Major depressive disorder, recurrent, mild: Secondary | ICD-10-CM

## 2018-08-05 ENCOUNTER — Encounter (HOSPITAL_COMMUNITY): Payer: Self-pay | Admitting: Psychiatry

## 2018-08-05 ENCOUNTER — Ambulatory Visit (INDEPENDENT_AMBULATORY_CARE_PROVIDER_SITE_OTHER): Payer: PRIVATE HEALTH INSURANCE | Admitting: Psychiatry

## 2018-08-05 DIAGNOSIS — F330 Major depressive disorder, recurrent, mild: Secondary | ICD-10-CM

## 2018-08-05 DIAGNOSIS — F411 Generalized anxiety disorder: Secondary | ICD-10-CM | POA: Diagnosis not present

## 2018-08-05 MED ORDER — BUPROPION HCL ER (XL) 150 MG PO TB24: 150.0000 mg | ORAL_TABLET | Freq: Every morning | ORAL | 0 refills | Status: DC

## 2018-08-05 MED ORDER — ZOLPIDEM TARTRATE 5 MG PO TABS: 5.0000 mg | ORAL_TABLET | Freq: Every evening | ORAL | 1 refills | Status: DC | PRN

## 2018-08-05 MED ORDER — VENLAFAXINE HCL ER 150 MG PO CP24: ORAL_CAPSULE | ORAL | 0 refills | Status: DC

## 2018-08-05 MED ORDER — LAMOTRIGINE 200 MG PO TABS: 200.0000 mg | ORAL_TABLET | Freq: Every day | ORAL | 0 refills | Status: DC

## 2018-08-05 MED ORDER — HYDROXYZINE PAMOATE 25 MG PO CAPS: 25.0000 mg | ORAL_CAPSULE | Freq: Every evening | ORAL | 0 refills | Status: DC | PRN

## 2018-08-05 NOTE — Progress Notes (Signed)
BH MD/PA/NP OP Progress Note  08/05/2018 4:10 PM [name] [name]  MRN:  [id number]  Chief Complaint: I am doing better.  I am getting Social Security disability.  HPI: [name] [name] came for her follow-up appointment.  She is feeling better since she is getting every month Social Security money.  She is thinking to move out from her partner however she has not told her yet.  She continues to struggle in the relationship.  Patient told some time her partner [name] [name] acts that she is sincere but then there are days when she totally ignore her.  She is planning to move out and looking places where she can live without any financial burden.  She feels current medicine is working.  She had recently carpal tunnel surgery on her right hand and she is improving.  She had a hip surgery earlier this year and she is walking much better.  Her sleep is good and she rarely takes Ambien but she need a new refill.  She also need a new refill on her hydroxyzine which she takes when she is very nervous and anxious.  Patient is compliant with Effexor, Lamictal and Wellbutrin.  She has no rash, itching tremors or shakes.  Her sleep is good.  She denies any mania, psychosis, hallucination or any suicidal thoughts.  Her energy level is good.  She feels good since her walking is improved from the past.  She denies any panic attack.  Visit Diagnosis:    ICD-10-CM   1. Major depressive disorder, recurrent episode, mild (HCC) F33.0 venlafaxine XR (EFFEXOR-XR) 150 MG 24 hr capsule    lamoTRIgine (LAMICTAL) 200 MG tablet    buPROPion (WELLBUTRIN XL) 150 MG 24 hr tablet    zolpidem (AMBIEN) 5 MG tablet  2. GAD (generalized anxiety disorder) F41.1 hydrOXYzine (VISTARIL) 25 MG capsule    Past Psychiatric History: Reviewed. History of inpatient hospitalization in 2010 when her brother committed suicide.  Took antidepressant since 2008.  Tried Prozac make her manic and Paxil did not help her.  Tried Abilify cause headaches and diarrhea.  No  history of suicidal attempt or hallucinations.    Past Medical History:  Past Medical History:  Diagnosis Date  . Anxiety   . Deafness    Wears Bilat hearing aids since Childhood  . Environmental and seasonal allergies   . Glaucoma, both eyes   . Hyperlipidemia   . Major depressive disorder   . Mild cognitive impairment, so stated 07/16/2013   Patient subjectively complains of word finding and memory deficits. MOCA 30-30 on 07-16-13 , paraphrasic errors- uses words such as  closet for garage   . OA (osteoarthritis)    "all over"  . Obstructive sleep apnea treated with BiPAP   . Right arm weakness    upper due to shoulder issues  . Wears glasses   . Wears hearing aid in both ears     Past Surgical History:  Procedure Laterality Date  . KNEE ARTHROSCOPY W/ SYNOVECTOMY Left 07-20-2010  [facility]  . LAPAROSCOPIC VAGINAL HYSTERECTOMY WITH SALPINGO OOPHORECTOMY Bilateral 09-25-2005   dr Renaldo [facility]iddleradkins  [facility]  . RIGHT ANKLE TENDON REPAIR  12-21-2008   [facility] Simonne Comeaplington  [facility]  . TOTAL HIP ARTHROPLASTY Left 09/24/2017   Procedure: LEFT TOTAL HIP ARTHROPLASTY ANTERIOR APPROACH;  Surgeon: Ollen GrossAluisio, Frank, MD;  Location: [facility];  Service: Orthopedics;  Laterality: Left;  . TOTAL HIP ARTHROPLASTY Right 12/19/2017   Procedure: RIGHT TOTAL HIP ARTHROPLASTY ANTERIOR APPROACH;  Surgeon: Lequita HaltAluisio,  Homero Fellers, MD;  Location: [facility];  Service: Orthopedics;  Laterality: Right;  . TOTAL KNEE ARTHROPLASTY Left 11/21/2013   Procedure: LEFT TOTAL KNEE  ARTHROPLASTY REVISION;  Surgeon: Loanne Drilling, MD;  Location: [facility];  Service: Orthopedics;  Laterality: Left;  . TOTAL KNEE ARTHROPLASTY Bilateral 06-11-2006   dr Lequita Halt  [facility]    Family Psychiatric History: Reviewed.  Family History:  Family History  Problem Relation Age of Onset  . Depression Father   . Depression Paternal Grandmother   . Depression Mother   . Alcohol abuse Mother   . Depression Brother   . Alcohol abuse Brother     Social History:   Social History   Socioeconomic History  . Marital status: Married    Spouse name: Not on file  . Number of children: 0  . Years of education: Not on file  . Highest education level: Not on file  Occupational History    Employer: [employer]  Social Needs  . Financial resource strain: Not on file  . Food insecurity:    Worry: Not on file    Inability: Not on file  . Transportation needs:    Medical: Not on file    Non-medical: Not on file  Tobacco Use  . Smoking status: Former Smoker    Years: 12.00    Types: Cigarettes    Last attempt to quit: 11/18/1988    Years since quitting: 29.7  . Smokeless tobacco: Never Used  Substance and Sexual Activity  . Alcohol use: Yes    Comment: occ  . Drug use: No  . Sexual activity: Yes    Partners: Female    Birth control/protection: None  Lifestyle  . Physical activity:    Days per week: Not on file    Minutes per session: Not on file  . Stress: Not on file  Relationships  . Social connections:    Talks on phone: Not on file    Gets together: Not on file    Attends religious service: Not on file    Active member of club or organization: Not on file    Attends meetings of clubs or organizations: Not on file    Relationship status: Not on file  Other Topics Concern  . Not on file  Social History Narrative   Works full-time @ [employer]    Has a cat,3 dogs    lives with Partner(Female)[name]    Allergies:  Allergies  Allergen Reactions  . Gramineae Pollens   . Statins Other (See Comments)    Causing liver damage     Metabolic Disorder Labs: Lab Results  Component Value Date   HGBA1C 5.8 (H) 07/16/2013   No results found for: PROLACTIN No results found for: CHOL, TRIG, HDL, CHOLHDL, VLDL, LDLCALC Lab Results  Component Value Date   TSH 4.160 07/16/2013    Therapeutic Level Labs: No results found for: LITHIUM No results found for: VALPROATE No components found for:  CBMZ  Current Medications: Current  Outpatient Medications  Medication Sig Dispense Refill  . aspirin EC 325 MG EC tablet Take 1 tablet (325 mg total) by mouth 2 (two) times daily. 30 tablet 0  . buPROPion (WELLBUTRIN XL) 150 MG 24 hr tablet Take 1 tablet (150 mg total) by mouth every morning. 90 tablet 0  . hydrOXYzine (VISTARIL) 25 MG capsule Take 1 capsule (25 mg total) by mouth at bedtime as needed for anxiety. 30 capsule 0  . lamoTRIgine (LAMICTAL) 200 MG tablet  Take 1 tablet (200 mg total) by mouth daily. 90 tablet 0  . methocarbamol (ROBAXIN) 500 MG tablet Take 1 tablet (500 mg total) by mouth every 6 (six) hours as needed for muscle spasms. 40 tablet 0  . oxyCODONE (OXY IR/ROXICODONE) 5 MG immediate release tablet Take 1-2 tablets (5-10 mg total) by mouth every 4 (four) hours as needed for moderate pain or severe pain. 56 tablet 0  . timolol (TIMOPTIC) 0.5 % ophthalmic solution Instill 1 drop in each eye once daily  3  . TRAVATAN Z 0.004 % SOLN ophthalmic solution Place 1 drop into both eyes at bedtime.     Marland Kitchen. venlafaxine XR (EFFEXOR-XR) 150 MG 24 hr capsule TAKE 1 CAPSULE(150 MG) BY MOUTH DAILY WITH BREAKFAST 90 capsule 0  . zolpidem (AMBIEN) 5 MG tablet Take 1 tablet (5 mg total) by mouth at bedtime as needed for sleep. 15 tablet 1   No current facility-administered medications for this visit.      Musculoskeletal: Strength & Muscle Tone: within normal limits Gait & Station: normal Patient leans: N/A  Psychiatric Specialty Exam: Review of Systems  Musculoskeletal:       Pain in right hand    Blood pressure 118/80, pulse 82, height 5' (1.524 m), weight 184 lb 6.4 oz (83.6 kg), SpO2 95 %.There is no height or weight on file to calculate BMI.  General Appearance: Casual  Eye Contact:  Good  Speech:  Clear and Coherent  Volume:  Normal  Mood:  Anxious  Affect:  Congruent  Thought Process:  Goal Directed  Orientation:  Full (Time, Place, and Person)  Thought Content: Logical   Suicidal Thoughts:  No  Homicidal  Thoughts:  No  Memory:  Immediate;   Good Recent;   Fair Remote;   Fair  Judgement:  Good  Insight:  Good  Psychomotor Activity:  Normal  Concentration:  Concentration: Fair and Attention Span: Fair  Recall:  Good  Fund of Knowledge: Good  Language: Good  Akathisia:  No  Handed:  Right  AIMS (if indicated): not done  Assets:  Communication Skills Desire for Improvement Resilience  ADL's:  Intact  Cognition: WNL  Sleep:  Fair   Screenings:   Assessment and Plan: Major depressive disorder, recurrent.  Generalized anxiety disorder.  Patient is a stable on her current medication.  Her major stress is her living situation.  She is working on her to move out.  She like to continue her medication and need a refill on her hydroxyzine and Ambien which she uses as needed.  Continue Wellbutrin XL 150 mg daily, Effexor XR 150 mg daily, Lamictal 200 mg daily.  Ambien 5 mg as needed for insomnia and hydroxyzine 25 mg as needed for anxiety.  Recommended to call us back if she is any question or any concern.  Follow-up in 3 months.     Cleotis NipperSyed T Deneisha Dade, MD 08/05/2018, 4:10 PM

## 2018-08-07 HISTORY — PX: JOINT REPLACEMENT: SHX530

## 2018-08-22 ENCOUNTER — Ambulatory Visit (HOSPITAL_COMMUNITY): Payer: PRIVATE HEALTH INSURANCE | Admitting: Psychiatry

## 2018-09-18 ENCOUNTER — Other Ambulatory Visit (HOSPITAL_COMMUNITY): Payer: Self-pay | Admitting: Psychiatry

## 2018-09-18 DIAGNOSIS — F330 Major depressive disorder, recurrent, mild: Secondary | ICD-10-CM

## 2018-09-27 ENCOUNTER — Other Ambulatory Visit (HOSPITAL_COMMUNITY): Payer: Self-pay

## 2018-09-27 DIAGNOSIS — F330 Major depressive disorder, recurrent, mild: Secondary | ICD-10-CM

## 2018-09-27 MED ORDER — ZOLPIDEM TARTRATE 5 MG PO TABS: 5.0000 mg | ORAL_TABLET | Freq: Every evening | ORAL | 0 refills | Status: DC | PRN

## 2018-10-24 ENCOUNTER — Encounter: Payer: Self-pay | Admitting: Gastroenterology

## 2018-11-04 ENCOUNTER — Ambulatory Visit (INDEPENDENT_AMBULATORY_CARE_PROVIDER_SITE_OTHER): Payer: PRIVATE HEALTH INSURANCE | Admitting: Psychiatry

## 2018-11-04 ENCOUNTER — Other Ambulatory Visit: Payer: Self-pay

## 2018-11-04 VITALS — BP 148/89 | HR 80 | Wt 189.0 lb

## 2018-11-04 DIAGNOSIS — F330 Major depressive disorder, recurrent, mild: Secondary | ICD-10-CM | POA: Diagnosis not present

## 2018-11-04 DIAGNOSIS — F411 Generalized anxiety disorder: Secondary | ICD-10-CM

## 2018-11-04 MED ORDER — BUPROPION HCL ER (XL) 150 MG PO TB24: 150.0000 mg | ORAL_TABLET | Freq: Every morning | ORAL | 0 refills | Status: DC

## 2018-11-04 MED ORDER — LAMOTRIGINE 200 MG PO TABS: 200.0000 mg | ORAL_TABLET | Freq: Every day | ORAL | 0 refills | Status: DC

## 2018-11-04 MED ORDER — VENLAFAXINE HCL ER 150 MG PO CP24: ORAL_CAPSULE | ORAL | 0 refills | Status: DC

## 2018-11-04 MED ORDER — ZOLPIDEM TARTRATE 5 MG PO TABS: 5.0000 mg | ORAL_TABLET | Freq: Every evening | ORAL | 0 refills | Status: DC | PRN

## 2018-11-04 NOTE — Progress Notes (Signed)
Virtual Visit via Telephone Note  I connected with [name] [name] on 11/04/18 at  2:00 PM EDT by telephone and verified that I am speaking with the correct person using two identifiers.   I discussed the limitations, risks, security and privacy concerns of performing an evaluation and management service by telephone and the availability of in person appointments. I also discussed with the patient that there may be a patient responsible charge related to this service. The patient expressed understanding and agreed to proceed.   History of Present Illness: Patient came to the office.  She was evaluated through phone due to current COVID-19 pandemic coronavirus.  She did not have a correct phone number and did not receive the message about telephone session.  Patient told now she is moved from her partner and living in [place].  She is much happy as she is separated.  She feels things are going very well and she does not recall any crying spells, feeling of hopelessness or worthlessness.  She has cut down her hydroxyzine and also rarely takes the Ambien.  She is in touch with her elderly parents.  She instructed them not to leave the house because of pandemic and she is trying to leave the grocery on the porch on a regular basis.  Recently she had hand surgery and she is feeling better.  She is prescribed pain medicine which she takes on and off.  She denies any rash, itching and like to continue her current medication.  Her appetite is okay.  She feel her energy level is good.  Her vitals done.  Her blood pressure is slightly high and she gained 5 pounds but she believe due to inactivity due to pandemic virus.  Today's Vitals   11/04/18 1422  BP: (!) 148/89  Pulse: 80  Weight: 189 lb (85.7 kg)   Body mass index is 36.91 kg/m.  Past Psychiatric History: Reviewed. H/O inpatient hospitalization in 2010 when brother committed suicide.  Took antidepressant since 2008.  Tried Prozac (manic), Paxil (did  not help) and Abilify (headaches and diarrhea).  No h/o suicidal attempt or hallucinations.    Mental Status Examination; Limited mental stat examination done due to phone conversation.  She describes her mood is good.  She is relevant in conversation.  Her speech is clear, coherent with normal tone.  She denies any auditory or visual hallucination.  She denies any suicidal thoughts or homicidal thought.  She does not appear to be distracted on phone conversation.  She is alert and oriented x3.  Her cognition is intact.  There were no delusions, paranoia or any suicidal thoughts or homicidal thought.  Her fund of knowledge is adequate.  Her insight judgment is good.  Assessment and Plan: Major depressive disorder, recurrent.  Generalized anxiety disorder.  Patient is a stable on her current medication.  She has no rash, itching or tremors.  She does not take hydroxyzine and rarely takes Ambien as sleep is improved since she moved out from her partner.  She like to continue her standing medication which are Wellbutrin XL 150 mg daily, Effexor XR 150 mg daily and Lamictal to her milligrams daily.  Recommended to call us back if she is any question or any concern.  I will see her again in 3 months.  Follow Up Instructions:    I discussed the assessment and treatment plan with the patient. The patient was provided an opportunity to ask questions and all were answered. The patient agreed with  the plan and demonstrated an understanding of the instructions.   The patient was advised to call back or seek an in-person evaluation if the symptoms worsen or if the condition fails to improve as anticipated.  I provided 15 minutes of non-face-to-face time during this encounter.   Kathlee Nations, MD

## 2018-12-12 ENCOUNTER — Other Ambulatory Visit (HOSPITAL_COMMUNITY): Payer: Self-pay

## 2018-12-12 DIAGNOSIS — F330 Major depressive disorder, recurrent, mild: Secondary | ICD-10-CM

## 2018-12-12 MED ORDER — ZOLPIDEM TARTRATE 5 MG PO TABS: 5.0000 mg | ORAL_TABLET | Freq: Every evening | ORAL | 0 refills | Status: DC | PRN

## 2019-01-06 ENCOUNTER — Telehealth (HOSPITAL_COMMUNITY): Payer: Self-pay

## 2019-01-06 DIAGNOSIS — F330 Major depressive disorder, recurrent, mild: Secondary | ICD-10-CM

## 2019-01-06 NOTE — Telephone Encounter (Signed)
Patient called requesting a refill on her Ambien 5mg . The sig is for 1 tablet PRN for sleep but she stated that she has been needing to take it every night and stated she prefers enough to get her through to her next appointment. She has a scheduled appointment on 02/04/19. Please review and advise. Thank you.

## 2019-01-06 NOTE — Telephone Encounter (Signed)
error 

## 2019-01-07 MED ORDER — ZOLPIDEM TARTRATE 5 MG PO TABS: 5.0000 mg | ORAL_TABLET | Freq: Every day | ORAL | 0 refills | Status: DC

## 2019-01-07 NOTE — Telephone Encounter (Signed)
Refill done at [facility], [place]

## 2019-02-04 ENCOUNTER — Other Ambulatory Visit: Payer: Self-pay

## 2019-02-04 ENCOUNTER — Encounter (HOSPITAL_COMMUNITY): Payer: Self-pay | Admitting: Psychiatry

## 2019-02-04 ENCOUNTER — Ambulatory Visit (INDEPENDENT_AMBULATORY_CARE_PROVIDER_SITE_OTHER): Payer: PRIVATE HEALTH INSURANCE | Admitting: Psychiatry

## 2019-02-04 DIAGNOSIS — F330 Major depressive disorder, recurrent, mild: Secondary | ICD-10-CM

## 2019-02-04 DIAGNOSIS — F411 Generalized anxiety disorder: Secondary | ICD-10-CM

## 2019-02-04 MED ORDER — BUPROPION HCL ER (XL) 150 MG PO TB24: 150.0000 mg | ORAL_TABLET | Freq: Every morning | ORAL | 0 refills | Status: DC

## 2019-02-04 MED ORDER — LAMOTRIGINE 200 MG PO TABS: 200.0000 mg | ORAL_TABLET | Freq: Every day | ORAL | 0 refills | Status: DC

## 2019-02-04 MED ORDER — ZOLPIDEM TARTRATE 5 MG PO TABS: 5.0000 mg | ORAL_TABLET | Freq: Every day | ORAL | 0 refills | Status: DC

## 2019-02-04 MED ORDER — VENLAFAXINE HCL ER 150 MG PO CP24: ORAL_CAPSULE | ORAL | 0 refills | Status: DC

## 2019-02-04 NOTE — Progress Notes (Signed)
Virtual Visit via Telephone Note  I connected with [name] [name] on 02/04/19 at  2:00 PM EDT by telephone and verified that I am speaking with the correct person using two identifiers.   I discussed the limitations, risks, security and privacy concerns of performing an evaluation and management service by telephone and the availability of in person appointments. I also discussed with the patient that there may be a patient responsible charge related to this service. The patient expressed understanding and agreed to proceed.   History of Present Illness: Patient was evaluated by phone session.  She is doing well on her medication.  Recently she had a hand surgery.  Earlier she had surgery on her left hand and 2 days ago she had a surgery on the right hand.  She is doing better.  She is taking some time pain medication.  She decided to stay with [name] [name] as she believes things are going well.  She is actually surprised that her partner [name] [name] did help her a lot during this difficult time when she is having surgery.  She wants to give more time as she feels the relationship is going well.  She admitted few weeks ago she was having a hard time sleeping the nights when she was not taking Ambien so she started taking every night.  Now she is back to take Ambien only as needed.  She feels the current medicine is working.  She denies any crying spells or any feeling of hopelessness or worthlessness.  She is not taking hydroxyzine.  She is compliant with Lamictal, Wellbutrin, Effexor and Ambien.  Her appetite is okay.  Her energy level is good.  She is taking extra precaution when she goes outside due to COVID-19.  She denies drinking or using any illegal substances.    Past Psychiatric History:Reviewed. H/O inpatienthospitalization in 2010 when brother committed suicide.Took antidepressant since 2008. Tried Prozac(manic), Paxil (did not help) and Abilify (headaches and diarrhea). No h/o suicidal attempt or  hallucinations.    Psychiatric Specialty Exam: Physical Exam  ROS  There were no vitals taken for this visit.There is no height or weight on file to calculate BMI.  General Appearance: NA  Eye Contact:  NA  Speech:  Clear and Coherent and Slow  Volume:  Decreased  Mood:  Anxious  Affect:  NA  Thought Process:  Goal Directed  Orientation:  Full (Time, Place, and Person)  Thought Content:  Rumination  Suicidal Thoughts:  No  Homicidal Thoughts:  No  Memory:  Immediate;   Good Recent;   Good Remote;   Good  Judgement:  Good  Insight:  Good  Psychomotor Activity:  NA  Concentration:  Concentration: Fair and Attention Span: Fair  Recall:  Good  Fund of Knowledge:  Good  Language:  Good  Akathisia:  No  Handed:  Right  AIMS (if indicated):     Assets:  Communication Skills Desire for Improvement Housing Resilience Social Support  ADL's:  Intact  Cognition:  WNL  Sleep:         Assessment and Plan: Major depressive disorder, recurrent.  Generalized anxiety disorder.  Patient doing better and she decided to stay with her partner [name] [name] as things are going well.  She is happy that her partner is helping during the difficult time when she had a surgery.  She like to continue her current medication.  She has no rash, itching, tremors or shakes.  Continue Wellbutrin XL 150 mg daily, Effexor XR 150  mg daily, Lamictal 200 mg daily and Ambien 5 mg as needed for insomnia.  Recommended to call us back if she has any question or any concern.  Follow-up in 3 months.  Follow Up Instructions:    I discussed the assessment and treatment plan with the patient. The patient was provided an opportunity to ask questions and all were answered. The patient agreed with the plan and demonstrated an understanding of the instructions.   The patient was advised to call back or seek an in-person evaluation if the symptoms worsen or if the condition fails to improve as anticipated.  I provided 15  minutes of non-face-to-face time during this encounter.   Cleotis NipperSyed T Maeson Purohit, MD

## 2019-04-04 ENCOUNTER — Other Ambulatory Visit (HOSPITAL_COMMUNITY): Payer: Self-pay | Admitting: Psychiatry

## 2019-04-04 DIAGNOSIS — F330 Major depressive disorder, recurrent, mild: Secondary | ICD-10-CM

## 2019-04-16 ENCOUNTER — Other Ambulatory Visit (HOSPITAL_COMMUNITY): Payer: Self-pay | Admitting: Psychiatry

## 2019-04-16 DIAGNOSIS — F330 Major depressive disorder, recurrent, mild: Secondary | ICD-10-CM

## 2019-04-21 ENCOUNTER — Other Ambulatory Visit (HOSPITAL_COMMUNITY): Payer: Self-pay

## 2019-04-21 DIAGNOSIS — F330 Major depressive disorder, recurrent, mild: Secondary | ICD-10-CM

## 2019-04-21 MED ORDER — ZOLPIDEM TARTRATE 5 MG PO TABS: 5.0000 mg | ORAL_TABLET | Freq: Every day | ORAL | 0 refills | Status: DC

## 2019-05-07 ENCOUNTER — Encounter (HOSPITAL_COMMUNITY): Payer: Self-pay | Admitting: Psychiatry

## 2019-05-07 ENCOUNTER — Other Ambulatory Visit: Payer: Self-pay

## 2019-05-07 ENCOUNTER — Ambulatory Visit (INDEPENDENT_AMBULATORY_CARE_PROVIDER_SITE_OTHER): Payer: PRIVATE HEALTH INSURANCE | Admitting: Psychiatry

## 2019-05-07 VITALS — Wt 185.0 lb

## 2019-05-07 DIAGNOSIS — F330 Major depressive disorder, recurrent, mild: Secondary | ICD-10-CM | POA: Diagnosis not present

## 2019-05-07 DIAGNOSIS — F411 Generalized anxiety disorder: Secondary | ICD-10-CM | POA: Diagnosis not present

## 2019-05-07 DIAGNOSIS — F410 Panic disorder [episodic paroxysmal anxiety] without agoraphobia: Secondary | ICD-10-CM | POA: Diagnosis not present

## 2019-05-07 MED ORDER — VENLAFAXINE HCL ER 150 MG PO CP24: ORAL_CAPSULE | ORAL | 0 refills | Status: DC

## 2019-05-07 MED ORDER — ZOLPIDEM TARTRATE 5 MG PO TABS: 5.0000 mg | ORAL_TABLET | Freq: Every day | ORAL | 0 refills | Status: DC

## 2019-05-07 MED ORDER — BUPROPION HCL ER (XL) 150 MG PO TB24: 150.0000 mg | ORAL_TABLET | Freq: Every morning | ORAL | 0 refills | Status: DC

## 2019-05-07 MED ORDER — LAMOTRIGINE 200 MG PO TABS: 200.0000 mg | ORAL_TABLET | Freq: Every day | ORAL | 0 refills | Status: DC

## 2019-05-07 MED ORDER — LORAZEPAM 0.5 MG PO TABS: 0.5000 mg | ORAL_TABLET | Freq: Every day | ORAL | 0 refills | Status: DC | PRN

## 2019-05-07 NOTE — Progress Notes (Signed)
Virtual Visit via Telephone Note  I connected with [name] [name] on 05/07/19 at  2:00 PM EDT by telephone and verified that I am speaking with the correct person using two identifiers.   I discussed the limitations, risks, security and privacy concerns of performing an evaluation and management service by telephone and the availability of in person appointments. I also discussed with the patient that there may be a patient responsible charge related to this service. The patient expressed understanding and agreed to proceed.   History of Present Illness: Patient was evaluated by phone session.  She recently had surgery on her right hand and she is slowly and gradually recovering.  She continues to have issues with her wife [name] [name].  Patient told probably had also surgery recently and she try to help her but 1 of the other friend had health issues and she went to help her friend but [name] [name] did not like and she got upset.  She reported sometimes she has panic attack because [name] [name] started yelling at her.  She has to leave the house for walk off to avoid herself getting upset.  She is taking Ambien as needed which is helping her sleep.  She is requesting something to help these panic attacks.  She had tried hydroxyzine but that did not help her.  She is no longer taking any narcotic pain medication her physician recently started her on Mobic but she does not feel it is more.  Slowly and gradually her pain is getting better.  She denies any crying spells or any feeling of hopelessness.  She knows [name] [name]'s nature and she has dealt with her in the past.  She wants to continue her medication but wondering if something else she can take to help her these attacks.  Her appetite is okay.  She is more active and claims that she has lost 5 pounds but did not provide the exact weight.  She denies any hallucination, paranoia or any suicidal thoughts.  She has no tremors, shakes or any rash.  She is not drinking or using any  illegal substances.    Past Psychiatric History:Reviewed. H/Oinpatienthospitalization in 2010 when brother committed suicide.Took antidepressant since 2008. Tried Prozac(manic),Paxil(didnot help) andAbilify(headaches and diarrhea). No h/osuicidal attempt or hallucinations.    Psychiatric Specialty Exam: Physical Exam  ROS  There were no vitals taken for this visit.There is no height or weight on file to calculate BMI.  General Appearance: NA  Eye Contact:  NA  Speech:  Clear and Coherent and Normal Rate  Volume:  Normal  Mood:  Anxious  Affect:  NA  Thought Process:  Goal Directed  Orientation:  Full (Time, Place, and Person)  Thought Content:  Rumination  Suicidal Thoughts:  No  Homicidal Thoughts:  No  Memory:  Immediate;   Good Recent;   Good Remote;   Good  Judgement:  Good  Insight:  Good  Psychomotor Activity:  NA  Concentration:  Concentration: Fair and Attention Span: Fair  Recall:  Good  Fund of Knowledge:  Good  Language:  Good  Akathisia:  No  Handed:  Right  AIMS (if indicated):     Assets:  Communication Skills Desire for Improvement Housing Resilience  ADL's:  Intact  Cognition:  WNL  Sleep:   good      Assessment and Plan: Major depressive disorder, recurrent.  Generalized anxiety disorder.  Panic attack.  Discussed her psychosocial issues.  I have recommended therapy but at this time patient is not  ready.  I will discontinue hydroxyzine and we will try low-dose lorazepam 0.5 mg to take as needed for severe panic attack.  Discussed benzodiazepine and hypnotics interaction, side effects, withdrawal symptoms and dependency.  She takes Ambien only as needed.  Continue Ambien 5 mg to take 2-3 times a week, continue Wellbutrin XL 150 mg daily, continue Effexor and 50 mg daily and Lamictal 200 mg daily.  We will try lorazepam 0.5 mg to help these panic attacks.  Recommended to call us back if she has any question or any concern.  Follow-up.  3  months.  Time spent 30 minutes more than 50% of the time spent in psychoeducation, counseling, coronation of care and long-term prognosis.  Follow Up Instructions:    I discussed the assessment and treatment plan with the patient. The patient was provided an opportunity to ask questions and all were answered. The patient agreed with the plan and demonstrated an understanding of the instructions.   The patient was advised to call back or seek an in-person evaluation if the symptoms worsen or if the condition fails to improve as anticipated.  I provided 30 minutes of non-face-to-face time during this encounter.   Kathlee Nations, MD

## 2019-07-21 ENCOUNTER — Ambulatory Visit (HOSPITAL_COMMUNITY): Payer: PRIVATE HEALTH INSURANCE | Admitting: Psychiatry

## 2019-07-23 ENCOUNTER — Ambulatory Visit (HOSPITAL_COMMUNITY): Payer: PRIVATE HEALTH INSURANCE | Admitting: Psychiatry

## 2019-09-05 ENCOUNTER — Other Ambulatory Visit (HOSPITAL_COMMUNITY): Payer: Self-pay | Admitting: *Deleted

## 2019-09-05 DIAGNOSIS — F330 Major depressive disorder, recurrent, mild: Secondary | ICD-10-CM

## 2019-09-05 MED ORDER — LAMOTRIGINE 200 MG PO TABS: 200.0000 mg | ORAL_TABLET | Freq: Every day | ORAL | 0 refills | Status: DC

## 2019-09-19 ENCOUNTER — Other Ambulatory Visit: Payer: Self-pay

## 2019-09-19 ENCOUNTER — Ambulatory Visit (INDEPENDENT_AMBULATORY_CARE_PROVIDER_SITE_OTHER): Payer: PRIVATE HEALTH INSURANCE | Admitting: Psychiatry

## 2019-09-19 ENCOUNTER — Encounter (HOSPITAL_COMMUNITY): Payer: Self-pay | Admitting: Psychiatry

## 2019-09-19 DIAGNOSIS — F410 Panic disorder [episodic paroxysmal anxiety] without agoraphobia: Secondary | ICD-10-CM

## 2019-09-19 DIAGNOSIS — F330 Major depressive disorder, recurrent, mild: Secondary | ICD-10-CM | POA: Diagnosis not present

## 2019-09-19 DIAGNOSIS — F411 Generalized anxiety disorder: Secondary | ICD-10-CM | POA: Diagnosis not present

## 2019-09-19 MED ORDER — VENLAFAXINE HCL ER 150 MG PO CP24: ORAL_CAPSULE | ORAL | 0 refills | Status: DC

## 2019-09-19 MED ORDER — BUPROPION HCL ER (XL) 150 MG PO TB24: 150.0000 mg | ORAL_TABLET | Freq: Every morning | ORAL | 0 refills | Status: DC

## 2019-09-19 MED ORDER — LAMOTRIGINE 200 MG PO TABS: 200.0000 mg | ORAL_TABLET | Freq: Every day | ORAL | 0 refills | Status: DC

## 2019-09-19 NOTE — Progress Notes (Signed)
Virtual Visit via Telephone Note  I connected with [name] [name] on 09/19/19 at  9:40 AM EST by telephone and verified that I am speaking with the correct person using two identifiers.   I discussed the limitations, risks, security and privacy concerns of performing an evaluation and management service by telephone and the availability of in person appointments. I also discussed with the patient that there may be a patient responsible charge related to this service. The patient expressed understanding and agreed to proceed.   History of Present Illness: Patient was evaluated by phone session.  She is upset because she did not have refills on venlafaxine.  Patient was realized that she had missed 2 appointments and she apologized.  Patient told her mother died on 01-09-25and she was busy taking care of her and now father.  She is a staying with her father.  Patient reported her wife [name] [name] continue to try to get together but she is keeping her distance and they have promised to just see each other twice a week.  She feels the current medicine is working and does not need lorazepam and Ambien because she is sleeping good.  We have given few tablets of lorazepam for panic attacks but she had not used these pills in a while.  She like to continue venlafaxine, Wellbutrin and Lamictal.  She denies any paranoia, hallucination or any suicidal thoughts.  She denies any crying spells since she had decided her limits with [name] [name] she is more calm.  Past Psychiatric History:Reviewed. H/Oinpatientin 2010 when brother committed suicide.Took meds since 2008. Took Prozac(manic),Paxil(didnot help) andAbilify(headaches and diarrhea). No h/osuicidal attempt or hallucinations.     Psychiatric Specialty Exam: Physical Exam  Review of Systems  There were no vitals taken for this visit.There is no height or weight on file to calculate BMI.  General Appearance: NA  Eye Contact:  NA  Speech:  Clear and  Coherent  Volume:  Normal  Mood:  Anxious and Irritable  Affect:  NA  Thought Process:  Goal Directed  Orientation:  Full (Time, Place, and Person)  Thought Content:  Rumination  Suicidal Thoughts:  No  Homicidal Thoughts:  No  Memory:  Immediate;   Good Recent;   Good Remote;   Good  Judgement:  Fair  Insight:  Fair  Psychomotor Activity:  NA  Concentration:  Concentration: Fair and Attention Span: Fair  Recall:  Good  Fund of Knowledge:  Good  Language:  Good  Akathisia:  No  Handed:  Right  AIMS (if indicated):     Assets:  Communication Skills Desire for Improvement Housing Resilience Social Support  ADL's:  Intact  Cognition:  WNL  Sleep:   ok      Assessment and Plan: Major depressive disorder, recurrent.  Generalized anxiety disorder.  Panic attacks.  Reinforce compliance with follow-ups and refills.  She apologized and agree that she will keep the appointments.  Her panic attacks are under control and she does not need a new prescription of lorazepam.  She is sleeping better and does not need Ambien.  However she like to keep Lamictal, Wellbutrin and venlafaxine which is helping her mood and anxiety.  Discussed medication side effects and benefits.  She does not need therapy.  Continue venlafaxine 150 mg daily, Lamictal 200 mg daily and Wellbutrin XL 150 mg daily. Follow up in 3 months.   Follow Up Instructions:    I discussed the assessment and treatment plan with the patient. The  patient was provided an opportunity to ask questions and all were answered. The patient agreed with the plan and demonstrated an understanding of the instructions.   The patient was advised to call back or seek an in-person evaluation if the symptoms worsen or if the condition fails to improve as anticipated.  I provided 20 minutes of non-face-to-face time during this encounter.   Michelle Vanhise T Neelah Mannings, MD   

## 2019-11-20 ENCOUNTER — Telehealth (HOSPITAL_COMMUNITY): Payer: Self-pay

## 2019-11-20 DIAGNOSIS — F330 Major depressive disorder, recurrent, mild: Secondary | ICD-10-CM

## 2019-11-20 MED ORDER — LAMOTRIGINE 200 MG PO TABS: 200.0000 mg | ORAL_TABLET | Freq: Every day | ORAL | 0 refills | Status: DC

## 2019-11-20 NOTE — Telephone Encounter (Signed)
Pt called requesting refills of lamotrigine 200 mg tabs and zolpidem 5mg  tabs. Next appointment 12/17/19. If appropriate please send to [facility] in [place].

## 2019-11-20 NOTE — Telephone Encounter (Signed)
Done

## 2019-11-21 ENCOUNTER — Telehealth (HOSPITAL_COMMUNITY): Payer: Self-pay

## 2019-11-21 NOTE — Telephone Encounter (Signed)
Patient called requesting a refill on her Zolpidem 5mg  to be sent to [facility] on [address] in [place]. She has a scheduled appointment on 12/17/19. Thank you.

## 2019-11-21 NOTE — Telephone Encounter (Signed)
Yesterday message was about Lamictal which was done. Not sure if she is requesting Ambien.  I thought she stopped taking Ambien but if she requesting Ambien please call 15 tablets until her next appointment.  Thanks

## 2019-11-21 NOTE — Telephone Encounter (Signed)
Called patient to verify that she's still taking the Zolpidem (Ambien) 5mg  and relay message from doctor. Patient didn't pick up so I LVM to call me back.

## 2019-11-24 ENCOUNTER — Encounter (HOSPITAL_COMMUNITY): Payer: Self-pay | Admitting: Psychiatry

## 2019-11-24 ENCOUNTER — Other Ambulatory Visit: Payer: Self-pay

## 2019-11-24 ENCOUNTER — Ambulatory Visit (INDEPENDENT_AMBULATORY_CARE_PROVIDER_SITE_OTHER): Payer: PRIVATE HEALTH INSURANCE | Admitting: Psychiatry

## 2019-11-24 DIAGNOSIS — F330 Major depressive disorder, recurrent, mild: Secondary | ICD-10-CM | POA: Diagnosis not present

## 2019-11-24 DIAGNOSIS — F411 Generalized anxiety disorder: Secondary | ICD-10-CM

## 2019-11-24 MED ORDER — ZOLPIDEM TARTRATE 5 MG PO TABS: 5.0000 mg | ORAL_TABLET | Freq: Every day | ORAL | 1 refills | Status: DC

## 2019-11-24 MED ORDER — VENLAFAXINE HCL ER 150 MG PO CP24: ORAL_CAPSULE | ORAL | 0 refills | Status: DC

## 2019-11-24 MED ORDER — LAMOTRIGINE 200 MG PO TABS: 200.0000 mg | ORAL_TABLET | Freq: Every day | ORAL | 0 refills | Status: DC

## 2019-11-24 MED ORDER — BUPROPION HCL ER (XL) 150 MG PO TB24: 150.0000 mg | ORAL_TABLET | Freq: Every morning | ORAL | 0 refills | Status: DC

## 2019-11-24 NOTE — Progress Notes (Signed)
Virtual Visit via Telephone Note  I connected with [name] [name] on 11/24/19 at  1:00 PM EDT by telephone and verified that I am speaking with the correct person using two identifiers.   I discussed the limitations, risks, security and privacy concerns of performing an evaluation and management service by telephone and the availability of in person appointments. I also discussed with the patient that there may be a patient responsible charge related to this service. The patient expressed understanding and agreed to proceed.   History of Present Illness: Patient was evaluated by phone session.  She has been struggling with sleep lately.  She is having shoulder pain and neck pain.  Recently she seen the physician and may require surgery.  However her insurance recommended to do physical therapy before the surgery and she is currently doing physical therapy.  She used to take Ambien for sleep.  She like to have Ambien 5 mg so if she needed she can take it.  She is no longer taking meloxicam and Robaxin.  Recently her physician started gabapentin to help with pain.  She is living with her 77 year old father since mother died back in August 12, 2023.  She is trying to limit contact with [name] and she feels still relationship to staff and there are days when she is fine and then there are other days when she is keeping distance.  However she may contact at least once a week with [name].  111 is fair.  She denies any crying spells or any feeling of hopelessness or worthlessness.  She started walking with her father every day and that helps her weight under control.  She has no tremors shakes or any EPS.  She has no rash or any itching.  She has no panic attacks.   Past Psychiatric History:Reviewed. H/Oinpatientin 2010 when brother committed suicide.Took meds since 2008. Took Prozac(manic),Paxil(didnot help) andAbilify(headaches and diarrhea). No h/osuicidal attempt or hallucinations.     Psychiatric  Specialty Exam: Physical Exam  Review of Systems  Musculoskeletal: Positive for neck pain.       Shoulder pain    There were no vitals taken for this visit.There is no height or weight on file to calculate BMI.  General Appearance: NA  Eye Contact:  NA  Speech:  Clear and Coherent  Volume:  Normal  Mood:  Anxious  Affect:  NA  Thought Process:  Descriptions of Associations: Intact  Orientation:  Full (Time, Place, and Person)  Thought Content:  Rumination  Suicidal Thoughts:  No  Homicidal Thoughts:  No  Memory:  Immediate;   Good Recent;   Good Remote;   Good  Judgement:  Intact  Insight:  Present  Psychomotor Activity:  NA  Concentration:  Concentration: Fair and Attention Span: Fair  Recall:  Good  Fund of Knowledge:  Good  Language:  Good  Akathisia:  No  Handed:  Right  AIMS (if indicated):     Assets:  Communication Skills Desire for Improvement Housing Resilience  ADL's:  Intact  Cognition:  WNL  Sleep:   fair      Assessment and Plan: Major depressive disorder, recurrent.  Generalized anxiety disorder.  Patient like to go back on Ambien since having insomnia because of neck and shoulder pain.  Despite taking gabapentin is still struggle with sleep.  She had a good response with Ambien 5 mg we will resume Ambien but recommend not to take it every night to build the dependency.  She agreed with the plan.  We will provide Ambien 5 mg number 20 tablets with 1 more refill.  Continue venlafaxine 150 mg daily, Lamictal 200 mg daily and Wellbutrin XL 150 mg daily.  Recommended to call us back if is any question or any concern.  I reviewed recent blood work results which was done in March.  She has high cholesterol and she is trying to lose weight.  Her basic chemistry is normal.  Follow Up Instructions:    I discussed the assessment and treatment plan with the patient. The patient was provided an opportunity to ask questions and all were answered. The patient agreed  with the plan and demonstrated an understanding of the instructions.   The patient was advised to call back or seek an in-person evaluation if the symptoms worsen or if the condition fails to improve as anticipated.  I provided 20 minutes of non-face-to-face time during this encounter.   Cleotis Nipper, MD

## 2019-12-17 ENCOUNTER — Ambulatory Visit (HOSPITAL_COMMUNITY): Payer: PRIVATE HEALTH INSURANCE | Admitting: Psychiatry

## 2020-01-14 ENCOUNTER — Telehealth (HOSPITAL_COMMUNITY): Payer: Self-pay

## 2020-01-14 ENCOUNTER — Other Ambulatory Visit (HOSPITAL_COMMUNITY): Payer: Self-pay

## 2020-01-14 DIAGNOSIS — F330 Major depressive disorder, recurrent, mild: Secondary | ICD-10-CM

## 2020-01-14 MED ORDER — LAMOTRIGINE 200 MG PO TABS: 200.0000 mg | ORAL_TABLET | Freq: Every day | ORAL | 1 refills | Status: DC

## 2020-01-14 NOTE — Telephone Encounter (Signed)
Received a fax from pharmacy regarding patient's Lamotrigine 200mg . I spoke with patient and she stated she is out so I sent in #30 with 1 refill - enough to get her to her appointment on 02/26/20. She also stated that she's almost out of her Zolpidem 5mg  and that her situation has changed so she requested a 30 day supply this time. Please review and advise. Thank you.

## 2020-01-15 MED ORDER — ZOLPIDEM TARTRATE 5 MG PO TABS: 5.0000 mg | ORAL_TABLET | Freq: Every day | ORAL | 1 refills | Status: DC

## 2020-01-15 NOTE — Telephone Encounter (Signed)
She supposed to take 20 tablets in 1 month.  Not sure if she used her last refill?  I have sent a new prescription but she should not be exceed more than 20 tablets in a 1 month.

## 2020-01-20 ENCOUNTER — Other Ambulatory Visit: Payer: Self-pay | Admitting: Orthopaedic Surgery

## 2020-01-20 DIAGNOSIS — M13812 Other specified arthritis, left shoulder: Secondary | ICD-10-CM

## 2020-01-22 NOTE — Telephone Encounter (Signed)
error 

## 2020-02-03 ENCOUNTER — Ambulatory Visit
Admission: RE | Admit: 2020-02-03 | Discharge: 2020-02-03 | Disposition: A | Discharge status: Home / Self Care | Payer: Medicare Other | Source: Ambulatory Visit | Attending: Orthopaedic Surgery | Admitting: Orthopaedic Surgery

## 2020-02-03 DIAGNOSIS — M13812 Other specified arthritis, left shoulder: Secondary | ICD-10-CM

## 2020-02-13 ENCOUNTER — Other Ambulatory Visit (HOSPITAL_COMMUNITY): Payer: Self-pay | Admitting: Psychiatry

## 2020-02-13 DIAGNOSIS — F330 Major depressive disorder, recurrent, mild: Secondary | ICD-10-CM

## 2020-02-13 DIAGNOSIS — F411 Generalized anxiety disorder: Secondary | ICD-10-CM

## 2020-02-26 ENCOUNTER — Telehealth (INDEPENDENT_AMBULATORY_CARE_PROVIDER_SITE_OTHER): Payer: Medicare Other | Admitting: Psychiatry

## 2020-02-26 ENCOUNTER — Other Ambulatory Visit: Payer: Self-pay

## 2020-02-26 ENCOUNTER — Encounter (HOSPITAL_COMMUNITY): Payer: Self-pay | Admitting: Psychiatry

## 2020-02-26 DIAGNOSIS — F330 Major depressive disorder, recurrent, mild: Secondary | ICD-10-CM | POA: Diagnosis not present

## 2020-02-26 DIAGNOSIS — F411 Generalized anxiety disorder: Secondary | ICD-10-CM | POA: Diagnosis not present

## 2020-02-26 MED ORDER — VENLAFAXINE HCL ER 150 MG PO CP24: ORAL_CAPSULE | ORAL | 0 refills | Status: DC

## 2020-02-26 MED ORDER — ZOLPIDEM TARTRATE 5 MG PO TABS: 5.0000 mg | ORAL_TABLET | Freq: Every day | ORAL | 0 refills | Status: DC

## 2020-02-26 MED ORDER — BUPROPION HCL ER (XL) 150 MG PO TB24: 150.0000 mg | ORAL_TABLET | Freq: Every morning | ORAL | 0 refills | Status: DC

## 2020-02-26 MED ORDER — LAMOTRIGINE 200 MG PO TABS: 200.0000 mg | ORAL_TABLET | Freq: Every day | ORAL | 0 refills | Status: DC

## 2020-02-26 NOTE — Progress Notes (Addendum)
Virtual Visit via Telephone Note  I connected with [name] [name] on 02/26/20 at  1:20 PM EDT by telephone and verified that I am speaking with the correct person using two identifiers.  Location: Patient: home Provider: home office   I discussed the limitations, risks, security and privacy concerns of performing an evaluation and management service by telephone and the availability of in person appointments. I also discussed with the patient that there may be a patient responsible charge related to this service. The patient expressed understanding and agreed to proceed.   History of Present Illness: Patient evaluated by phone session.  She has a lot of shoulder pain and she is hoping to have a surgery soon.  She is relieved because her living situation is better and she is living with her elderly father.  She has been separated from her partner 6 months ago and she is still struggle with emotions, crying spells but realized that it is best for her to remain separated.  She takes Ambien that helps her sleep.  She denies any suicidal thoughts.  She denies any highs and lows.  She is compliant with medication and reported no concerns or side effects.  She like to keep her current medication.  Past Psychiatric History:Reviewed. H/Oinpatientin 2010 when brother committed suicide.Tookmedssince 2008. TookProzac(manic),Paxil(didnot help) andAbilify(headaches and diarrhea). No h/osuicidal attempt or hallucinations.     Psychiatric Specialty Exam: Physical Exam  Review of Systems  Musculoskeletal:       Shoulder pain    There were no vitals taken for this visit.There is no height or weight on file to calculate BMI.  General Appearance: NA  Eye Contact:  NA  Speech:  Clear and Coherent and Normal Rate  Volume:  Normal  Mood:  Euthymic  Affect:  NA  Thought Process:  Goal Directed  Orientation:  Full (Time, Place, and Person)  Thought Content:  Logical  Suicidal Thoughts:  No   Homicidal Thoughts:  No  Memory:  Immediate;   Good Recent;   Good Remote;   Good  Judgement:  Intact  Insight:  Present  Psychomotor Activity:  NA  Concentration:  Concentration: Good and Attention Span: Good  Recall:  Good  Fund of Knowledge:  Good  Language:  Good  Akathisia:  No  Handed:  Right  AIMS (if indicated):     Assets:  Communication Skills Desire for Improvement Housing Resilience Social Support  ADL's:  Intact  Cognition:  WNL  Sleep:   ok      Assessment and Plan: Major depressive disorder, recurrent.  Anxiety  Patient is a stable on her current medication.  She has shoulder pain and hoping to have a surgery soon.  She does not want to change the medication.  We will continue Ambien 5 to 10 mg as needed for insomnia, Wellbutrin XL 150 mg daily, Lamictal 200 mg daily and Effexor 150 mg daily.  Discussed medication side effects and benefits.  Recommended to call us back if there is any question or any concern.  Follow-up in 3 months.  Follow Up Instructions:    I discussed the assessment and treatment plan with the patient. The patient was provided an opportunity to ask questions and all were answered. The patient agreed with the plan and demonstrated an understanding of the instructions.   The patient was advised to call back or seek an in-person evaluation if the symptoms worsen or if the condition fails to improve as anticipated.  I provided 15  minutes of non-face-to-face time during this encounter.   Kathlee Nations, MD

## 2020-03-09 NOTE — Pre-Procedure Instructions (Signed)
[name] [name]  03/09/2020      [id number] - [place], [place] - [address] [place] [address] Phone: [phone] Fax: [phone]    Your procedure is scheduled on Aug. 12  Report to [facility] Entrance A at 5:30 A.M.  Call this number if you have problems the morning of surgery:  [phone]   Remember:  Do not eat  after midnight.   You may drink clear liquids until 4:30 A.M..  Clear liquids allowed are:                    Water, Juice (non-citric and without pulp - diabetics please choose diet or no sugar options), Carbonated beverages - (diabetics please choose diet or no sugar options), Clear Tea, Black Coffee only (no creamer, milk or cream including half and half), Plain Jell-O only (diabetics please choose diet or no sugar options), Gatorade (diabetics please choose diet or no sugar options) and Plain Popsicles only                      Enhanced Recovery after Surgery for Orthopedics Enhanced Recovery after Surgery is a protocol used to improve the stress on your body and your recovery after surgery.  Patient Instructions  . The night before surgery:  o No food after midnight. ONLY clear liquids after midnight  .  Marland Kitchen The day of surgery (if you do NOT have diabetes):  o Drink ONE (1) Pre-Surgery Clear Ensure by _____ am the morning of surgery   o This drink was given to you during your hospital  pre-op appointment visit. o Nothing else to drink after completing the  Pre-Surgery Clear Ensure.          If you have questions, please contact your surgeon's office.     Take these medicines the morning of surgery with A SIP OF WATER :               Bupropion (wellbutrin)              Gabapentin (neurontin)              Lamotrigine (lamictal)              Eye drops              Venlafaxine (effexor)               If needed:  Hydroxyzine (vistaril)      7 days prior to surgery STOP taking any Aspirin (unless otherwise  instructed by your surgeon), Aleve, Naproxen, Ibuprofen, Motrin, Advil, Goody's, BC's, all herbal medications, fish oil, and all vitamins.               [place]- Preparing for Shoulder Surgery  ?  Before surgery, you can play an important role. Because skin is not sterile, your skin needs to be as free of germs as possible. You can reduce the number of germs on your skin by using the following products.   1). Benzoyl Peroxide Gel: reduces the number of germs present on the skin   *Applied twice a day to shoulder area starting two days before surgery     2). Chlorhexidine Gluconate (CHG) Soap: An antiseptic cleaner that kills germs and bonds with the skin to continue killing germs even after washing   *Used for showering the night before surgery and morning of surgery   ?  Please follow these instructions carefully:     1). BENZOYL PEROXIDE 5% GEL (Please do not use if you have an allergy to benzoyl peroxide.   If your skin becomes reddened/irritated stop using the benzoyl peroxide)     Starting TWO DAYS BEFORE surgery:    Apply benzoyl peroxide in the morning and at night. Apply after taking a shower. If you are not taking a shower clean entire shoulder front, back, and side along with the armpit with a clean wet washcloth.     Place a quarter-sized amount of gel on your shoulder and rub in thoroughly, making sure to cover the front, back, and side of your shoulder, along with the armpit.                           Do this twice a day for two days.  (Last application is the night before surgery, AFTER using the CHG soap as described below).   Do NOT apply benzoyl peroxide gel on the day of surgery.   2 days before ____ AM   ____ PM              1 day before ____ AM   ____ PM    2) CHG Soap: Please do not use if you have an allergy to CHG or antibacterial soaps. If your skin becomes reddened/irritated stop using the CHG.  Do not shave (including legs and underarms) for  at least 48 hours prior to first CHG shower. It is OK to shave your face.  Please follow these instructions carefully.   1. Shower the NIGHT BEFORE SURGERY (before applying benzoyl peroxide gel) and the MORNING OF SURGERY with CHG Soap.   2. If you chose to wash your hair, wash your hair first as usual with your normal shampoo.  3. After you shampoo, rinse your hair and body thoroughly to remove the shampoo.  4. Use CHG as you would any other liquid soap. You can apply CHG directly to the skin and wash gently with a scrungie or a clean washcloth.   5. Apply the CHG Soap to your body ONLY FROM THE NECK DOWN.  Do not use on open wounds or open sores. Avoid contact with your eyes, ears, mouth and genitals (private parts). Wash Face and genitals (private parts) with your normal soap.   6. Wash thoroughly, paying special attention to the area where your surgery will be performed.  7. Thoroughly rinse your body with warm water from the neck down.  8. DO NOT shower/wash with your normal soap after using and rinsing off the CHG Soap.  9. Pat yourself dry with a CLEAN TOWEL.  10. Wear CLEAN PAJAMAS to bed the night before surgery  11. Place CLEAN SHEETS on your bed the night of your first shower and DO NOT SLEEP WITH PETS.  Oral Hygiene is also important to reduce your risk of infection.  Remember - BRUSH YOUR TEETH THE MORNING OF SURGERY WITH YOUR REGULAR TOOTHPASTE  Day of Surgery: Wear Clean/Comfortable clothing the morning of surgery Do not apply any deodorants/lotions.   Remember to brush your teeth WITH YOUR REGULAR TOOTHPASTE.     Do not wear jewelry, make-up or nail polish.  Do not wear lotions, powders, or perfumes, or deodorant.  Do not shave 48 hours prior to surgery.  Men may shave face and neck.  Do not bring valuables to the hospital.  [facility] is not responsible  for any belongings or valuables.  Contacts, dentures or bridgework may not be worn into surgery.  Leave  your suitcase in the car.  After surgery it may be brought to your room.  For patients admitted to the hospital, discharge time will be determined by your treatment team.  Patients discharged the day of surgery will not be allowed to drive home.   Please read over the following fact sheets that you were given.

## 2020-03-10 ENCOUNTER — Inpatient Hospital Stay (HOSPITAL_COMMUNITY)
Admission: RE | Admit: 2020-03-10 | Discharge: 2020-03-10 | Disposition: A | Discharge status: Home / Self Care | Payer: Medicare Other | Source: Ambulatory Visit

## 2020-03-16 ENCOUNTER — Other Ambulatory Visit (HOSPITAL_COMMUNITY): Payer: Medicare Other

## 2020-04-21 ENCOUNTER — Other Ambulatory Visit (HOSPITAL_COMMUNITY): Payer: Self-pay | Admitting: Psychiatry

## 2020-04-21 DIAGNOSIS — F330 Major depressive disorder, recurrent, mild: Secondary | ICD-10-CM

## 2020-04-21 DIAGNOSIS — F411 Generalized anxiety disorder: Secondary | ICD-10-CM

## 2020-04-22 ENCOUNTER — Ambulatory Visit: Admission: RE | Admit: 2020-04-22 | Payer: Medicare Other | Source: Home / Self Care | Admitting: Orthopaedic Surgery

## 2020-04-22 ENCOUNTER — Encounter: Admission: RE | Payer: Self-pay | Source: Home / Self Care

## 2020-04-22 SURGERY — ARTHROPLASTY, SHOULDER, TOTAL, REVERSE
Anesthesia: General | Site: Shoulder | Laterality: Left

## 2020-05-14 NOTE — Progress Notes (Addendum)
[facility] [address], [place] - [address] Paulo Fruit [place] [address] Phone: [phone] Fax: [phone]      Your procedure is scheduled on 05/20/20.  Report to [facility] Main Entrance "A" at 5:30 A.M., and check in at the Admitting office.  Call this number if you have problems the morning of surgery:  [phone]  Call [phone] if you have any questions prior to your surgery date Monday-Friday 8am-4pm    Remember:  Do not eat after midnight the night before your surgery  You may drink clear liquids until 4:30 the morning of your surgery.   Clear liquids allowed are: Water, Non-Citrus Juices (without pulp), Carbonated Beverages, Clear Tea, Black Coffee Only, and Gatorade  Please complete your PRE-SURGERY ENSURE that was provided to you by 4:30 the morning of surgery.  Please, if able, drink it in one setting. DO NOT SIP.    Take these medicines the morning of surgery with A SIP OF WATER: buPROPion (WELLBUTRIN XL) gabapentin (NEURONTIN)  lamoTRIgine (LAMICTAL) timolol (TIMOPTIC) - eye drops venlafaxine XR (EFFEXOR-XR)  As of today, STOP taking any Aspirin (unless otherwise instructed by your surgeon) Aleve, Naproxen, Ibuprofen, Motrin, Advil, Goody's, BC's, all herbal medications, fish oil, and all vitamins.                      Do not wear jewelry, make up, or nail polish            Do not wear lotions, powders, perfumes or deodorant.            Do not shave 48 hours prior to surgery.              Do not bring valuables to the hospital.            [facility] is not responsible for any belongings or valuables.  Do NOT Smoke (Tobacco/Vaping) or drink Alcohol 24 hours prior to your procedure If you use a CPAP at night, you may bring all equipment for your overnight stay.   Contacts, glasses, dentures or bridgework may not be worn into surgery.      For patients admitted to the hospital, discharge time will be determined by  your treatment team.   Patients discharged the day of surgery will not be allowed to drive home, and someone needs to stay with them for 24 hours.    Special instructions:   - Preparing For Surgery                                   [facility]- Preparing for Total Shoulder Arthroplasty   Before surgery, you can play an important role. Because skin is not sterile, your skin needs to be as free of germs as possible. You can reduce the number of germs on your skin by using the following products. . Benzoyl Peroxide Gel o Reduces the number of germs present on the skin o Applied twice a day to shoulder area starting two days before surgery   . Chlorhexidine Gluconate (CHG) Soap o An antiseptic cleaner that kills germs and bonds with the skin to continue killing germs even after washing o Used for showering the night before surgery and morning of surgery   Oral Hygiene is also important to reduce your risk of infection.  Remember - BRUSH YOUR TEETH THE MORNING OF SURGERY WITH YOUR REGULAR TOOTHPASTE  ==================================================================  Please follow these instructions carefully:  BENZOYL PEROXIDE 5% GEL  Please do not use if you have an allergy to benzoyl peroxide.   If your skin becomes reddened/irritated stop using the benzoyl peroxide.  Starting two days before surgery, apply as follows: 1. Apply benzoyl peroxide in the morning and at night. Apply after taking a shower. If you are not taking a shower clean entire shoulder front, back, and side along with the armpit with a clean wet washcloth.  2. Place a quarter-sized dollop on your shoulder and rub in thoroughly, making sure to cover the front, back, and side of your shoulder, along with the armpit.   2 days before ____ AM   ____ PM              1 day before ____ AM   ____ PM                            3.  Do this twice a day for two days.  (Last  application is the night before surgery, AFTER using the CHG soap as described below).  4. Do NOT apply benzoyl peroxide gel on the day of surgery.  CHLORHEXIDINE GLUCONATE (CHG) SOAP  Please do not use if you have an allergy to CHG or antibacterial soaps. If your skin becomes reddened/irritated stop using the CHG.   Do not shave (including legs and underarms) for at least 48 hours prior to first CHG shower. It is OK to shave your face.  Starting the night before surgery, use CHG soap as follows:  1. Shower the NIGHT BEFORE SURGERY and MORNING OF SURGERY with CHG.  2. If you choose to wash your hair, wash your hair first as usual with your normal shampoo.  3. After shampooing, rinse your hair and body thoroughly to remove the shampoo.  4. Use CHG as you would any other liquid soap.  You can apply CHG directly to the skin and wash gently with a scrungie or a clean washcloth.  5. Apply the CHG soap to your body ONLY FROM THE NECK DOWN.  Do not use on open wounds or open sores.  Avoid contact with your eyes, ears, mouth, and genitals (private parts).  Wash face and genitals (private parts) with your normal soap.  6. Wash thoroughly, paying special attention to the area where your surgery will be performed.  7. Thoroughly rinse your body with warm water from the neck down.  8. DO NOT shower/wash with your normal soap after using and rinsing off the CHG soap.   9. Pat yourself dry with a CLEAN TOWEL.   10.  Apply benzoyl peroxide.   11. Wear CLEAN PAJAMAS to bed the night before surgery; wear comfortable clothes the morning of surgery.  12. Place CLEAN SHEETS on your bed the night of your first shower and DO NOT SLEEP WITH PETS.  Day of Surgery: Shower as above Do not apply any deodorants/lotions.  Please wear clean clothes to the hospital/surgery center.   Remember to brush your teeth WITH YOUR REGULAR TOOTHPASTE.    Before surgery, you can play an important role. Because skin  is not sterile, your skin needs to be as free of germs as possible. You can reduce the number of germs on your skin by washing with CHG (chlorahexidine gluconate) Soap before surgery.  CHG is  an antiseptic cleaner which kills germs and bonds with the skin to continue killing germs even after washing.    Oral Hygiene is also important to reduce your risk of infection.  Remember - BRUSH YOUR TEETH THE MORNING OF SURGERY WITH YOUR REGULAR TOOTHPASTE  Please do not use if you have an allergy to CHG or antibacterial soaps. If your skin becomes reddened/irritated stop using the CHG.  Do not shave (including legs and underarms) for at least 48 hours prior to first CHG shower. It is OK to shave your face.  Please follow these instructions carefully.   1. Shower the NIGHT BEFORE SURGERY and the MORNING OF SURGERY with CHG Soap.   2. If you chose to wash your hair, wash your hair first as usual with your normal shampoo.  3. After you shampoo, rinse your hair and body thoroughly to remove the shampoo.  4. Use CHG as you would any other liquid soap. You can apply CHG directly to the skin and wash gently with a scrungie or a clean washcloth.   5. Apply the CHG Soap to your body ONLY FROM THE NECK DOWN.  Do not use on open wounds or open sores. Avoid contact with your eyes, ears, mouth and genitals (private parts). Wash Face and genitals (private parts)  with your normal soap.   6. Wash thoroughly, paying special attention to the area where your surgery will be performed.  7. Thoroughly rinse your body with warm water from the neck down.  8. DO NOT shower/wash with your normal soap after using and rinsing off the CHG Soap.  9. Pat yourself dry with a CLEAN TOWEL.  10. Wear CLEAN PAJAMAS to bed the night before surgery  11. Place CLEAN SHEETS on your bed the night of your first shower and DO NOT SLEEP WITH PETS.   Day of Surgery: Wear Clean/Comfortable clothing the morning of surgery Do not apply  any deodorants/lotions.   Remember to brush your teeth WITH YOUR REGULAR TOOTHPASTE.   Please read over the following fact sheets that you were given.

## 2020-05-17 ENCOUNTER — Encounter (HOSPITAL_COMMUNITY): Payer: Self-pay

## 2020-05-17 ENCOUNTER — Other Ambulatory Visit (HOSPITAL_COMMUNITY)
Admission: RE | Admit: 2020-05-17 | Discharge: 2020-05-17 | Disposition: A | Discharge status: Home / Self Care | Payer: Medicare Other | Source: Ambulatory Visit | Attending: Orthopaedic Surgery | Admitting: Orthopaedic Surgery

## 2020-05-17 ENCOUNTER — Encounter (HOSPITAL_COMMUNITY)
Admission: RE | Admit: 2020-05-17 | Discharge: 2020-05-17 | Disposition: A | Discharge status: Home / Self Care | Payer: Medicare Other | Source: Ambulatory Visit | Attending: Orthopaedic Surgery | Admitting: Orthopaedic Surgery

## 2020-05-17 ENCOUNTER — Other Ambulatory Visit: Payer: Self-pay

## 2020-05-17 DIAGNOSIS — Z01812 Encounter for preprocedural laboratory examination: Secondary | ICD-10-CM | POA: Insufficient documentation

## 2020-05-17 DIAGNOSIS — Z20822 Contact with and (suspected) exposure to covid-19: Secondary | ICD-10-CM | POA: Insufficient documentation

## 2020-05-17 LAB — CBC
HCT: 38.4 % (ref 36.0–46.0)
Hemoglobin: 12.3 g/dL (ref 12.0–15.0)
MCH: 32.5 pg (ref 26.0–34.0)
MCHC: 32 g/dL (ref 30.0–36.0)
MCV: 101.3 fL — ABNORMAL HIGH (ref 80.0–100.0)
Platelets: 270 10*3/uL (ref 150–400)
RBC: 3.79 MIL/uL — ABNORMAL LOW (ref 3.87–5.11)
RDW: 11.9 % (ref 11.5–15.5)
WBC: 5.3 10*3/uL (ref 4.0–10.5)
nRBC: 0 % (ref 0.0–0.2)

## 2020-05-17 LAB — SARS CORONAVIRUS 2 (TAT 6-24 HRS): SARS Coronavirus 2: NEGATIVE

## 2020-05-17 LAB — SURGICAL PCR SCREEN
MRSA, PCR: NEGATIVE
Staphylococcus aureus: NEGATIVE

## 2020-05-17 NOTE — Progress Notes (Signed)
PCP - Dr. Tracey Harries Cardiologist - Denies  Chest x-ray - 07/04/17 EKG - 03/01/20 Stress Test - Denies ECHO - Denies Cardiac Cath - Denies  Sleep Study - Has OSA CPAP - Nightly  DM - Denies  ERAS Protcol -Yes PRE-SURGERY Ensure   COVID TEST- 05/17/20  Anesthesia review: No  Patient denies shortness of breath, fever, cough and chest pain at PAT appointment   All instructions explained to the patient, with a verbal understanding of the material. Patient agrees to go over the instructions while at home for a better understanding. Patient also instructed to self quarantine after being tested for COVID-19. The opportunity to ask questions was provided.

## 2020-05-19 ENCOUNTER — Encounter (HOSPITAL_COMMUNITY): Payer: Self-pay | Admitting: Orthopaedic Surgery

## 2020-05-19 NOTE — Anesthesia Preprocedure Evaluation (Addendum)
Anesthesia Evaluation  Patient identified by MRN, date of birth, ID band Patient awake    Reviewed: Allergy & Precautions, NPO status , Patient's Chart, lab work & pertinent test results  Airway Mallampati: II  TM Distance: >3 FB Neck ROM: Limited   Comment: Decreased neck range of motion Dental no notable dental hx.    Pulmonary sleep apnea (bipap) , former smoker,    Pulmonary exam normal breath sounds clear to auscultation       Cardiovascular Exercise Tolerance: Good Normal cardiovascular exam Rhythm:Regular Rate:Normal     Neuro/Psych PSYCHIATRIC DISORDERS Anxiety Depression Hearing impairment    GI/Hepatic negative GI ROS, Neg liver ROS,   Endo/Other  obesity  Renal/GU negative Renal ROS  negative genitourinary   Musculoskeletal  (+) Arthritis ,   Abdominal   Peds negative pediatric ROS (+)  Hematology negative hematology ROS (+)   Anesthesia Other Findings   Reproductive/Obstetrics negative OB ROS                            Anesthesia Physical Anesthesia Plan  ASA: III  Anesthesia Plan: Regional and General   Post-op Pain Management: GA combined w/ Regional for post-op pain   Induction: Intravenous  PONV Risk Score and Plan: 3 and Ondansetron, Midazolam and Dexamethasone  Airway Management Planned: Oral ETT  Additional Equipment:   Intra-op Plan:   Post-operative Plan: Extubation in OR  Informed Consent: I have reviewed the patients History and Physical, chart, labs and discussed the procedure including the risks, benefits and alternatives for the proposed anesthesia with the patient or authorized representative who has indicated his/her understanding and acceptance.     Dental advisory given  Plan Discussed with: CRNA and Anesthesiologist  Anesthesia Plan Comments: (Interscalene block with Exparel + GETA. )       Anesthesia Quick Evaluation

## 2020-05-20 ENCOUNTER — Encounter (HOSPITAL_COMMUNITY)
Admission: RE | Disposition: A | Discharge status: Home / Self Care | Payer: Self-pay | Source: Home / Self Care | Attending: Orthopaedic Surgery

## 2020-05-20 ENCOUNTER — Ambulatory Visit (HOSPITAL_COMMUNITY): Payer: Medicare Other | Admitting: Anesthesiology

## 2020-05-20 ENCOUNTER — Other Ambulatory Visit: Payer: Self-pay

## 2020-05-20 ENCOUNTER — Observation Stay (HOSPITAL_COMMUNITY): Payer: Medicare Other

## 2020-05-20 ENCOUNTER — Observation Stay (HOSPITAL_COMMUNITY)
Admission: RE | Admit: 2020-05-20 | Discharge: 2020-05-21 | Disposition: A | Discharge status: Home / Self Care | Payer: Medicare Other | Attending: Orthopaedic Surgery | Admitting: Orthopaedic Surgery

## 2020-05-20 ENCOUNTER — Encounter (HOSPITAL_COMMUNITY): Payer: Self-pay | Admitting: Orthopaedic Surgery

## 2020-05-20 DIAGNOSIS — M75102 Unspecified rotator cuff tear or rupture of left shoulder, not specified as traumatic: Secondary | ICD-10-CM | POA: Diagnosis not present

## 2020-05-20 DIAGNOSIS — M12812 Other specific arthropathies, not elsewhere classified, left shoulder: Secondary | ICD-10-CM | POA: Diagnosis not present

## 2020-05-20 DIAGNOSIS — Z96643 Presence of artificial hip joint, bilateral: Secondary | ICD-10-CM | POA: Insufficient documentation

## 2020-05-20 DIAGNOSIS — Z9989 Dependence on other enabling machines and devices: Secondary | ICD-10-CM

## 2020-05-20 DIAGNOSIS — E669 Obesity, unspecified: Secondary | ICD-10-CM | POA: Diagnosis present

## 2020-05-20 DIAGNOSIS — Z96619 Presence of unspecified artificial shoulder joint: Secondary | ICD-10-CM

## 2020-05-20 DIAGNOSIS — Z7982 Long term (current) use of aspirin: Secondary | ICD-10-CM | POA: Insufficient documentation

## 2020-05-20 DIAGNOSIS — Z87891 Personal history of nicotine dependence: Secondary | ICD-10-CM | POA: Insufficient documentation

## 2020-05-20 DIAGNOSIS — F419 Anxiety disorder, unspecified: Secondary | ICD-10-CM | POA: Diagnosis present

## 2020-05-20 DIAGNOSIS — G4733 Obstructive sleep apnea (adult) (pediatric): Secondary | ICD-10-CM

## 2020-05-20 DIAGNOSIS — Z96653 Presence of artificial knee joint, bilateral: Secondary | ICD-10-CM | POA: Diagnosis not present

## 2020-05-20 DIAGNOSIS — R058 Other specified cough: Secondary | ICD-10-CM | POA: Diagnosis present

## 2020-05-20 HISTORY — PX: REVERSE SHOULDER ARTHROPLASTY: SHX5054

## 2020-05-20 SURGERY — ARTHROPLASTY, SHOULDER, TOTAL, REVERSE
Anesthesia: Regional | Site: Shoulder | Laterality: Left

## 2020-05-20 MED ORDER — PROPOFOL 10 MG/ML IV BOLUS
INTRAVENOUS | Status: DC | PRN
  Administered 2020-05-20: 150 mg via INTRAVENOUS

## 2020-05-20 MED ORDER — METOCLOPRAMIDE HCL 5 MG PO TABS: 5.0000 mg | ORAL_TABLET | Freq: Three times a day (TID) | ORAL | Status: DC | PRN

## 2020-05-20 MED ORDER — ONDANSETRON HCL 4 MG/2ML IJ SOLN
INTRAMUSCULAR | Status: AC
  Filled 2020-05-20: qty 2

## 2020-05-20 MED ORDER — METHOCARBAMOL 1000 MG/10ML IJ SOLN
500.0000 mg | Freq: Four times a day (QID) | INTRAVENOUS | Status: DC | PRN
  Filled 2020-05-20: qty 5

## 2020-05-20 MED ORDER — BUPIVACAINE LIPOSOME 1.3 % IJ SUSP
INTRAMUSCULAR | Status: DC | PRN
  Administered 2020-05-20: 10 mL

## 2020-05-20 MED ORDER — DEXAMETHASONE SODIUM PHOSPHATE 10 MG/ML IJ SOLN
INTRAMUSCULAR | Status: AC
  Filled 2020-05-20: qty 1

## 2020-05-20 MED ORDER — CEFAZOLIN SODIUM-DEXTROSE 2-4 GM/100ML-% IV SOLN
2.0000 g | Freq: Four times a day (QID) | INTRAVENOUS | Status: AC
  Administered 2020-05-20 – 2020-05-21 (×3): 2 g via INTRAVENOUS
  Filled 2020-05-20 (×3): qty 100

## 2020-05-20 MED ORDER — CEFAZOLIN SODIUM-DEXTROSE 2-4 GM/100ML-% IV SOLN
2.0000 g | INTRAVENOUS | Status: AC
  Administered 2020-05-20: 2 g via INTRAVENOUS
  Filled 2020-05-20: qty 100

## 2020-05-20 MED ORDER — TRANEXAMIC ACID 1000 MG/10ML IV SOLN
2000.0000 mg | INTRAVENOUS | Status: DC
  Filled 2020-05-20: qty 20

## 2020-05-20 MED ORDER — ALBUTEROL SULFATE (2.5 MG/3ML) 0.083% IN NEBU: 2.5000 mg | INHALATION_SOLUTION | Freq: Once | RESPIRATORY_TRACT | Status: DC

## 2020-05-20 MED ORDER — GLYCOPYRROLATE PF 0.2 MG/ML IJ SOSY
PREFILLED_SYRINGE | INTRAMUSCULAR | Status: AC
  Filled 2020-05-20: qty 1

## 2020-05-20 MED ORDER — MENTHOL 3 MG MT LOZG
1.0000 | LOZENGE | OROMUCOSAL | Status: DC | PRN
  Filled 2020-05-20: qty 9

## 2020-05-20 MED ORDER — SCOPOLAMINE 1 MG/3DAYS TD PT72
1.0000 | MEDICATED_PATCH | TRANSDERMAL | Status: DC
  Filled 2020-05-20: qty 1

## 2020-05-20 MED ORDER — MIDAZOLAM HCL 2 MG/2ML IJ SOLN
INTRAMUSCULAR | Status: DC | PRN
  Administered 2020-05-20: 2 mg via INTRAVENOUS

## 2020-05-20 MED ORDER — LIDOCAINE 2% (20 MG/ML) 5 ML SYRINGE
INTRAMUSCULAR | Status: AC
  Filled 2020-05-20: qty 5

## 2020-05-20 MED ORDER — ACETAMINOPHEN 325 MG PO TABS: 325.0000 mg | ORAL_TABLET | Freq: Four times a day (QID) | ORAL | Status: DC | PRN

## 2020-05-20 MED ORDER — METOCLOPRAMIDE HCL 5 MG/ML IJ SOLN: 5.0000 mg | Freq: Three times a day (TID) | INTRAMUSCULAR | Status: DC | PRN

## 2020-05-20 MED ORDER — 0.9 % SODIUM CHLORIDE (POUR BTL) OPTIME
TOPICAL | Status: DC | PRN
  Administered 2020-05-20: 1000 mL

## 2020-05-20 MED ORDER — PHENOL 1.4 % MT LIQD: 1.0000 | OROMUCOSAL | Status: DC | PRN

## 2020-05-20 MED ORDER — SODIUM CHLORIDE 0.9 % IR SOLN
Status: DC | PRN
  Administered 2020-05-20: 3000 mL

## 2020-05-20 MED ORDER — LAMOTRIGINE 100 MG PO TABS
200.0000 mg | ORAL_TABLET | Freq: Every day | ORAL | Status: DC
  Administered 2020-05-21: 200 mg via ORAL
  Filled 2020-05-20: qty 2

## 2020-05-20 MED ORDER — ONDANSETRON HCL 4 MG PO TABS: 4.0000 mg | ORAL_TABLET | Freq: Four times a day (QID) | ORAL | Status: DC | PRN

## 2020-05-20 MED ORDER — VANCOMYCIN HCL 1000 MG IV SOLR
INTRAVENOUS | Status: DC | PRN
  Administered 2020-05-20: 2000 mg

## 2020-05-20 MED ORDER — ZOLPIDEM TARTRATE 5 MG PO TABS: 5.0000 mg | ORAL_TABLET | Freq: Every evening | ORAL | Status: DC | PRN

## 2020-05-20 MED ORDER — METHOCARBAMOL 500 MG PO TABS
500.0000 mg | ORAL_TABLET | Freq: Four times a day (QID) | ORAL | Status: DC | PRN
  Administered 2020-05-20 – 2020-05-21 (×2): 500 mg via ORAL
  Filled 2020-05-20 (×2): qty 1

## 2020-05-20 MED ORDER — HYDROCODONE-ACETAMINOPHEN 5-325 MG PO TABS
1.0000 | ORAL_TABLET | ORAL | Status: DC | PRN
  Administered 2020-05-20: 1 via ORAL
  Administered 2020-05-21: 2 via ORAL
  Filled 2020-05-20: qty 1
  Filled 2020-05-20: qty 2

## 2020-05-20 MED ORDER — LIDOCAINE HCL (CARDIAC) PF 100 MG/5ML IV SOSY
PREFILLED_SYRINGE | INTRAVENOUS | Status: DC | PRN
  Administered 2020-05-20: 100 mg via INTRAVENOUS

## 2020-05-20 MED ORDER — CHLORHEXIDINE GLUCONATE 0.12 % MT SOLN
OROMUCOSAL | Status: AC
  Administered 2020-05-20: 15 mL via OROMUCOSAL
  Filled 2020-05-20: qty 15

## 2020-05-20 MED ORDER — MORPHINE SULFATE (PF) 2 MG/ML IV SOLN
0.5000 mg | INTRAVENOUS | Status: DC | PRN
  Administered 2020-05-21: 1 mg via INTRAVENOUS
  Filled 2020-05-20: qty 1

## 2020-05-20 MED ORDER — PHENOL 1.4 % MT LIQD
1.0000 | OROMUCOSAL | Status: DC | PRN
  Administered 2020-05-20: 1 via OROMUCOSAL
  Filled 2020-05-20: qty 177

## 2020-05-20 MED ORDER — ALBUTEROL SULFATE (2.5 MG/3ML) 0.083% IN NEBU: 2.5000 mg | INHALATION_SOLUTION | Freq: Four times a day (QID) | RESPIRATORY_TRACT | Status: DC | PRN

## 2020-05-20 MED ORDER — LACTATED RINGERS IV SOLN: INTRAVENOUS | Status: DC

## 2020-05-20 MED ORDER — TRANEXAMIC ACID-NACL 1000-0.7 MG/100ML-% IV SOLN
1000.0000 mg | INTRAVENOUS | Status: AC
  Administered 2020-05-20: 1000 mg via INTRAVENOUS
  Filled 2020-05-20: qty 100

## 2020-05-20 MED ORDER — HYDROCODONE-ACETAMINOPHEN 7.5-325 MG PO TABS
1.0000 | ORAL_TABLET | ORAL | Status: DC | PRN
  Administered 2020-05-21: 2 via ORAL
  Administered 2020-05-21: 1 via ORAL
  Filled 2020-05-20: qty 1
  Filled 2020-05-20: qty 2

## 2020-05-20 MED ORDER — MIDAZOLAM HCL 2 MG/2ML IJ SOLN
INTRAMUSCULAR | Status: AC
  Filled 2020-05-20: qty 2

## 2020-05-20 MED ORDER — LATANOPROST 0.005 % OP SOLN
1.0000 [drp] | Freq: Every day | OPHTHALMIC | Status: DC
  Administered 2020-05-20: 1 [drp] via OPHTHALMIC
  Filled 2020-05-20: qty 2.5

## 2020-05-20 MED ORDER — SUGAMMADEX SODIUM 200 MG/2ML IV SOLN
INTRAVENOUS | Status: DC | PRN
  Administered 2020-05-20: 200 mg via INTRAVENOUS

## 2020-05-20 MED ORDER — ONDANSETRON HCL 4 MG/2ML IJ SOLN: 4.0000 mg | Freq: Four times a day (QID) | INTRAMUSCULAR | Status: DC | PRN

## 2020-05-20 MED ORDER — BUPROPION HCL ER (XL) 150 MG PO TB24
150.0000 mg | ORAL_TABLET | Freq: Every morning | ORAL | Status: DC
  Administered 2020-05-21: 150 mg via ORAL
  Filled 2020-05-20: qty 1

## 2020-05-20 MED ORDER — CHLORHEXIDINE GLUCONATE 0.12 % MT SOLN: 15.0000 mL | Freq: Once | OROMUCOSAL | Status: AC

## 2020-05-20 MED ORDER — ORAL CARE MOUTH RINSE: 15.0000 mL | Freq: Once | OROMUCOSAL | Status: AC

## 2020-05-20 MED ORDER — VANCOMYCIN HCL 1000 MG IV SOLR
INTRAVENOUS | Status: AC
  Filled 2020-05-20: qty 1000

## 2020-05-20 MED ORDER — TRANEXAMIC ACID 1000 MG/10ML IV SOLN
INTRAVENOUS | Status: DC | PRN
  Administered 2020-05-20: 2000 mg via TOPICAL

## 2020-05-20 MED ORDER — SUFENTANIL CITRATE 50 MCG/ML IV SOLN
INTRAVENOUS | Status: DC | PRN
Start: 2020-05-20 — End: 2020-05-20
  Administered 2020-05-20 (×2): 5 ug via INTRAVENOUS

## 2020-05-20 MED ORDER — LACTATED RINGERS IV SOLN: INTRAVENOUS | Status: DC | PRN

## 2020-05-20 MED ORDER — ROCURONIUM 10MG/ML (10ML) SYRINGE FOR MEDFUSION PUMP - OPTIME
INTRAVENOUS | Status: DC | PRN
  Administered 2020-05-20: 100 mg via INTRAVENOUS

## 2020-05-20 MED ORDER — SUFENTANIL CITRATE 50 MCG/ML IV SOLN
INTRAVENOUS | Status: AC
  Filled 2020-05-20: qty 1

## 2020-05-20 MED ORDER — GABAPENTIN 300 MG PO CAPS
600.0000 mg | ORAL_CAPSULE | Freq: Three times a day (TID) | ORAL | Status: DC
  Administered 2020-05-20: 600 mg via ORAL
  Filled 2020-05-20 (×2): qty 2

## 2020-05-20 MED ORDER — SODIUM CHLORIDE (PF) 0.9 % IJ SOLN
INTRAMUSCULAR | Status: AC
  Filled 2020-05-20: qty 10

## 2020-05-20 MED ORDER — DIPHENHYDRAMINE HCL 12.5 MG/5ML PO ELIX
12.5000 mg | ORAL_SOLUTION | ORAL | Status: DC | PRN
  Filled 2020-05-20: qty 10

## 2020-05-20 MED ORDER — DEXAMETHASONE SODIUM PHOSPHATE 10 MG/ML IJ SOLN
INTRAMUSCULAR | Status: DC | PRN
  Administered 2020-05-20: 10 mg via INTRAVENOUS

## 2020-05-20 MED ORDER — VENLAFAXINE HCL ER 150 MG PO CP24
150.0000 mg | ORAL_CAPSULE | Freq: Every day | ORAL | Status: DC
  Administered 2020-05-21: 150 mg via ORAL
  Filled 2020-05-20: qty 2
  Filled 2020-05-20 (×2): qty 1

## 2020-05-20 MED ORDER — PHENYLEPHRINE HCL-NACL 10-0.9 MG/250ML-% IV SOLN
INTRAVENOUS | Status: DC | PRN
  Administered 2020-05-20: 40 ug/min via INTRAVENOUS

## 2020-05-20 MED ORDER — ACETAMINOPHEN 10 MG/ML IV SOLN
INTRAVENOUS | Status: DC | PRN
  Administered 2020-05-20: 1000 mg via INTRAVENOUS

## 2020-05-20 MED ORDER — POLYETHYLENE GLYCOL 3350 17 G PO PACK: 17.0000 g | PACK | Freq: Every day | ORAL | Status: DC | PRN

## 2020-05-20 MED ORDER — ONDANSETRON HCL 4 MG/2ML IJ SOLN
INTRAMUSCULAR | Status: DC | PRN
  Administered 2020-05-20: 4 mg via INTRAVENOUS

## 2020-05-20 MED ORDER — TIMOLOL MALEATE 0.5 % OP SOLN
1.0000 [drp] | Freq: Every day | OPHTHALMIC | Status: DC
  Filled 2020-05-20: qty 5

## 2020-05-20 MED ORDER — DOCUSATE SODIUM 100 MG PO CAPS
100.0000 mg | ORAL_CAPSULE | Freq: Two times a day (BID) | ORAL | Status: DC
  Administered 2020-05-20 – 2020-05-21 (×3): 100 mg via ORAL
  Filled 2020-05-20 (×3): qty 1

## 2020-05-20 MED ORDER — PROPOFOL 10 MG/ML IV BOLUS
INTRAVENOUS | Status: AC
  Filled 2020-05-20: qty 20

## 2020-05-20 MED ORDER — BUPIVACAINE HCL (PF) 0.5 % IJ SOLN
INTRAMUSCULAR | Status: DC | PRN
  Administered 2020-05-20: 20 mL

## 2020-05-20 MED ORDER — ROCURONIUM BROMIDE 10 MG/ML (PF) SYRINGE
PREFILLED_SYRINGE | INTRAVENOUS | Status: AC
  Filled 2020-05-20: qty 10

## 2020-05-20 SURGICAL SUPPLY — 75 items
AID PSTN UNV HD RSTRNT DISP (MISCELLANEOUS) ×1
APL PRP STRL LF DISP 70% ISPRP (MISCELLANEOUS) ×2
APL SKNCLS STERI-STRIP NONHPOA (GAUZE/BANDAGES/DRESSINGS) ×1
BASEPLATE P2 COATD GLND 6.5X30 (Shoulder) IMPLANT
BENZOIN TINCTURE PRP APPL 2/3 (GAUZE/BANDAGES/DRESSINGS) ×2 IMPLANT
BIT DRILL 2.5 DIA 127 CALI (BIT) ×2 IMPLANT
BIT DRILL 4 DIA CALIBRATED (BIT) ×2 IMPLANT
BIT DRILL 5/64X5 DISP (BIT) ×3 IMPLANT
BLADE SAW SAG 73X25 THK (BLADE) ×2
BLADE SAW SGTL 73X25 THK (BLADE) ×1 IMPLANT
BLADE SURG 15 STRL LF DISP TIS (BLADE) ×1 IMPLANT
BLADE SURG 15 STRL SS (BLADE) ×3
BSPLAT GLND 30 STRL LF SHLDR (Shoulder) ×1 IMPLANT
CHLORAPREP W/TINT 26 (MISCELLANEOUS) ×6 IMPLANT
CLOSURE WOUND 1/2 X4 (GAUZE/BANDAGES/DRESSINGS) ×1
COVER SURGICAL LIGHT HANDLE (MISCELLANEOUS) ×3 IMPLANT
COVER WAND RF STERILE (DRAPES) ×3 IMPLANT
DRAPE INCISE IOBAN 66X45 STRL (DRAPES) ×3 IMPLANT
DRAPE ORTHO SPLIT 77X108 STRL (DRAPES) ×6
DRAPE SURG 17X23 STRL (DRAPES) ×3 IMPLANT
DRAPE SURG ORHT 6 SPLT 77X108 (DRAPES) ×2 IMPLANT
DRAPE U-SHAPE 47X51 STRL (DRAPES) ×6 IMPLANT
DRSG AQUACEL AG ADV 3.5X10 (GAUZE/BANDAGES/DRESSINGS) ×3 IMPLANT
ELECT BLADE 4.0 EZ CLEAN MEGAD (MISCELLANEOUS)
ELECT REM PT RETURN 9FT ADLT (ELECTROSURGICAL) ×3
ELECTRODE BLDE 4.0 EZ CLN MEGD (MISCELLANEOUS) IMPLANT
ELECTRODE REM PT RTRN 9FT ADLT (ELECTROSURGICAL) ×1 IMPLANT
GAUZE SPONGE 4X4 12PLY STRL LF (GAUZE/BANDAGES/DRESSINGS) IMPLANT
GLOVE BIOGEL PI IND STRL 8 (GLOVE) ×1 IMPLANT
GLOVE BIOGEL PI INDICATOR 8 (GLOVE) ×2
GLOVE SURG SYN 7.5  E (GLOVE) ×3
GLOVE SURG SYN 7.5 E (GLOVE) ×1 IMPLANT
GLOVE SURG SYN 7.5 PF PI (GLOVE) ×1 IMPLANT
GOWN STRL REUS W/ TWL LRG LVL3 (GOWN DISPOSABLE) ×2 IMPLANT
GOWN STRL REUS W/ TWL XL LVL3 (GOWN DISPOSABLE) ×1 IMPLANT
GOWN STRL REUS W/TWL LRG LVL3 (GOWN DISPOSABLE) ×6
GOWN STRL REUS W/TWL XL LVL3 (GOWN DISPOSABLE) ×3
HANDPIECE INTERPULSE COAX TIP (DISPOSABLE) ×3
HEMOSTAT SURGICEL 2X14 (HEMOSTASIS) IMPLANT
INSERT SMALL SOCKET 32MM NEU (Insert) ×2 IMPLANT
KIT BASIN OR (CUSTOM PROCEDURE TRAY) ×3 IMPLANT
KIT TURNOVER KIT B (KITS) ×3 IMPLANT
MANIFOLD NEPTUNE II (INSTRUMENTS) ×3 IMPLANT
P2 COATDE GLNOID BSEPLT 6.5X30 (Shoulder) ×3 IMPLANT
PACK SHOULDER (CUSTOM PROCEDURE TRAY) ×3 IMPLANT
PAD ARMBOARD 7.5X6 YLW CONV (MISCELLANEOUS) ×6 IMPLANT
RESTRAINT HEAD UNIVERSAL NS (MISCELLANEOUS) ×3 IMPLANT
RETRIEVER SUT HEWSON (MISCELLANEOUS) IMPLANT
SCREW BONE LOCKING RSP 5.0X14 (Screw) ×6 IMPLANT
SCREW BONE RSP LOCK 5X14 (Screw) IMPLANT
SCREW BONE RSP LOCK 5X18 (Screw) IMPLANT
SCREW BONE RSP LOCKING 18MM LG (Screw) ×6 IMPLANT
SCREW RETAIN W/HEAD 32MM (Shoulder) ×2 IMPLANT
SEALER BIPOLAR AQUA 6.0 (INSTRUMENTS) IMPLANT
SET HNDPC FAN SPRY TIP SCT (DISPOSABLE) ×1 IMPLANT
SLING ARM IMMOBILIZER LRG (SOFTGOODS) ×3 IMPLANT
SLING ARM IMMOBILIZER MED (SOFTGOODS) IMPLANT
SMARTMIX MINI TOWER (MISCELLANEOUS)
SPONGE LAP 18X18 RF (DISPOSABLE) ×3 IMPLANT
SPONGE LAP 4X18 RFD (DISPOSABLE) IMPLANT
STEM HUMERAL 8X48 SHOULDER (Miscellaneous) ×2 IMPLANT
STRIP CLOSURE SKIN 1/2X4 (GAUZE/BANDAGES/DRESSINGS) ×2 IMPLANT
SUCTION FRAZIER HANDLE 10FR (MISCELLANEOUS) ×3
SUCTION TUBE FRAZIER 10FR DISP (MISCELLANEOUS) ×1 IMPLANT
SUPPORT WRAP ARM LG (MISCELLANEOUS) ×3 IMPLANT
SUT FIBERWIRE #2 38 T-5 BLUE (SUTURE) ×9
SUT MNCRL AB 3-0 PS2 18 (SUTURE) ×3 IMPLANT
SUT VIC AB 0 CT1 27 (SUTURE) ×3
SUT VIC AB 0 CT1 27XBRD ANBCTR (SUTURE) ×1 IMPLANT
SUT VIC AB 2-0 CT1 27 (SUTURE) ×3
SUT VIC AB 2-0 CT1 TAPERPNT 27 (SUTURE) ×1 IMPLANT
SUTURE FIBERWR #2 38 T-5 BLUE (SUTURE) ×2 IMPLANT
TAPE FIBER 2MM 7IN #2 BLUE (SUTURE) IMPLANT
TOWEL GREEN STERILE (TOWEL DISPOSABLE) ×3 IMPLANT
TOWER SMARTMIX MINI (MISCELLANEOUS) IMPLANT

## 2020-05-20 NOTE — Consult Note (Signed)
Medical Consultation   [name] [name]  IHK:[id number]  DOB: 09-15-1955  DOA: 05/20/2020  PCP: Tracey Harries, MD   Outpatient Specialists: Ramos/Brooks/Creighton - orthopedics; [facility] - psychiatry   Requesting physician: Roney Mans, orthopedics  Reason for consultation: Requesting consult - had shoulder replacement.  Has OSA on BIPAP, likely home tomorrow.  History of Present Illness: [name] [name] is an 64 y.o. female with PMH of OSA on CPAP; mild dementia; depression; HLD; and congenital hearing loss presenting for reverse shoulder arthroplasty.  She is having manageable pain post-operatively.  She is having wracking paroxysmal coughing and reports sore throat which she thinks is caused by the coughing spasms rather than the ET tube.    Review of Systems:  ROS As per HPI otherwise 10 point review of systems negative.    Past Medical History: Past Medical History:  Diagnosis Date  . Anxiety   . Deafness    Wears Bilat hearing aids since Childhood  . Environmental and seasonal allergies   . Glaucoma, both eyes   . Hyperlipidemia   . Major depressive disorder   . Mild cognitive impairment, so stated 07/16/2013   Patient subjectively complains of word finding and memory deficits. MOCA 30-30 on 07-16-13 , paraphrasic errors- uses words such as  closet for garage   . OA (osteoarthritis)    "all over"  . Obstructive sleep apnea treated with BiPAP   . Right arm weakness    upper due to shoulder issues  . Wears glasses   . Wears hearing aid in both ears     Past Surgical History: Past Surgical History:  Procedure Laterality Date  . ABDOMINAL HYSTERECTOMY    . FRACTURE SURGERY Right    Foot  . JOINT REPLACEMENT Bilateral    Knees  . JOINT REPLACEMENT Bilateral 2020   Hips  . KNEE ARTHROSCOPY W/ SYNOVECTOMY Left 07-20-2010  dr Lequita Halt  [facility]  . LAPAROSCOPIC VAGINAL HYSTERECTOMY WITH SALPINGO OOPHORECTOMY Bilateral 09-25-2005   dr Renaldo Fiddler  [facility]  . RIGHT  ANKLE TENDON REPAIR  12-21-2008   dr Simonne Come  [facility]  . TOTAL HIP ARTHROPLASTY Left 09/24/2017   Procedure: LEFT TOTAL HIP ARTHROPLASTY ANTERIOR APPROACH;  Surgeon: Ollen Gross, MD;  Location: [facility];  Service: Orthopedics;  Laterality: Left;  . TOTAL HIP ARTHROPLASTY Right 12/19/2017   Procedure: RIGHT TOTAL HIP ARTHROPLASTY ANTERIOR APPROACH;  Surgeon: Ollen Gross, MD;  Location: [facility];  Service: Orthopedics;  Laterality: Right;  . TOTAL KNEE ARTHROPLASTY Left 11/21/2013   Procedure: LEFT TOTAL KNEE  ARTHROPLASTY REVISION;  Surgeon: Loanne Drilling, MD;  Location: [facility];  Service: Orthopedics;  Laterality: Left;  . TOTAL KNEE ARTHROPLASTY Bilateral 06-11-2006   dr Lequita Halt  [facility]     Allergies:   Allergies  Allergen Reactions  . Aleve [Naproxen]     bruising  . Gramineae Pollens   . Statins Other (See Comments)    Causing liver damage      Social History:  reports that she quit smoking about 31 years ago. Her smoking use included cigarettes. She quit after 12.00 years of use. She has never used smokeless tobacco. She reports current alcohol use. She reports that she does not use drugs.   Family History: Family History  Problem Relation Age of Onset  . Depression Father   . Depression Paternal Grandmother   . Depression Mother   . Alcohol abuse Mother   . Depression  Brother   . Alcohol abuse Brother       Physical Exam: Vitals:   05/20/20 1230 05/20/20 1235 05/20/20 1240 05/20/20 1315  BP:    (!) 124/54  Pulse: 82 99 87 79  Resp: (!) 22 19 20 18   Temp:    99.3 F (37.4 C)  TempSrc:    Oral  SpO2: 94% 94% 93% 96%  Weight:      Height:        Constitutional: Alert and awake, oriented x3, not in any acute distress other than with coughing paroxysms Eyes: EOMI, irises appear normal, anicteric sclera,  ENMT: external ears and nose appear normal, hearing improved once hearing aids were replaced, Lips appear normal, oropharynx mucosa appears normal  Neck: neck  appears normal, no masses, normal ROM, no thyromegaly, no JVD  CVS: S1-S2 clear, no murmur rubs or gallops, no LE edema, normal pedal pulses  Respiratory:  clear to auscultation bilaterally with mild wheezing and upper airway noise. Respiratory effort normal. No accessory muscle use.  Abdomen: soft nontender, nondistended, normal bowel sounds, no hepatosplenomegaly, no hernias  Musculoskeletal: : L shoulder with bandage C/D/I and sling in place Neuro: Cranial nerves II-XII intact, strength Psych: judgement and insight appear normal, stable mood and affect, mental status Skin: no rashes or lesions or ulcers, no induration or nodules    Data reviewed:  I have personally reviewed the recent labs and imaging studies  Pertinent Labs:   Unremarkable CBC on 10/11 Negative MRSA PCR on 10/11 COVID negative on 10/11   Inpatient Medications:   Scheduled Meds: . albuterol  2.5 mg Nebulization Once  . [START ON 05/21/2020] buPROPion  150 mg Oral q morning - 10a  . docusate sodium  100 mg Oral BID  . gabapentin  600 mg Oral TID  . [START ON 05/21/2020] lamoTRIgine  200 mg Oral Daily  . latanoprost  1 drop Both Eyes QHS  . scopolamine  1 patch Transdermal Q72H  . [START ON 05/21/2020] timolol  1 drop Both Eyes Daily  . [START ON 05/21/2020] venlafaxine XR  150 mg Oral Q breakfast   Continuous Infusions: .  ceFAZolin (ANCEF) IV    . methocarbamol (ROBAXIN) IV       Radiological Exams on Admission: DG Shoulder Left Port  Result Date: 05/20/2020 CLINICAL DATA:  Status post reversed total shoulder arthroplasty. EXAM: LEFT SHOULDER COMPARISON:  None. FINDINGS: Single frontal view of the left shoulder shows the patient to be status post reverse shoulder replacement. Gas in the joint space is compatible with the immediate postoperative state. No evidence for immediate hardware complication. Low lung volumes with left base atelectasis. IMPRESSION: Status post reverse left shoulder replacement. No  evidence for immediate complicating features. Electronically Signed   By: 05/22/2020 M.D.   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 Active Problems:   Anxiety and depression   Obesity (BMI 30-39.9)   OSA on CPAP   Paroxysmal cough   L shoulder arthroplasty -Patient with L shoulder rotator cuff tear, s/p arthroplasty -She is to be observed overnight and will start PT/OT -Orthopedics is managing this issue  Paroxysmal cough -Patient with spasmodic cough post-procedure -Mild wheezing on exam -Will give one-time Albuterol neb -Will also order prn Chloraseptic and a scopolamine patch -Anticipate improvement with time  Anxiety/depression -Continue Wellbutrin, Lamictal, Effexor  OSA -Continue CPAP  HLD -Hold fish oil due to limited inpatient utility  Glaucoma -  Continue Xalatan, Timoptic  Obesity -BMI 35.15; she has lost 5.3 kg since February -Ongoing weight loss should be encouraged -Outpatient PCP/bariatric medicine f/u encouraged    Thank you for this consultation.  Our [facility] hospitalist team will follow the patient with you.   Time Spent: 50 minutes  Jonah Blue M.D. [facility] 05/20/2020, 1:15 PM

## 2020-05-20 NOTE — Anesthesia Procedure Notes (Signed)
Anesthesia Regional Block: Interscalene brachial plexus block   Pre-Anesthetic Checklist: ,, timeout performed, Correct Patient, Correct Site, Correct Laterality, Correct Procedure, Correct Position, site marked, Risks and benefits discussed,  Surgical consent,  Pre-op evaluation,  At surgeon's request and post-op pain management  Laterality: Left  Prep: chloraprep       Needles:  Injection technique: Single-shot  Needle Type: Echogenic Stimulator Needle     Needle Length: 9cm  Needle Gauge: 21     Additional Needles:   Procedures:,,,, ultrasound used (permanent image in chart),,,,  Narrative:  Start time: 05/20/2020 7:00 AM End time: 05/20/2020 7:10 AM Injection made incrementally with aspirations every 5 mL.  Performed by: Personally  Anesthesiologist: Mellody Dance, MD  Additional Notes: Functioning IV was confirmed and monitors applied.  Sterile prep and drape,hand hygiene and sterile gloves were used.Ultrasound guidance: relevant anatomy identified, needle position confirmed, local anesthetic spread visualized around nerve(s)., vascular puncture avoided.  Image printed for medical record.  Negative aspiration and negative test dose prior to incremental administration of local anesthetic. The patient tolerated the procedure well.

## 2020-05-20 NOTE — Transfer of Care (Signed)
Immediate Anesthesia Transfer of Care Note  Patient: [name] [name]  Procedure(s) Performed: REVERSE SHOULDER ARTHROPLASTY (Left Shoulder)  Patient Location: PACU  Anesthesia Type:GA combined with regional for post-op pain  Level of Consciousness: awake, alert , oriented and patient cooperative  Airway & Oxygen Therapy: Patient Spontanous Breathing and Patient connected to nasal cannula oxygen  Post-op Assessment: Report given to RN, Post -op Vital signs reviewed and stable and Patient moving all extremities X 4  Post vital signs: Reviewed and stable  Last Vitals:  Vitals Value Taken Time  BP 104/75 05/20/20 1011  Temp    Pulse 72 05/20/20 1014  Resp 17 05/20/20 1014  SpO2 95 % 05/20/20 1014  Vitals shown include unvalidated device data.  Last Pain:  Vitals:   05/20/20 0631  TempSrc:   PainSc: 10-Worst pain ever      Patients Stated Pain Goal: 5 (05/20/20 0631)  Complications: No complications documented.

## 2020-05-20 NOTE — Anesthesia Procedure Notes (Signed)

## 2020-05-20 NOTE — Progress Notes (Signed)
Pt refused cpap for the night.  

## 2020-05-20 NOTE — Op Note (Signed)
PREOPERATIVE DIAGNOSIS: Left shoulder rotator cuff tear arthropathy  POSTOPERATIVE DIAGNOSIS: Same  ATTENDING PHYSICIAN: Gasper Lloyd. Arthur Holms, MD who was present and scrubbed for the entire case   ASSISTANT SURGEON: Lenn Cal, RFNA  ANESTHESIA: General with Exparel regional block  SURGICAL PROCEDURES: Left shoulder reverse arthroplasty  SURGICAL INDICATIONS: Patient is a 64 year old female with a long history of left shoulder pain.  She was found to have left shoulder rotator cuff tear arthropathy.  She underwent initial conservative treatment with activity modification, anti-inflammatories as well as a corticosteroid injection.  Despite this she had continued pain which limited her ability to use left arm in a normal basis.  We discussed treatment options going forward and she did wish to proceed forward with left shoulder reverse arthroplasty.  She presents today for that.  FINDING: Full-thickness retracted tear of the supraspinatus tendon with degenerative changes on both the humeral head and glenoid.  Successful reverse shoulder arthroplasty was achieved with DJO Altivate implant.  DESCRIPTION OF PROCEDURE: Patient was identified in the preoperative holding area where the risk benefits and alternatives of the procedure were once again discussed with the patient.  These risks include but are not limited to infection, bleeding, damage to surrounding structures including blood vessels and nerves, pain, stiffness, implant failure, dislocation and need for additional procedures.  Informed consent obtained at that time the patient's left shoulder was marked with a surgical marking pen.  She then underwent a left upper extremity plexus block by anesthesia.  She was brought to the operative suite where timeout was performed identifying the correct patient operative site.  She was initially positioned supine on the operative table and induced under general anesthesia.  She was then sat upright into  the beachchair position.  Her head was positioned appropriately in a neutral position.  All bony prominences were well-padded.  The left upper extremity was then prepped and draped in usual sterile fashion.  Standard deltopectoral interval was utilized with a longitudinal incision made over the anterior aspect of the shoulder.  Electrocautery was used to dissect down through the subcutaneous tissues.  The cephalic vein was identified in the deltopectoral interval and mobilized medially.  Crossing veins were cauterized in doing so.  The clavipectoral fascia was then visualized and carefully incised.  Conjoined tendon was then bluntly retracted medially using a blunt Hohmann retractor.  There were adhesions on the undersurface of the deltoid which were released using Cobb elevator and the brown deltoid retractor was then inserted.  At this point the biceps tendon was found within the bicipital groove and tenodesed to the superior edge of the pectoralis major tendon.  The biceps tendon was then incised and retracted proximally opening the rotator interval.  The greater tuberosity was completely devoid of the supraspinatus there was thickened bursal tissue over the humeral head which was resected.  Electrocautery was used to perform a subscapularis peel off the lesser tuberosity.  The subscapularis was tagged with a #2 FiberWire.  The shoulder was then externally rotated and releases were made along the inferior aspect of the humeral head and neck.  This allowed for dislocation of the humeral head anteriorly.  The DJ O humeral cutting guide was then pinned in the place along the proximal humerus.  This was done with approximately 30 degrees of retroversion.  Once in position the humeral head was cut with a saw and the humeral head was removed.  Rondure was used to remove inferior osteophytes off the remaining humeral neck.  Retractors  were then placed and attention was turned to the glenoid.  The subscapularis  tendon was retracted in the wound and the capsule was released along the anterior glenoid.  Circumferential soft tissue releases were performed around the glenoid using electrocautery until full visualization of the bony surface was appreciated.  There was a large superior, posterior osteophyte off the glenoid which was removed using a small osteotome.  The center portion of the glenoid was then marked and the 2.5 mm drill was inserted and advanced bicortically using the drill guide from the DJ O set.  The tap was then inserted and sequential reaming was done over top of the tap.  Preferential reaming of the inferior glenoid was performed creating a stable position for the baseplate.  Small portion of bone was removed inferiorly with a rondure following the reaming.  At this point the tap was removed and the baseplate was inserted.  This had excellent purchase and fixation on the glenoid.  The wound was copiously irrigated at this point with normal saline and pulse lavage.  4 drill holes were made through the baseplate which measured 18 mm both superiorly and inferiorly and 14 mm anteriorly and posteriorly.  4 locking screws were then placed through the baseplate.  A 32 neutral glenosphere was then impacted onto the baseplate which had a stable fixation.  The locking screw was placed through the central portion as well.  At this point all retractors were removed and attention was turned back to the proximal humerus  The reamer was used to prep the proximal humerus followed by sequential canal reamers.  This was found to have a good fit with a size 8 reamer.  The broach was then impacted which had excellent purchase and the shoulder was reduced with a trial implants.  This had stable reduction with no evidence of impingement and good range of motion in terms of forward flexion, external rotation and internal rotation.  The shoulder was once again dislocated and trial implants were removed including the broach.  3  drill holes were placed to the proximal humerus and #2 FiberWire were passed through these drill holes for later subscapularis repair. The size 8 short stemmed DJ O humeral component was then impacted.  This had stable fixation.  A standard polyethylene was then impacted onto the humeral component as well.  The shoulder was reduced and once again had stable range of motion with no evidence of instability or impingement.  The wound was again copiously irrigated with normal saline.  The subscapularis was repaired with the previously passed three #2 FiberWire sutures in simple fashion.  Topical TXA and vancomycin powder were then placed into the wound.  The deltopectoral fascia was then closed with a running 0 Vicryl suture.  The skin was closed with deep 2-0 Vicryl sutures followed by running 3-0 Monocryl suture.  Steri-Strips and Aquacel dressing were then placed.  Patient was awoken from her anesthesia and extubated in the operating room without any complications.  Her arm was then placed into a sling with the strap around her abdomen.  She was taken to PACU in stable condition.  She tolerated the procedure well and there were no complications.  ESTIMATED BLOOD LOSS: 200 mL  TOURNIQUET TIME: None  SPECIMENS: None  POSTOPERATIVE PLAN: The patient will be admitted overnight for observation as well as PT and OT.  She can begin some gentle early range of motion exercises with the shoulder but is to remain in her sling full-time except  during her hygiene and exercises.  We will plan to see her back in clinic in 2 weeks following discharge to progress her with more therapy through the office.  Postoperative radiographs will be obtained in clinic.  IMPLANTS: DJO Altivate, small shell size 8 stem with standard polyethylene.  32 neutral glenosphere with standard baseplate.

## 2020-05-20 NOTE — H&P (Signed)
ORTHOPAEDIC H&P  PCP:  Tracey Harries, MD  Chief Complaint: Left shoulder rotator cuff tear arthropathy  HPI: [name] [name] is a 64 y.o. female who complains of left shoulder rotator cuff tear arthropathy.  She has undergone conservative treatment up to this point with activity modification, anti-inflammatories, steroid injections and despite this she has continued pain and limitations with use her left arm.  She presents today for left shoulder reverse arthroplasty.  Past Medical History:  Diagnosis Date  . Anxiety   . Deafness    Wears Bilat hearing aids since Childhood  . Environmental and seasonal allergies   . Glaucoma, both eyes   . Hyperlipidemia   . Major depressive disorder   . Mild cognitive impairment, so stated 07/16/2013   Patient subjectively complains of word finding and memory deficits. MOCA 30-30 on 07-16-13 , paraphrasic errors- uses words such as  closet for garage   . OA (osteoarthritis)    "all over"  . Obstructive sleep apnea treated with BiPAP   . Right arm weakness    upper due to shoulder issues  . Wears glasses   . Wears hearing aid in both ears    Past Surgical History:  Procedure Laterality Date  . ABDOMINAL HYSTERECTOMY    . FRACTURE SURGERY Right    Foot  . JOINT REPLACEMENT Bilateral    Knees  . JOINT REPLACEMENT Bilateral 2020   Hips  . KNEE ARTHROSCOPY W/ SYNOVECTOMY Left 07-20-2010  dr Lequita Halt  [facility]  . LAPAROSCOPIC VAGINAL HYSTERECTOMY WITH SALPINGO OOPHORECTOMY Bilateral 09-25-2005   dr Renaldo Fiddler  [facility]  . RIGHT ANKLE TENDON REPAIR  12-21-2008   dr Simonne Come  [facility]  . TOTAL HIP ARTHROPLASTY Left 09/24/2017   Procedure: LEFT TOTAL HIP ARTHROPLASTY ANTERIOR APPROACH;  Surgeon: Ollen Gross, MD;  Location: [facility];  Service: Orthopedics;  Laterality: Left;  . TOTAL HIP ARTHROPLASTY Right 12/19/2017   Procedure: RIGHT TOTAL HIP ARTHROPLASTY ANTERIOR APPROACH;  Surgeon: Ollen Gross, MD;  Location: [facility];  Service: Orthopedics;  Laterality:  Right;  . TOTAL KNEE ARTHROPLASTY Left 11/21/2013   Procedure: LEFT TOTAL KNEE  ARTHROPLASTY REVISION;  Surgeon: Loanne Drilling, MD;  Location: [facility];  Service: Orthopedics;  Laterality: Left;  . TOTAL KNEE ARTHROPLASTY Bilateral 06-11-2006   dr Lequita Halt  [facility]   Social History   Socioeconomic History  . Marital status: Legally Separated    Spouse name: Not on file  . Number of children: 0  . Years of education: Not on file  . Highest education level: Not on file  Occupational History    Employer: [employer]  Tobacco Use  . Smoking status: Former Smoker    Years: 12.00    Types: Cigarettes    Quit date: 11/18/1988    Years since quitting: 31.5  . Smokeless tobacco: Never Used  Vaping Use  . Vaping Use: Never used  Substance and Sexual Activity  . Alcohol use: Yes    Comment: occ  . Drug use: No  . Sexual activity: Yes    Partners: Female    Birth control/protection: None  Other Topics Concern  . Not on file  Social History Narrative   Works full-time @ [employer]    Has a cat,3 dogs    lives with Partner(Female)[name]   Social Determinants of Health   Financial Resource Strain:   . Difficulty of Paying Living Expenses: Not on file  Food Insecurity:   . Worried About Programme researcher, broadcasting/film/video in  the Last Year: Not on file  . Ran Out of Food in the Last Year: Not on file  Transportation Needs:   . Lack of Transportation (Medical): Not on file  . Lack of Transportation (Non-Medical): Not on file  Physical Activity:   . Days of Exercise per Week: Not on file  . Minutes of Exercise per Session: Not on file  Stress:   . Feeling of Stress : Not on file  Social Connections:   . Frequency of Communication with Friends and Family: Not on file  . Frequency of Social Gatherings with Friends and Family: Not on file  . Attends Religious Services: Not on file  . Active Member of Clubs or Organizations: Not on file  . Attends Banker Meetings: Not on file  . Marital  Status: Not on file   Family History  Problem Relation Age of Onset  . Depression Father   . Depression Paternal Grandmother   . Depression Mother   . Alcohol abuse Mother   . Depression Brother   . Alcohol abuse Brother    Allergies  Allergen Reactions  . Aleve [Naproxen]     bruising  . Gramineae Pollens   . Statins Other (See Comments)    Causing liver damage    Prior to Admission medications   Medication Sig Start Date End Date Taking? Authorizing Provider  aspirin-sod bicarb-citric acid (ALKA-SELTZER) 325 MG TBEF tablet Take 650 mg by mouth every 6 (six) hours as needed (indigestion).   Yes [provider]  buPROPion (WELLBUTRIN XL) 150 MG 24 hr tablet Take 1 tablet (150 mg total) by mouth every morning. 02/26/20  Yes Arfeen, Phillips Grout, MD  gabapentin (NEURONTIN) 300 MG capsule Take 600 mg by mouth 3 (three) times daily.    Yes [provider]  lamoTRIgine (LAMICTAL) 200 MG tablet Take 1 tablet (200 mg total) by mouth daily. 02/26/20  Yes Arfeen, Phillips Grout, MD  latanoprost (XALATAN) 0.005 % ophthalmic solution Place 1 drop into both eyes at bedtime. 04/20/20  Yes [provider]  Menthol, Topical Analgesic, (ICY HOT ADVANCED RELIEF EX) Apply 1 application topically 2 (two) times daily as needed (pain).   Yes [provider]  Omega-3 Fatty Acids (FISH OIL) 1200 MG CAPS Take 1,200 mg by mouth daily.   Yes [provider]  timolol (TIMOPTIC) 0.5 % ophthalmic solution Place 1 drop into both eyes daily.  06/21/15  Yes [provider]  venlafaxine XR (EFFEXOR-XR) 150 MG 24 hr capsule TAKE 1 CAPSULE(150 MG) BY MOUTH DAILY WITH BREAKFAST Patient taking differently: Take 150 mg by mouth daily with breakfast.  02/26/20  Yes Arfeen, Phillips Grout, MD  vitamin B-12 (CYANOCOBALAMIN) 1000 MCG tablet Take 1,000 mcg by mouth daily.   Yes [provider]  zolpidem (AMBIEN) 5 MG tablet Take 1 tablet (5 mg total) by mouth at bedtime. Patient taking  differently: Take 5 mg by mouth at bedtime as needed for sleep.  02/26/20  Yes Arfeen, Phillips Grout, MD   No results found.  Positive ROS: All other systems have been reviewed and were otherwise negative with the exception of those mentioned in the HPI and as above.  Physical Exam: Patient is a 64 year old female.  Constitutional: Healthy-appearing and normal body habitus who is in NAD. Ambulation normal.  Psychiatric: Normal affect. Oriented x3  Cardiovascular: Bilateral radial pulse 2+ and capillary refill test normal. Edema none.  Musculoskeletal: Bilateral hand no atrophy.  Neurologic: Bilateral Normal sensation to the  ulnar nerve distribution, radial nerve distribution, and median nerve distribution.  Left shoulder: Examination of the left shoulder shows a grossly normal appearing shoulder. There is no significant swelling, erythema or signs of infection. She has tenderness to palpation both anteriorly and posterior at the glenohumeral joints. There is no tenderness to the acromion or AC joint. There is crepitus with gentle passive range of motion at the greater tuberosity. Her forward flexion is limited to 120 degrees, external rotation to 10 degrees and internal rotation to the back pocket. She has 4/5 strength to the infraspinatus and supraspinatus. 5/5 strength to the subscapularis. She has a positive external rotation lag sign as well as a Hornblower sign.  Assessment: Left shoulder rotator cuff arthropathy  Plan: Proceed forward with left shoulder reverse arthroplasty.  The risk, benefits and alternatives of the procedure were once again discussed the patient.  These risks include but are not limited to infection, bleeding, damage to surrounding structures including blood vessels and nerves, pain, stiffness, implant failure, fracture, dislocation and need for additional procedures.  Informed consent was obtained the patient's left shoulder was marked.  Plan for overnight  observation here in the hospital.    Ernest Mallick, MD [phone]   05/20/2020 7:13 AM

## 2020-05-21 ENCOUNTER — Encounter (HOSPITAL_COMMUNITY): Payer: Self-pay | Admitting: Orthopaedic Surgery

## 2020-05-21 DIAGNOSIS — R058 Other specified cough: Secondary | ICD-10-CM | POA: Diagnosis not present

## 2020-05-21 DIAGNOSIS — E669 Obesity, unspecified: Secondary | ICD-10-CM

## 2020-05-21 DIAGNOSIS — G4733 Obstructive sleep apnea (adult) (pediatric): Secondary | ICD-10-CM

## 2020-05-21 DIAGNOSIS — M75102 Unspecified rotator cuff tear or rupture of left shoulder, not specified as traumatic: Secondary | ICD-10-CM | POA: Diagnosis not present

## 2020-05-21 DIAGNOSIS — Z9989 Dependence on other enabling machines and devices: Secondary | ICD-10-CM

## 2020-05-21 LAB — CBC
HCT: 34.6 % — ABNORMAL LOW (ref 36.0–46.0)
Hemoglobin: 11.1 g/dL — ABNORMAL LOW (ref 12.0–15.0)
MCH: 32.5 pg (ref 26.0–34.0)
MCHC: 32.1 g/dL (ref 30.0–36.0)
MCV: 101.2 fL — ABNORMAL HIGH (ref 80.0–100.0)
Platelets: UNDETERMINED 10*3/uL (ref 150–400)
RBC: 3.42 MIL/uL — ABNORMAL LOW (ref 3.87–5.11)
RDW: 12.2 % (ref 11.5–15.5)
WBC: 9.8 10*3/uL (ref 4.0–10.5)
nRBC: 0 % (ref 0.0–0.2)

## 2020-05-21 LAB — BASIC METABOLIC PANEL
Anion gap: 10 (ref 5–15)
BUN: 10 mg/dL (ref 8–23)
CO2: 25 mmol/L (ref 22–32)
Calcium: 8.9 mg/dL (ref 8.9–10.3)
Chloride: 103 mmol/L (ref 98–111)
Creatinine, Ser: 0.8 mg/dL (ref 0.44–1.00)
GFR, Estimated: >60 mL/min (ref >60)
Glucose, Bld: 124 mg/dL — ABNORMAL HIGH (ref 70–99)
Potassium: 3.6 mmol/L (ref 3.5–5.1)
Sodium: 138 mmol/L (ref 135–145)

## 2020-05-21 MED ORDER — HYDROCODONE-ACETAMINOPHEN 5-325 MG PO TABS: 1.0000 | ORAL_TABLET | ORAL | 0 refills | Status: DC | PRN

## 2020-05-21 MED ORDER — METHOCARBAMOL 500 MG PO TABS: 500.0000 mg | ORAL_TABLET | Freq: Four times a day (QID) | ORAL | 1 refills | Status: DC | PRN

## 2020-05-21 NOTE — Progress Notes (Signed)
Patient alert and oriented, mae's well, voiding adequate amount of urine, swallowing without difficulty, no c/o pain at time of discharge. Patient discharged home with family. Script and discharged instructions given to patient. Patient and family stated understanding of instructions given. Patient has an appointment with Dr. Creighton 

## 2020-05-21 NOTE — Evaluation (Addendum)
Physical Therapy Evaluation Patient Details Name: [name] [name] MRN: [id number] DOB: Dec 23, 1955 Today's Date: 05/21/2020   History of Present Illness  64 y.o. female who complains of left shoulder rotator cuff tear arthropathy. PMH includes anxiety, deafness, MDD, glaucoma in both eyes. Pt underwent Left shoulder reverse arthroplasty on 05/20/2020.  Clinical Impression  Pt presents to PT s/p L total reverse shoulder arthroplasty. Pt mobilizes independently at this time, reports her ambulation feels at baseline level. Pt reports she has assistance available from family PRN, and while she cares for her father she does not have to provide physical assistance. Pt requires no further acute PT services at this time. UE exercise deferred to OT. PT has no DME needs at this time, PT recommends following surgeons plan for follow-up therapies. Acute PT signing off.    Follow Up Recommendations Follow surgeon's recommendation for DC plan and follow-up therapies (likely outpatient PT when cleared)    Equipment Recommendations  None recommended by PT    Recommendations for Other Services       Precautions / Restrictions Precautions Precautions: Shoulder Type of Shoulder Precautions: total reverse shoulder Shoulder Interventions: Shoulder sling/immobilizer;Off for dressing/bathing/exercises Precaution Booklet Issued: No (defer to OT) Required Braces or Orthoses: Sling Restrictions Weight Bearing Restrictions: Yes LUE Weight Bearing: Non weight bearing      Mobility  Bed Mobility Overal bed mobility: Modified Independent             General bed mobility comments: supine to sit with HOB elevated and increased time, pt returns to supine by climbining onto bed on knees and then rotating into sitting position once safely on bed  Transfers Overall transfer level: Independent                  Ambulation/Gait Ambulation/Gait assistance: Independent Gait Distance (Feet): 200  Feet Assistive device: None Gait Pattern/deviations: WFL(Within Functional Limits) Gait velocity: functional Gait velocity interpretation: >2.62 ft/sec, indicative of community ambulatory General Gait Details: steady step through gait  Stairs Stairs: Yes Stairs assistance: Min guard Stair Management: No rails;Step to pattern Number of Stairs: 8 General stair comments: hand hold for stair negotiation  Wheelchair Mobility    Modified Rankin (Stroke Patients Only)       Balance Overall balance assessment: Independent                                           Pertinent Vitals/Pain Pain Assessment: Faces Faces Pain Scale: Hurts a little bit Pain Location: LUE Pain Descriptors / Indicators: Grimacing Pain Intervention(s): Monitored during session    Home Living Family/patient expects to be discharged to:: Private residence Living Arrangements: Parent Available Help at Discharge: Family;Available PRN/intermittently Type of Home: House Home Access: Stairs to enter Entrance Stairs-Rails: None Entrance Stairs-Number of Steps: 2 Home Layout: One level Home Equipment: Walker - 2 wheels;Cane - single point      Prior Function Level of Independence: Independent               Hand Dominance   Dominant Hand: Right    Extremity/Trunk Assessment   Upper Extremity Assessment Upper Extremity Assessment: Defer to OT evaluation    Lower Extremity Assessment Lower Extremity Assessment: Overall WFL for tasks assessed    Cervical / Trunk Assessment Cervical / Trunk Assessment: Normal  Communication   Communication: HOH  Cognition Arousal/Alertness: Awake/alert Behavior During Therapy: WFL for tasks  assessed/performed Overall Cognitive Status: Within Functional Limits for tasks assessed                                        General Comments General comments (skin integrity, edema, etc.): VSS on RA    Exercises      Assessment/Plan    PT Assessment Patent does not need any further PT services  PT Problem List         PT Treatment Interventions      PT Goals (Current goals can be found in the Care Plan section)       Frequency     Barriers to discharge        Co-evaluation               AM-PAC PT "6 Clicks" Mobility  Outcome Measure Help needed turning from your back to your side while in a flat bed without using bedrails?: None Help needed moving from lying on your back to sitting on the side of a flat bed without using bedrails?: None Help needed moving to and from a bed to a chair (including a wheelchair)?: None Help needed standing up from a chair using your arms (e.g., wheelchair or bedside chair)?: None Help needed to walk in hospital room?: None Help needed climbing 3-5 steps with a railing? : None 6 Click Score: 24    End of Session   Activity Tolerance: Patient tolerated treatment well Patient left: in bed;with call bell/phone within reach Nurse Communication: Mobility status      Time: 2035-5974 PT Time Calculation (min) (ACUTE ONLY): 17 min   Charges:   PT Evaluation $PT Eval Low Complexity: 1 Low          Arlyss Gandy, PT, DPT Acute Rehabilitation Pager: [phone]   Arlyss Gandy 05/21/2020, 8:32 AM

## 2020-05-21 NOTE — Evaluation (Signed)
Occupational Therapy Evaluation Patient Details Name: [name] [name] MRN: [id number] DOB: 1955-09-18 Today's Date: 05/21/2020    History of Present Illness 64 y.o. female who complains of left shoulder rotator cuff tear arthropathy. PMH includes anxiety, deafness, MDD, glaucoma in both eyes. Pt underwent Left shoulder reverse arthroplasty on 05/20/2020.   Clinical Impression   Patient admitted with the above diagnosis.  She presents with minimal L shoulder pain, but otherwise has expected declines to self care due to MD restrictions to LUE.  Patient able to demonstrate good follow through of all exercises and ADL techniques to maintain shoulder precautions.  HEP and Shoulder handout issued, reviewed, patient performed, all questions answered and patient verbalizes understanding.  Sling training completed.  MD ordered [facility] aide and HH PT for continued follow up for shoulder rehab.  No further acute care needs.       Follow Up Recommendations  Follow surgeon's recommendation for DC plan and follow-up therapies    Equipment Recommendations  None recommended by OT    Recommendations for Other Services       Precautions / Restrictions Precautions Precautions: Shoulder Type of Shoulder Precautions: total reverse shoulder Shoulder Interventions: Shoulder sling/immobilizer;Off for dressing/bathing/exercises Precaution Booklet Issued: Yes (comment) Required Braces or Orthoses: Sling Restrictions Weight Bearing Restrictions: Yes LUE Weight Bearing: Non weight bearing      Mobility Bed Mobility Overal bed mobility: Modified Independent             General bed mobility comments: supine to sit with HOB elevated and increased time, pt returns to supine by climbining onto bed on knees and then rotating into sitting position once safely on bed  Transfers Overall transfer level: Independent                    Balance Overall balance assessment: Independent                                          ADL either performed or assessed with clinical judgement   ADL Overall ADL's : Modified independent                                       General ADL Comments: Verbal cueing for dressing LUE first; leaning forward for washing under L arm.     Vision Baseline Vision/History: Wears glasses Wears Glasses: At all times Patient Visual Report: No change from baseline       Perception     Praxis      Pertinent Vitals/Pain Pain Assessment: Faces Faces Pain Scale: Hurts little more Pain Location: LUE Pain Descriptors / Indicators: Grimacing;Aching Pain Intervention(s): Monitored during session;Repositioned     Hand Dominance Right   Extremity/Trunk Assessment Upper Extremity Assessment Upper Extremity Assessment: LUE deficits/detail LUE Deficits / Details: Shoulder precautions: A/PROM FF to 0-90 degrees; A/PROM Abd 0-60 degrees; A/PROM ER 0-30 degrees.   Lower Extremity Assessment Lower Extremity Assessment: Defer to PT evaluation   Cervical / Trunk Assessment Cervical / Trunk Assessment: Normal   Communication Communication Communication: HOH   Cognition Arousal/Alertness: Awake/alert Behavior During Therapy: WFL for tasks assessed/performed Overall Cognitive Status: Within Functional Limits for tasks assessed  General Comments  VSS on RA               Home Living Family/patient expects to be discharged to:: Private residence Living Arrangements: Parent Available Help at Discharge: Family;Available PRN/intermittently Type of Home: House Home Access: Stairs to enter [employer] of Steps: 2 Entrance Stairs-Rails: None Home Layout: One level     Bathroom Shower/Tub: Chief Strategy Officer: Standard     Home Equipment: Environmental consultant - 2 wheels;Cane - single point          Prior Functioning/Environment Level of Independence:  Independent                 OT Problem List: Pain      OT Treatment/Interventions:      OT Goals(Current goals can be found in the care plan section) Acute Rehab OT Goals Patient Stated Goal: Return home OT Goal Formulation: With patient Time For Goal Achievement: 05/24/20 Potential to Achieve Goals: Good  OT Frequency:     Barriers to D/C:  none noted          Co-evaluation              AM-PAC OT "6 Clicks" Daily Activity     Outcome Measure Help from another person eating meals?: None   Help from another person toileting, which includes using toliet, bedpan, or urinal?: None Help from another person bathing (including washing, rinsing, drying)?: None Help from another person to put on and taking off regular upper body clothing?: A Little (assist with the sling) Help from another person to put on and taking off regular lower body clothing?: None 6 Click Score: 19   End of Session Equipment Utilized During Treatment: Other (comment) (Sling) Nurse Communication: Other (comment) (ready for discharge)  Activity Tolerance: Patient tolerated treatment well Patient left: in bed;with call bell/phone within reach  OT Visit Diagnosis: Pain Pain - Right/Left: Left Pain - part of body: Shoulder                Time: 6712-4580 OT Time Calculation (min): 50 min Charges:  OT General Charges $OT Visit: 1 Visit OT Evaluation $OT Eval Moderate Complexity: 1 Mod OT Treatments $Self Care/Home Management : 23-37 mins  05/21/2020  Rich, OTR/L  Acute Rehabilitation Services  Office:  [phone]   [name] [name] 05/21/2020, 9:43 AM

## 2020-05-21 NOTE — TOC Transition Note (Addendum)
Transition of Care [facility]) - CM/SW Discharge Note   Patient Details  Name: [name] [name] MRN: [id number] Date of Birth: 1955/11/04  Transition of Care [facility]) CM/SW Contact:  [place] Pink, Student-Social Work Phone Number: 05/21/2020, 1:49 PM   Clinical Narrative:     Spoke with patient about receiving home health for an aid and PT. Patient did not have any preferences and was agreeable to an agency that would accept her insurance. [facility] has accepted this patient for PT aid.   Final next level of care: Home w Home Health Services Barriers to Discharge: Continued Medical Work up   Patient Goals and CMS Choice Patient states their goals for this hospitalization and ongoing recovery are:: To feel better CMS Medicare.gov Compare Post Acute Care list provided to:: Patient Choice offered to / list presented to : Patient  Discharge Placement                       Discharge Plan and Services In-house Referral: Clinical Social Work Discharge Planning Services: CM Consult Post Acute Care Choice: Home Health                Time DME Agency Contacted: [phone]   HH Arranged: PT, Nurse's Aide HH Agency: [facility] Date [facility] Agency Contacted: 05/21/20 Time HH Agency Contacted: [phone] Representative spoke with at [facility] Agency: Denyse Amass  Social Determinants of Health (SDOH) Interventions     Readmission Risk Interventions No flowsheet data found.

## 2020-05-21 NOTE — Discharge Instructions (Signed)
Discharge Instructions  - Keep dressings in place for the next 5-7 days. You may shower over top of the current dressing. Once the surgical dressing has been removed, the incision may get wet as long as it is without drainage. Change dressing then daily with clean gauze and tape - Take all medication as prescribed. Transition to over the counter pain medication as your pain improves - Keep the shoulder elevated over the next 48-72 hours to help with pain and swelling - Ice the shoulder regularly to help with pain and swelling - Perform pendulum excercises to the shoulder 2-3 times a day. Be sure to move the elbow, wrist and fingers to prevent swelling and stiffness - Please call to schedule a follow up appointment with Dr. Khan Chura at [phone] for 10-14 days following surgery  

## 2020-05-21 NOTE — Care Management Obs Status (Signed)
MEDICARE OBSERVATION STATUS NOTIFICATION   Patient Details  Name: [name] [name] MRN: [id number] Date of Birth: [birth year]   Medicare Observation Status Notification Given:  Yes    Mearl Latin, [name] 05/21/2020, 5:14 PM

## 2020-05-21 NOTE — Anesthesia Postprocedure Evaluation (Signed)
Anesthesia Post Note  Patient: [name] [name]  Procedure(s) Performed: REVERSE SHOULDER ARTHROPLASTY (Left Shoulder)     Patient location during evaluation: PACU Anesthesia Type: Regional Level of consciousness: sedated Pain management: pain level controlled Vital Signs Assessment: post-procedure vital signs reviewed and stable Respiratory status: spontaneous breathing and respiratory function stable Cardiovascular status: stable Postop Assessment: no apparent nausea or vomiting Anesthetic complications: no   No complications documented.  Last Vitals:  Vitals:   05/21/20 0313 05/21/20 0758  BP: (!) 153/85 125/64  Pulse: 72 73  Resp: 18 18  Temp: 36.8 C 37.2 C  SpO2: 98% 96%    Last Pain:  Vitals:   05/21/20 0758  TempSrc: Oral  PainSc:    Pain Goal: Patients Stated Pain Goal: 0 (05/21/20 0534)                 Mellody Dance

## 2020-05-21 NOTE — Progress Notes (Signed)
PROGRESS NOTE  [place] TDD:[id number] DOB: [birth year] DOA: 05/20/2020 PCP: Tracey Harries, MD  HPI/Recap of past 24 hours: HPI from Dr Jonne Ply is an 64 y.o. female with PMH of OSA on CPAP; mild dementia; depression; HLD; and congenital hearing loss presenting for reverse shoulder arthroplasty. Orthopedics consulted for significant paroxysmal coughing and reports sore throat post op.    Today, patient reports cough has significantly improved (throughout my visit in the room, patient did not have any episode of coughing), denies any chest pain, abdominal pain, nausea/vomiting, fever/chills.  Patient advised to contact PCP if cough reoccurs or gets worse.   Assessment/Plan: Principal Problem:   Rotator cuff tear arthropathy of left shoulder Active Problems:   Anxiety and depression   Obesity (BMI 30-39.9)   OSA on CPAP   Paroxysmal cough  Paroxysmal cough Currently improved Likely 2/2 status post intubation Afebrile, with no leukocytosis, no evidence of infection Continue as needed Chloraseptic, cough drops Follow-up with PCP  Left shoulder arthroplasty Further management per orthopedics  OSA Continue CPAP  Obesity Lifestyle modification advised    [facility] hospitalist will sign off        Malnutrition Type:      Malnutrition Characteristics:      Nutrition Interventions:       Estimated body mass index is 35.15 kg/m as calculated from the following:   Height as of this encounter: 5' (1.524 m).   Weight as of this encounter: 81.6 kg.     Code Status: Full  Family Communication: Discussed with patient at bedside  Disposition Plan: Status is: Observation  The patient remains OBS appropriate and will d/c before 2 midnights.  As per orthopedics who is the primary attending  Dispo: The patient is from: Home              Anticipated d/c is to: Home              Anticipated d/c date is: 1 day              Patient currently is  medically stable to d/c.     Consultants:  [facility] hospitalist  Procedures:  Left shoulder arthroplasty  Antimicrobials:  None  DVT prophylaxis: None-orthopedics managing   Objective: Vitals:   05/20/20 1916 05/20/20 2301 05/21/20 0313 05/21/20 0758  BP: 125/66 124/67 (!) 153/85 125/64  Pulse: 69 79 72 73  Resp: 20 18 18 18   Temp: 98.5 F (36.9 C) 98.8 F (37.1 C) 98.3 F (36.8 C) 98.9 F (37.2 C)  TempSrc: Oral Oral Oral Oral  SpO2: 96% 96% 98% 96%  Weight:      Height:        Intake/Output Summary (Last 24 hours) at 05/21/2020 1048 Last data filed at 05/20/2020 1410 Gross per 24 hour  Intake 360 ml  Output --  Net 360 ml   Filed Weights   05/20/20 0627  Weight: 81.6 kg    Exam:  General: NAD, pleasant  Cardiovascular: S1, S2 present  Respiratory: CTAB  Abdomen: Soft, nontender, nondistended, bowel sounds present  Musculoskeletal: No bilateral pedal edema noted, left shoulder sling noted, incision C/D/I  Skin: Normal  Psychiatry: Normal mood    Data Reviewed: CBC: Recent Labs  Lab 05/17/20 1108 05/21/20 0321  WBC 5.3 9.8  HGB 12.3 11.1*  HCT 38.4 34.6*  MCV 101.3* 101.2*  PLT 270 PLATELET CLUMPS NOTED ON SMEAR, UNABLE TO ESTIMATE   Basic Metabolic Panel: Recent Labs  Lab 05/21/20  0321  NA 138  K 3.6  CL 103  CO2 25  GLUCOSE 124*  BUN 10  CREATININE 0.80  CALCIUM 8.9   GFR: Estimated Creatinine Clearance: 68.9 mL/min (by C-G formula based on SCr of 0.8 mg/dL). Liver Function Tests: No results for input(s): AST, ALT, ALKPHOS, BILITOT, PROT, ALBUMIN in the last 168 hours. No results for input(s): LIPASE, AMYLASE in the last 168 hours. No results for input(s): AMMONIA in the last 168 hours. Coagulation Profile: No results for input(s): INR, PROTIME in the last 168 hours. Cardiac Enzymes: No results for input(s): CKTOTAL, CKMB, CKMBINDEX, TROPONINI in the last 168 hours. BNP (last 3 results) No results for input(s):  PROBNP in the last 8760 hours. HbA1C: No results for input(s): HGBA1C in the last 72 hours. CBG: No results for input(s): GLUCAP in the last 168 hours. Lipid Profile: No results for input(s): CHOL, HDL, LDLCALC, TRIG, CHOLHDL, LDLDIRECT in the last 72 hours. Thyroid Function Tests: No results for input(s): TSH, T4TOTAL, FREET4, T3FREE, THYROIDAB in the last 72 hours. Anemia Panel: No results for input(s): VITAMINB12, FOLATE, FERRITIN, TIBC, IRON, RETICCTPCT in the last 72 hours. Urine analysis:    Component Value Date/Time   COLORURINE YELLOW 11/18/2013 0928   APPEARANCEUR CLEAR 11/18/2013 0928   LABSPEC 1.007 11/18/2013 0928   PHURINE 6.5 11/18/2013 0928   GLUCOSEU NEGATIVE 11/18/2013 0928   HGBUR NEGATIVE 11/18/2013 0928   BILIRUBINUR NEGATIVE 11/18/2013 0928   KETONESUR NEGATIVE 11/18/2013 0928   PROTEINUR NEGATIVE 11/18/2013 0928   UROBILINOGEN 0.2 11/18/2013 0928   NITRITE NEGATIVE 11/18/2013 0928   LEUKOCYTESUR NEGATIVE 11/18/2013 0928   Sepsis Labs: @LABRCNTIP (procalcitonin:4,lacticidven:4)  ) Recent Results (from the past 240 hour(s))  Surgical pcr screen     Status: None   Collection Time: 05/17/20 10:49 AM   Specimen: Nasal Mucosa; Nasal Swab  Result Value Ref Range Status   MRSA, PCR NEGATIVE NEGATIVE Final   Staphylococcus aureus NEGATIVE NEGATIVE Final    Comment: (NOTE) The Xpert SA Assay (FDA approved for NASAL specimens in patients 75 years of age and older), is one component of a comprehensive surveillance program. It is not intended to diagnose infection nor to guide or monitor treatment. Performed at [facility], [address], [place], [place]   SARS CORONAVIRUS 2 (TAT 6-24 HRS) Nasopharyngeal Nasopharyngeal Swab     Status: None   Collection Time: 05/17/20 12:17 PM   Specimen: Nasopharyngeal Swab  Result Value Ref Range Status   SARS Coronavirus 2 NEGATIVE NEGATIVE Final    Comment: (NOTE) SARS-CoV-2 target nucleic acids are  NOT DETECTED.  The SARS-CoV-2 RNA is generally detectable in upper and lower respiratory specimens during the acute phase of infection. Negative results do not preclude SARS-CoV-2 infection, do not rule out co-infections with other pathogens, and should not be used as the sole basis for treatment or other patient management decisions. Negative results must be combined with clinical observations, patient history, and epidemiological information. The expected result is Negative.  Fact Sheet for Patients: 07/17/20  Fact Sheet for Healthcare Providers: HairSlick.no  This test is not yet approved or cleared by the quierodirigir.com FDA and  has been authorized for detection and/or diagnosis of SARS-CoV-2 by FDA under an Emergency Use Authorization (EUA). This EUA will remain  in effect (meaning this test can be used) for the duration of the COVID-19 declaration under Se ction 564(b)(1) of the Act, 21 U.S.C. section 360bbb-3(b)(1), unless the authorization is terminated or revoked sooner.  Performed at [facility], [address] [place], [place] [address]       Studies: No results found.  Scheduled Meds: . buPROPion  150 mg Oral q morning - 10a  . docusate sodium  100 mg Oral BID  . gabapentin  600 mg Oral TID  . lamoTRIgine  200 mg Oral Daily  . latanoprost  1 drop Both Eyes QHS  . scopolamine  1 patch Transdermal Q72H  . timolol  1 drop Both Eyes Daily  . venlafaxine XR  150 mg Oral Q breakfast    Continuous Infusions: . methocarbamol (ROBAXIN) IV       LOS: 0 days     Briant Cedar, MD [facility]  If 7PM-7AM, please contact night-coverage [email or web address] 05/21/2020, 10:48 AM

## 2020-05-21 NOTE — Progress Notes (Signed)
   Ortho Hand Progress Note  Subjective: No acute events last night. Pain well controlled.   Objective: Vital signs in last 24 hours: Temp:  [98.2 F (36.8 C)-99.3 F (37.4 C)] 98.9 F (37.2 C) (10/15 0758) Pulse Rate:  [54-99] 73 (10/15 0758) Resp:  [12-22] 18 (10/15 0758) BP: (98-153)/(54-85) 125/64 (10/15 0758) SpO2:  [93 %-100 %] 96 % (10/15 0758)  Intake/Output from previous day: 10/14 0701 - 10/15 0700 In: 1560 [P.O.:360; I.V.:1000; IV Piggyback:200] Out: 1200 [Urine:1000; Blood:200] Intake/Output this shift: No intake/output data recorded.  Recent Labs    05/21/20 0321  HGB 11.1*   Recent Labs    05/21/20 0321  WBC 9.8  RBC 3.42*  HCT 34.6*  PLT PLATELET CLUMPS NOTED ON SMEAR, UNABLE TO ESTIMATE   Recent Labs    05/21/20 0321  NA 138  K 3.6  CL 103  CO2 25  BUN 10  CREATININE 0.80  GLUCOSE 124*  CALCIUM 8.9   No results for input(s): LABPT, INR in the last 72 hours.  Aaox3 nad Resp nonlabored RRR LUE: arm in sling. Dressing in place which is c/d/i. Intact motion and sensation to the digits. Fingers wwp with bcr.   Assessment/Plan: Left shoulder rotator cuff tear arthropathy s/p L RSA DOS 10/14  - PT/OT. OK for some gentle shoulder ROM per protocol - Regular diet - Hospitalist following. Appreciate assistance - post op abx - up ad lib  - Plan for discharge home later today. Patient would like a home health aide.    [name] [name] 05/21/2020, 8:32 AM  [phone]

## 2020-05-21 NOTE — Discharge Summary (Signed)
Patient ID: [name] [name] MRN: [id number] DOB/AGE: 1955/12/07 64 y.o.  Admit date: 05/20/2020 Discharge date: 05/21/2020  Admission Diagnoses: Left shoulder rotator cuff tear arthropathy Past Medical History:  Diagnosis Date  . Anxiety   . Deafness    Wears Bilat hearing aids since Childhood  . Environmental and seasonal allergies   . Glaucoma, both eyes   . Hyperlipidemia   . Major depressive disorder   . Mild cognitive impairment, so stated 07/16/2013   Patient subjectively complains of word finding and memory deficits. MOCA 30-30 on 07-16-13 , paraphrasic errors- uses words such as  closet for garage   . OA (osteoarthritis)    "all over"  . Obstructive sleep apnea treated with BiPAP   . Right arm weakness    upper due to shoulder issues  . Wears glasses   . 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 female who was admitted 05/20/2020 with a chief complaint of left shoulder pain, and found to have a diagnosis of Left shoulder rotator cuff tear arthropathy.  They were brought to the operating room on 05/20/2020 and underwent Procedure(s): REVERSE SHOULDER ARTHROPLASTY.    They were given perioperative antibiotics:  Anti-infectives (From admission, onward)   Start     Dose/Rate Route Frequency Ordered Stop   05/20/20 1400  ceFAZolin (ANCEF) IVPB 2g/100 mL premix        2 g 200 mL/hr over 30 Minutes Intravenous Every 6 hours 05/20/20 1311 05/21/20 0150   05/20/20 0924  vancomycin (VANCOCIN) powder  Status:  Discontinued          As needed 05/20/20 0924 05/20/20 1001   05/20/20 0615   ceFAZolin (ANCEF) IVPB 2g/100 mL premix        2 g 200 mL/hr over 30 Minutes Intravenous On call to O.R. 05/20/20 0347 05/20/20 0756    .  They were given sequential compression devices, early ambulation, for DVT prophylaxis.  Recent vital signs:  Patient Vitals for the past 24 hrs:  BP Temp Temp src Pulse Resp SpO2  05/21/20 1146 129/74 98.6 F (37 C) Oral 83 18 95 %  05/21/20 0758 125/64 98.9 F (37.2 C) Oral 73 18 96 %  05/21/20 0313 (!) 153/85 98.3 F (36.8 C) Oral 72 18 98 %  05/20/20 2301 124/67 98.8 F (37.1 C) Oral 79 18 96 %  05/20/20 1916 125/66 98.5 F (36.9 C) Oral 69 20 96 %  .  Recent laboratory studies: DG Shoulder Left Port  Result Date: 05/20/2020 CLINICAL DATA:  Status post reversed total shoulder arthroplasty. EXAM: LEFT SHOULDER COMPARISON:  None. FINDINGS: Single frontal view of the left shoulder shows the patient to be status post reverse shoulder replacement. Gas in the joint space is compatible with the immediate postoperative state. No evidence for immediate hardware complication. Low lung volumes with left base atelectasis. IMPRESSION: Status post reverse left shoulder replacement. No evidence for immediate complicating features. Electronically Signed   By: Kennith Center M.D.   On: 05/20/2020 10:34    Discharge Medications:   Allergies as of 05/21/2020  Reactions   Aleve [naproxen]    bruising   Gramineae Pollens    Statins Other (See Comments)   Causing liver damage       Medication List    TAKE these medications   aspirin-sod bicarb-citric acid 325 MG Tbef tablet Commonly known as: ALKA-SELTZER Take 650 mg by mouth every 6 (six) hours as needed (indigestion).   buPROPion 150 MG 24 hr tablet Commonly known as: WELLBUTRIN XL Take 1 tablet (150 mg total) by mouth every morning.   Fish Oil 1200 MG Caps Take 1,200 mg by mouth daily.   gabapentin 300 MG capsule Commonly known as: NEURONTIN Take 600 mg by mouth 3 (three) times daily.    HYDROcodone-acetaminophen 5-325 MG tablet Commonly known as: NORCO/VICODIN Take 1-2 tablets by mouth every 4 (four) hours as needed for moderate pain (pain score 4-6).   ICY HOT ADVANCED RELIEF EX Apply 1 application topically 2 (two) times daily as needed (pain).   lamoTRIgine 200 MG tablet Commonly known as: LAMICTAL Take 1 tablet (200 mg total) by mouth daily.   latanoprost 0.005 % ophthalmic solution Commonly known as: XALATAN Place 1 drop into both eyes at bedtime.   methocarbamol 500 MG tablet Commonly known as: ROBAXIN Take 1 tablet (500 mg total) by mouth every 6 (six) hours as needed for muscle spasms.   timolol 0.5 % ophthalmic solution Commonly known as: TIMOPTIC Place 1 drop into both eyes daily.   venlafaxine XR 150 MG 24 hr capsule Commonly known as: EFFEXOR-XR TAKE 1 CAPSULE(150 MG) BY MOUTH DAILY WITH BREAKFAST What changed:   how much to take  how to take this  when to take this  additional instructions   vitamin B-12 1000 MCG tablet Commonly known as: CYANOCOBALAMIN Take 1,000 mcg by mouth daily.   zolpidem 5 MG tablet Commonly known as: AMBIEN Take 1 tablet (5 mg total) by mouth at bedtime. What changed:   when to take this  reasons to take this       Diagnostic Studies: DG Shoulder Left Port  Result Date: 05/20/2020 CLINICAL DATA:  Status post reversed total shoulder arthroplasty. EXAM: LEFT SHOULDER COMPARISON:  None. FINDINGS: Single frontal view of the left shoulder shows the patient to be status post reverse shoulder replacement. Gas in the joint space is compatible with the immediate postoperative state. No evidence for immediate hardware complication. Low lung volumes with left base atelectasis. IMPRESSION: Status post reverse left shoulder replacement. No evidence for immediate complicating features. Electronically Signed   By: Kennith Center M.D.   On: 05/20/2020 10:34    They benefited maximally from their hospital stay and there  were no complications.     Disposition: Discharge disposition: 01-Home or Self Care      Discharge Instructions    Call MD / Call 911   Complete by: As directed    If you experience chest pain or shortness of breath, CALL 911 and be transported to the hospital emergency room.  If you develope a fever above 101 F, pus (white drainage) or increased drainage or redness at the wound, or calf pain, call your surgeon's office.   Constipation Prevention   Complete by: As directed    Drink plenty of fluids.  Prune juice may be helpful.  You may use a stool softener, such as Colace (over the counter) 100 mg twice a day.  Use MiraLax (over the counter) for constipation as needed.   Diet - low sodium heart healthy  Complete by: As directed    Increase activity slowly as tolerated   Complete by: As directed       Follow-up Information    Cain Saupe III, MD. Schedule an appointment as soon as possible for a visit in 2 week(s).   Contact information: [address] [place] [address] [phone]        [facility], [facility] Follow up.   Specialty: Home Health Services Contact information: [address] [place] [address] [phone]                Signed: Ernest Mallick, MD  05/21/2020, 5:29 PM

## 2020-05-26 ENCOUNTER — Encounter (HOSPITAL_COMMUNITY): Payer: Self-pay | Admitting: Psychiatry

## 2020-05-26 ENCOUNTER — Other Ambulatory Visit: Payer: Self-pay

## 2020-05-26 ENCOUNTER — Telehealth (INDEPENDENT_AMBULATORY_CARE_PROVIDER_SITE_OTHER): Payer: Medicare Other | Admitting: Psychiatry

## 2020-05-26 DIAGNOSIS — F411 Generalized anxiety disorder: Secondary | ICD-10-CM | POA: Diagnosis not present

## 2020-05-26 DIAGNOSIS — F330 Major depressive disorder, recurrent, mild: Secondary | ICD-10-CM | POA: Diagnosis not present

## 2020-05-26 MED ORDER — VENLAFAXINE HCL ER 150 MG PO CP24: 150.0000 mg | ORAL_CAPSULE | Freq: Every day | ORAL | 0 refills | Status: DC

## 2020-05-26 MED ORDER — BUPROPION HCL ER (XL) 150 MG PO TB24: 150.0000 mg | ORAL_TABLET | Freq: Every morning | ORAL | 0 refills | Status: DC

## 2020-05-26 MED ORDER — LAMOTRIGINE 200 MG PO TABS: 200.0000 mg | ORAL_TABLET | Freq: Every day | ORAL | 0 refills | Status: DC

## 2020-05-26 NOTE — Progress Notes (Signed)
Virtual Visit via Telephone Note  I connected with [name] [name] on 05/26/20 at 11:00 AM EDT by telephone and verified that I am speaking with the correct person using two identifiers.  Location: Patient: home Provider: home office   I discussed the limitations, risks, security and privacy concerns of performing an evaluation and management service by telephone and the availability of in person appointments. I also discussed with the patient that there may be a patient responsible charge related to this service. The patient expressed understanding and agreed to proceed.   History of Present Illness: Patient is evaluated by phone session.  She recently had left shoulder surgery and she is in a lot of pain but also taking pain medication.  Overall she feels her depression and anxiety is under control.  Her living situation is much better since she is living with her father.  She is now separated from [place] for more than 10 months.  She still gets some time emotional but she is adjusting with the fact that it is best to remain separated from [place]  She is not taking Ambien because she is getting pain medicine and muscle relaxant.  She is also not taking gabapentin because her physician took her off as it did not work.  Patient denies any irritability, anger, mania or any highs and lows.  She denies any crying spells or any feeling of hopelessness.  She like to keep her Wellbutrin, Effexor and Lamictal.  Her energy level is fair.  She is hoping once she recovered from left shoulder surgery then she can do the right shoulder surgery.  Past Psychiatric History:Reviewed. H/Oinpatientin 2010 when brother committed suicide.Tookmedssince 2008. TookProzac(manic),Paxil(didnot help) andAbilify(headaches and diarrhea). No h/osuicidal attempt or hallucinations.   Psychiatric Specialty Exam: Physical Exam  Review of Systems  Weight 180 lb (81.6 kg).There is no height or weight on file to  calculate BMI.  General Appearance: NA  Eye Contact:  NA  Speech:  Normal Rate  Volume:  Normal  Mood:  Euthymic  Affect:  NA  Thought Process:  Goal Directed  Orientation:  Full (Time, Place, and Person)  Thought Content:  WDL  Suicidal Thoughts:  No  Homicidal Thoughts:  No  Memory:  Immediate;   Good Recent;   Good Remote;   Good  Judgement:  Intact  Insight:  Present  Psychomotor Activity:  NA  Concentration:  Concentration: Good and Attention Span: Good  Recall:  Good  Fund of Knowledge:  Good  Language:  Good  Akathisia:  No  Handed:  Right  AIMS (if indicated):     Assets:  Communication Skills Desire for Improvement Housing  ADL's:  Intact  Cognition:  WNL  Sleep:   ok     Assessment and Plan: Major depressive disorder, recurrent.  Generalized anxiety disorder.  Patient is slowly and gradually getting better from her left shoulder surgery.  She does not want to change the medication.  We will continue Effexor 150 mg daily, Lamictal 200 mg daily and Wellbutrin XL 150 mg daily.  We will hold the Ambien since patient is taking narcotic pain medication and muscle relaxant and does not need anything for sleep.  Discussed medication side effects and benefits.  Recommended to call us back if she is any question or any concern.  Follow-up in 3 months.  Follow Up Instructions:    I discussed the assessment and treatment plan with the patient. The patient was provided an opportunity to ask questions and all  were answered. The patient agreed with the plan and demonstrated an understanding of the instructions.   The patient was advised to call back or seek an in-person evaluation if the symptoms worsen or if the condition fails to improve as anticipated.  I provided 17 minutes of non-face-to-face time during this encounter.   Cleotis Nipper, MD

## 2020-06-11 ENCOUNTER — Other Ambulatory Visit (HOSPITAL_COMMUNITY): Payer: Self-pay | Admitting: Psychiatry

## 2020-06-11 DIAGNOSIS — F330 Major depressive disorder, recurrent, mild: Secondary | ICD-10-CM

## 2020-06-14 ENCOUNTER — Telehealth (HOSPITAL_COMMUNITY): Payer: Self-pay | Admitting: *Deleted

## 2020-06-14 DIAGNOSIS — F330 Major depressive disorder, recurrent, mild: Secondary | ICD-10-CM

## 2020-06-14 NOTE — Telephone Encounter (Signed)
Pt called requesting refill on the Ambien. Pt next appointment on 08/14/19. Per last visit on 05/26/20 pt was not taking due to having had surgery and taking pain and muscle relaxants helping with sleep. Please review and advise.

## 2020-06-15 MED ORDER — ZOLPIDEM TARTRATE 5 MG PO TABS: 5.0000 mg | ORAL_TABLET | Freq: Every day | ORAL | 0 refills | Status: DC

## 2020-06-15 NOTE — Telephone Encounter (Signed)
Done

## 2020-07-19 ENCOUNTER — Other Ambulatory Visit (HOSPITAL_COMMUNITY): Payer: Self-pay | Admitting: Psychiatry

## 2020-07-19 DIAGNOSIS — F330 Major depressive disorder, recurrent, mild: Secondary | ICD-10-CM

## 2020-07-27 ENCOUNTER — Other Ambulatory Visit (HOSPITAL_COMMUNITY): Payer: Self-pay | Admitting: *Deleted

## 2020-07-27 DIAGNOSIS — F330 Major depressive disorder, recurrent, mild: Secondary | ICD-10-CM

## 2020-07-27 MED ORDER — ZOLPIDEM TARTRATE 5 MG PO TABS: 5.0000 mg | ORAL_TABLET | Freq: Every day | ORAL | 0 refills | Status: DC

## 2020-08-23 ENCOUNTER — Other Ambulatory Visit: Payer: Self-pay

## 2020-08-23 ENCOUNTER — Encounter (HOSPITAL_COMMUNITY): Payer: Self-pay | Admitting: Psychiatry

## 2020-08-23 ENCOUNTER — Telehealth (INDEPENDENT_AMBULATORY_CARE_PROVIDER_SITE_OTHER): Payer: Medicare Other | Admitting: Psychiatry

## 2020-08-23 VITALS — Wt 180.0 lb

## 2020-08-23 DIAGNOSIS — F5101 Primary insomnia: Secondary | ICD-10-CM | POA: Diagnosis not present

## 2020-08-23 DIAGNOSIS — F330 Major depressive disorder, recurrent, mild: Secondary | ICD-10-CM | POA: Diagnosis not present

## 2020-08-23 DIAGNOSIS — F411 Generalized anxiety disorder: Secondary | ICD-10-CM | POA: Diagnosis not present

## 2020-08-23 MED ORDER — BUPROPION HCL ER (XL) 150 MG PO TB24: 150.0000 mg | ORAL_TABLET | Freq: Every morning | ORAL | 0 refills | Status: DC

## 2020-08-23 MED ORDER — LAMOTRIGINE 200 MG PO TABS: 200.0000 mg | ORAL_TABLET | Freq: Every day | ORAL | 0 refills | Status: DC

## 2020-08-23 MED ORDER — ZOLPIDEM TARTRATE 5 MG PO TABS: 5.0000 mg | ORAL_TABLET | Freq: Every evening | ORAL | 0 refills | Status: DC | PRN

## 2020-08-23 MED ORDER — VENLAFAXINE HCL ER 150 MG PO CP24: 150.0000 mg | ORAL_CAPSULE | Freq: Every day | ORAL | 0 refills | Status: DC

## 2020-08-23 NOTE — Progress Notes (Signed)
Virtual Visit via Telephone Note  I connected with [name] [name] on 08/23/20 at  1:00 PM EST by telephone and verified that I am speaking with the correct person using two identifiers.  Location: Patient: Home Provider: Home Office   I discussed the limitations, risks, security and privacy concerns of performing an evaluation and management service by telephone and the availability of in person appointments. I also discussed with the patient that there may be a patient responsible charge related to this service. The patient expressed understanding and agreed to proceed.   History of Present Illness: Patient is evaluated by phone session.  She is on the phone by herself.  She is been doing very well and reported no anxiety or depression.  She takes Ambien 3 times a week which helps her sleep.  She recovered from shoulder surgery but she had a cough last month and that causes hernia which required procedure but now she is feeling better.  She is no longer taking any pain medication.  Her energy level is good.  Her appetite is okay.  She denies any crying spells or any feeling of hopelessness.  Since her divorce is final with the [name] she is more relaxed.  She is very happy living with her father.  She does not want to change the medication since it is working well.  She has no tremors, shakes or any EPS.  She denies any panic attack.  She had a good Christmas.   Past Psychiatric History:Reviewed. H/Oinpatientin 2010 after brother committed suicide. On meds since 2008. TookProzac(manic),Paxil(didnot help) andAbilify(headaches and diarrhea). No h/osuicidal attempt or hallucinations.    Psychiatric Specialty Exam: Physical Exam  Review of Systems  Weight 180 lb (81.6 kg).There is no height or weight on file to calculate BMI.  General Appearance: NA  Eye Contact:  NA  Speech:  Slow  Volume:  Normal  Mood:  Euthymic  Affect:  NA  Thought Process:  Descriptions of Associations:  Intact  Orientation:  Full (Time, Place, and Person)  Thought Content:  Logical  Suicidal Thoughts:  No  Homicidal Thoughts:  No  Memory:  Immediate;   Good Recent;   Good Remote;   Good  Judgement:  Intact  Insight:  Present  Psychomotor Activity:  NA  Concentration:  Concentration: Good and Attention Span: Good  Recall:  Good  Fund of Knowledge:  Good  Language:  Good  Akathisia:  No  Handed:  Right  AIMS (if indicated):     Assets:  Communication Skills Desire for Improvement Housing Social Support Transportation  ADL's:  Intact  Cognition:  WNL  Sleep:   ok     Assessment and Plan: Major depressive disorder, recurrent.  Anxiety.  Primary insomnia.  Patient doing well and stable on her current medication.  She is not taking any narcotic pain medication.  Continue Lamictal 200 mg daily, Wellbutrin XL 150 mg daily, Effexor 150 mg daily and Ambien 5 mg to take as needed for insomnia.  Recommended to call us back if there is any question or any concern.  Follow-up in 3 months.  Follow Up Instructions:    I discussed the assessment and treatment plan with the patient. The patient was provided an opportunity to ask questions and all were answered. The patient agreed with the plan and demonstrated an understanding of the instructions.   The patient was advised to call back or seek an in-person evaluation if the symptoms worsen or if the condition fails to  improve as anticipated.  I provided 16 minutes of non-face-to-face time during this encounter.   Kathlee Nations, MD

## 2020-10-08 ENCOUNTER — Other Ambulatory Visit (HOSPITAL_COMMUNITY): Payer: Self-pay | Admitting: Psychiatry

## 2020-10-08 DIAGNOSIS — F5101 Primary insomnia: Secondary | ICD-10-CM

## 2020-10-08 DIAGNOSIS — F330 Major depressive disorder, recurrent, mild: Secondary | ICD-10-CM

## 2020-10-18 ENCOUNTER — Telehealth (HOSPITAL_COMMUNITY): Payer: Self-pay | Admitting: *Deleted

## 2020-10-18 ENCOUNTER — Other Ambulatory Visit (HOSPITAL_COMMUNITY): Payer: Self-pay | Admitting: Psychiatry

## 2020-10-18 DIAGNOSIS — F5101 Primary insomnia: Secondary | ICD-10-CM

## 2020-10-18 DIAGNOSIS — F330 Major depressive disorder, recurrent, mild: Secondary | ICD-10-CM

## 2020-10-18 MED ORDER — ZOLPIDEM TARTRATE 5 MG PO TABS: 5.0000 mg | ORAL_TABLET | Freq: Every evening | ORAL | 0 refills | Status: DC | PRN

## 2020-10-18 NOTE — Telephone Encounter (Signed)
Patient is requested refill of Ambien.  Please review.

## 2020-10-18 NOTE — Telephone Encounter (Signed)
Done and send to [facility]. She takes only as needed.

## 2020-11-16 ENCOUNTER — Other Ambulatory Visit: Payer: Self-pay

## 2020-11-16 ENCOUNTER — Telehealth (INDEPENDENT_AMBULATORY_CARE_PROVIDER_SITE_OTHER): Payer: Medicare Other | Admitting: Psychiatry

## 2020-11-16 DIAGNOSIS — F411 Generalized anxiety disorder: Secondary | ICD-10-CM

## 2020-11-16 DIAGNOSIS — F5101 Primary insomnia: Secondary | ICD-10-CM | POA: Diagnosis not present

## 2020-11-16 DIAGNOSIS — F330 Major depressive disorder, recurrent, mild: Secondary | ICD-10-CM | POA: Diagnosis not present

## 2020-11-16 MED ORDER — BUPROPION HCL ER (XL) 150 MG PO TB24: 150.0000 mg | ORAL_TABLET | Freq: Every morning | ORAL | 0 refills | Status: DC

## 2020-11-16 MED ORDER — VENLAFAXINE HCL ER 150 MG PO CP24: 150.0000 mg | ORAL_CAPSULE | Freq: Every day | ORAL | 0 refills | Status: DC

## 2020-11-16 MED ORDER — LAMOTRIGINE 200 MG PO TABS: 200.0000 mg | ORAL_TABLET | Freq: Every day | ORAL | 0 refills | Status: DC

## 2020-11-16 NOTE — Progress Notes (Signed)
Virtual Visit via Telephone Note  I connected with [name] [name] on 11/16/20 at  1:00 PM EDT by telephone and verified that I am speaking with the correct person using two identifiers.  Location: Patient: home Provider: home office   I discussed the limitations, risks, security and privacy concerns of performing an evaluation and management service by telephone and the availability of in person appointments. I also discussed with the patient that there may be a patient responsible charge related to this service. The patient expressed understanding and agreed to proceed.   History of Present Illness: Patient is evaluated by phone session.  She is doing well on her current medication.  There are nights when she requires Ambien but she usually take half tablet.  She is very happy with her living situation since divorce is final.  She still gets text and messages from [place] but she does not want to go back with her.  She even declined the request to be friend from [place]  Overall she described her mood is good.  She is sleeping most of the nights good.  She denies any crying spells or any feeling of hopelessness or worthlessness.  Her shoulder pain is also much better and she may not need surgery on the right shoulder as she was given steroids lately which is helping her.  She is fully recovered from her left shoulder surgery.  Her appetite is okay.  Her energy level is good.  She is able to lost 10 pounds from the past and she understand it is a working progress but she like to keep it.  She is more active from the past.  She stopped eating ice cream and potato chips.  She has no tremors, shakes or any EPS.  She is no longer taking any narcotic pain medication.  Past Psychiatric History:Reviewed. H/Oinpatientin 2010 after brother committed suicide. On meds since 2008. TookProzac(manic),Paxil(didnot help) andAbilify(headaches and diarrhea). No h/osuicidal attempt or hallucinations.     Psychiatric Specialty Exam: Physical Exam  Review of Systems  There were no vitals taken for this visit.There is no height or weight on file to calculate BMI.  General Appearance: NA  Eye Contact:  NA  Speech:  Clear and Coherent  Volume:  Normal  Mood:  Euthymic  Affect:  NA  Thought Process:  Goal Directed  Orientation:  Full (Time, Place, and Person)  Thought Content:  WDL  Suicidal Thoughts:  No  Homicidal Thoughts:  No  Memory:  Immediate;   Good Recent;   Good Remote;   Good  Judgement:  Good  Insight:  Present  Psychomotor Activity:  NA  Concentration:  Concentration: Good and Attention Span: Good  Recall:  Good  Fund of Knowledge:  Good  Language:  Good  Akathisia:  No  Handed:  Right  AIMS (if indicated):     Assets:  Communication Skills Desire for Improvement Housing Resilience Social Support Transportation  ADL's:  Intact  Cognition:  WNL  Sleep:   ok      Assessment and Plan: Major depressive disorder, recurrent.  Anxiety.  Primary insomnia.  Patient doing well on her current medication.  She rarely takes Ambien and does not need a new refill at this time.  She lost weight but reluctant to give the numbers on today's visit.  Now her plan is to do annual physical and blood work with her PCP Dr. Hermenia Fiscal.  Encouraged to do physical and blood work with PCP.  Patient like to  keep the current medication.  Continue Wellbutrin XL 150 mg daily, Effexor 150 mg daily Lamictal 200 mg daily and Ambien 5 mg as needed.  Patient will call for new refills of Ambien when she needed.  Recommended to call us back if she has any question of any concern.  Follow-up in 3 months.  Follow Up Instructions:    I discussed the assessment and treatment plan with the patient. The patient was provided an opportunity to ask questions and all were answered. The patient agreed with the plan and demonstrated an understanding of the instructions.   The patient was advised to call back or  seek an in-person evaluation if the symptoms worsen or if the condition fails to improve as anticipated.  I provided 16 minutes of non-face-to-face time during this encounter.   Cleotis Nipper, MD

## 2020-11-29 ENCOUNTER — Other Ambulatory Visit (HOSPITAL_COMMUNITY): Payer: Self-pay | Admitting: Psychiatry

## 2020-11-29 DIAGNOSIS — F5101 Primary insomnia: Secondary | ICD-10-CM

## 2020-11-29 DIAGNOSIS — F330 Major depressive disorder, recurrent, mild: Secondary | ICD-10-CM

## 2020-12-06 ENCOUNTER — Telehealth (HOSPITAL_COMMUNITY): Payer: Self-pay | Admitting: *Deleted

## 2020-12-06 DIAGNOSIS — F5101 Primary insomnia: Secondary | ICD-10-CM

## 2020-12-06 DIAGNOSIS — F330 Major depressive disorder, recurrent, mild: Secondary | ICD-10-CM

## 2020-12-06 NOTE — Telephone Encounter (Signed)
Pt called requesting refill of Ambien be sent to [facility]. Pt has an appointment upcoming on 02/01/21. Thanks.

## 2020-12-07 MED ORDER — ZOLPIDEM TARTRATE 5 MG PO TABS: 5.0000 mg | ORAL_TABLET | Freq: Every evening | ORAL | 0 refills | Status: DC | PRN

## 2020-12-07 NOTE — Telephone Encounter (Signed)
Done

## 2021-01-10 ENCOUNTER — Other Ambulatory Visit (HOSPITAL_COMMUNITY): Payer: Self-pay | Admitting: Psychiatry

## 2021-01-10 DIAGNOSIS — F5101 Primary insomnia: Secondary | ICD-10-CM

## 2021-01-10 DIAGNOSIS — F330 Major depressive disorder, recurrent, mild: Secondary | ICD-10-CM

## 2021-01-12 ENCOUNTER — Telehealth (HOSPITAL_COMMUNITY): Payer: Self-pay | Admitting: *Deleted

## 2021-01-12 DIAGNOSIS — F5101 Primary insomnia: Secondary | ICD-10-CM

## 2021-01-12 DIAGNOSIS — F330 Major depressive disorder, recurrent, mild: Secondary | ICD-10-CM

## 2021-01-12 NOTE — Telephone Encounter (Signed)
Pt called requesting refill of Ambien 5 mg be sent to [facility]. Pt has an appointment on 02/01/21.

## 2021-01-13 MED ORDER — ZOLPIDEM TARTRATE 5 MG PO TABS: 5.0000 mg | ORAL_TABLET | Freq: Every evening | ORAL | 0 refills | Status: DC | PRN

## 2021-01-13 NOTE — Telephone Encounter (Signed)
Done

## 2021-02-01 ENCOUNTER — Other Ambulatory Visit: Payer: Self-pay

## 2021-02-01 ENCOUNTER — Encounter (HOSPITAL_COMMUNITY): Payer: Self-pay | Admitting: Psychiatry

## 2021-02-01 ENCOUNTER — Telehealth (INDEPENDENT_AMBULATORY_CARE_PROVIDER_SITE_OTHER): Payer: Medicare Other | Admitting: Psychiatry

## 2021-02-01 DIAGNOSIS — F5101 Primary insomnia: Secondary | ICD-10-CM

## 2021-02-01 DIAGNOSIS — F330 Major depressive disorder, recurrent, mild: Secondary | ICD-10-CM

## 2021-02-01 DIAGNOSIS — F411 Generalized anxiety disorder: Secondary | ICD-10-CM

## 2021-02-01 MED ORDER — ZOLPIDEM TARTRATE 5 MG PO TABS: 5.0000 mg | ORAL_TABLET | Freq: Every evening | ORAL | 1 refills | Status: DC | PRN

## 2021-02-01 MED ORDER — LAMOTRIGINE 200 MG PO TABS: 200.0000 mg | ORAL_TABLET | Freq: Every day | ORAL | 0 refills | Status: DC

## 2021-02-01 MED ORDER — BUPROPION HCL ER (XL) 150 MG PO TB24: 150.0000 mg | ORAL_TABLET | Freq: Every morning | ORAL | 0 refills | Status: DC

## 2021-02-01 MED ORDER — VENLAFAXINE HCL ER 150 MG PO CP24: 150.0000 mg | ORAL_CAPSULE | Freq: Every day | ORAL | 0 refills | Status: DC

## 2021-02-01 NOTE — Progress Notes (Signed)
Virtual Visit via Telephone Note  I connected with [name] [name] on 02/01/21 at  2:20 PM EDT by telephone and verified that I am speaking with the correct person using two identifiers.  Location: Patient: Home Provider: Home Office   I discussed the limitations, risks, security and privacy concerns of performing an evaluation and management service by telephone and the availability of in person appointments. I also discussed with the patient that there may be a patient responsible charge related to this service. The patient expressed understanding and agreed to proceed.   History of Present Illness: Patient is evaluated by phone session.  She is taking all her medication and actually doing very well.  Sometimes she gets nervous and anxious when she watched the world news and concerned about the [place] war.  She has very limited contact with [name] and recently she got upset when [name] ask money for taking care of the 4 dogs that she had left.  Patient lives with her father and does not want to bring dog at her father's place.  Patient told [name] is going to let these dogs for adaptation and she is fine but she did not let her see them and that upsets her a lot.  However she is quickly overcome with that and helps.  She started walking every day.  She has lost more than 15 pounds in past few months.  Patient told she had a COVID 2-1/2 months ago and it was difficult because she could not eat well.  However since she lost the weight she feels more energetic.  She is taking Ambien every other day as it helped her sleep.  She has right shoulder pain and sometimes she takes pain medication.  She is scheduled to have cataract surgery on her left eye and she is ready for that surgery.  Patient overall doing well on the medication and like to keep the current medication.  She has no rash, itching, tremors or shakes.  She denies any panic attack.   Past Psychiatric History: Reviewed. H/O inpatient in 2010  after brother committed suicide. On meds since 2008.  Took Prozac (manic), Paxil (did not help) and Abilify (headaches and diarrhea).  No h/o suicidal attempt or hallucinations.     Psychiatric Specialty Exam: Physical Exam  Review of Systems  Weight 160 lb (72.6 kg).There is no height or weight on file to calculate BMI.  General Appearance: NA  Eye Contact:  NA  Speech:  Clear and Coherent  Volume:  Normal  Mood:  Euthymic  Affect:  NA  Thought Process:  Goal Directed  Orientation:  Full (Time, Place, and Person)  Thought Content:  WDL  Suicidal Thoughts:  No  Homicidal Thoughts:  No  Memory:  Immediate;   Good Recent;   Good Remote;   Good  Judgement:  Intact  Insight:  Present  Psychomotor Activity:  NA  Concentration:  Concentration: Good and Attention Span: Good  Recall:  Good  Fund of Knowledge:  Good  Language:  Good  Akathisia:  No  Handed:  Right  AIMS (if indicated):     Assets:  Communication Skills Desire for Improvement Housing Resilience Social Support Transportation  ADL's:  Intact  Cognition:  WNL  Sleep:   ok      Assessment and Plan: Major depressive disorder, recurrent.  Anxiety.  Primary insomnia.  I reviewed blood work results from April 29 done by her PCP.  Her cholesterol is 321, BUN 14 and creatinine  0.71.  Her last hemoglobin A1c was normal.  Encourage not to watch world news every day as it bothers her a lot.  Discussed medication side effects and benefits.  Discussed hypnotics tolerance, withdrawal and dependency.  Continue Ambien 5 mg to take as needed for insomnia, Wellbutrin XL 150 mg daily, Lamictal 200 mg daily and Effexor 150 mg daily.  Recommended to call us back if she has any question or any concern.  Follow-up in 3 months.  Follow Up Instructions:    I discussed the assessment and treatment plan with the patient. The patient was provided an opportunity to ask questions and all were answered. The patient agreed with the plan and  demonstrated an understanding of the instructions.   The patient was advised to call back or seek an in-person evaluation if the symptoms worsen or if the condition fails to improve as anticipated.  I provided 18 minutes of non-face-to-face time during this encounter.   Cleotis Nipper, MD

## 2021-02-12 ENCOUNTER — Other Ambulatory Visit (HOSPITAL_COMMUNITY): Payer: Self-pay | Admitting: Psychiatry

## 2021-02-12 DIAGNOSIS — F330 Major depressive disorder, recurrent, mild: Secondary | ICD-10-CM

## 2021-03-04 ENCOUNTER — Other Ambulatory Visit: Payer: Self-pay | Admitting: Physician Assistant

## 2021-03-04 DIAGNOSIS — M25512 Pain in left shoulder: Secondary | ICD-10-CM

## 2021-03-07 ENCOUNTER — Other Ambulatory Visit (HOSPITAL_COMMUNITY): Payer: Self-pay | Admitting: Psychiatry

## 2021-03-07 DIAGNOSIS — F411 Generalized anxiety disorder: Secondary | ICD-10-CM

## 2021-03-07 DIAGNOSIS — F5101 Primary insomnia: Secondary | ICD-10-CM

## 2021-03-07 DIAGNOSIS — F330 Major depressive disorder, recurrent, mild: Secondary | ICD-10-CM

## 2021-03-09 ENCOUNTER — Ambulatory Visit
Admission: RE | Admit: 2021-03-09 | Discharge: 2021-03-09 | Disposition: A | Discharge status: Home / Self Care | Payer: Medicare Other | Source: Ambulatory Visit | Attending: Physician Assistant | Admitting: Physician Assistant

## 2021-03-09 ENCOUNTER — Other Ambulatory Visit: Payer: Self-pay

## 2021-03-09 DIAGNOSIS — M25512 Pain in left shoulder: Secondary | ICD-10-CM

## 2021-04-18 ENCOUNTER — Other Ambulatory Visit (HOSPITAL_COMMUNITY): Payer: Self-pay | Admitting: Psychiatry

## 2021-04-18 DIAGNOSIS — F330 Major depressive disorder, recurrent, mild: Secondary | ICD-10-CM

## 2021-04-22 ENCOUNTER — Other Ambulatory Visit (HOSPITAL_COMMUNITY): Payer: Self-pay | Admitting: Psychiatry

## 2021-04-22 DIAGNOSIS — F330 Major depressive disorder, recurrent, mild: Secondary | ICD-10-CM

## 2021-04-22 DIAGNOSIS — F411 Generalized anxiety disorder: Secondary | ICD-10-CM

## 2021-04-25 ENCOUNTER — Other Ambulatory Visit (HOSPITAL_COMMUNITY): Payer: Self-pay | Admitting: Psychiatry

## 2021-04-25 DIAGNOSIS — F330 Major depressive disorder, recurrent, mild: Secondary | ICD-10-CM

## 2021-04-25 NOTE — Progress Notes (Addendum)
[place]- Preparing for Total Shoulder Arthroplasty    Before surgery, you can play an important role. Because skin is not sterile, your skin needs to be as free of germs as possible. You can reduce the number of germs on your skin by using the following products. Benzoyl Peroxide Gel Reduces the number of germs present on the skin Applied twice a day to shoulder area starting two days before surgery    ==================================================================  Please follow these instructions carefully:  BENZOYL PEROXIDE 5% GEL  Please do not use if you have an allergy to benzoyl peroxide.   If your skin becomes reddened/irritated stop using the benzoyl peroxide.  Starting two days before surgery, apply as follows: Apply benzoyl peroxide in the morning and at night. Apply after taking a shower. If you are not taking a shower clean entire shoulder front, back, and side along with the armpit with a clean wet washcloth.  Place a quarter-sized dollop on your shoulder and rub in thoroughly, making sure to cover the front, back, and side of your shoulder, along with the armpit.   2 days before ____ AM   ____ PM              1 day before ____ AM   ____ PM                         Do this twice a day for two days.  (Last application is the night before surgery, AFTER using the CHG soap as described below).  Do NOT apply benzoyl peroxide gel on the day of surgery. DUE TO COVID-19 ONLY ONE VISITOR IS ALLOWED TO COME WITH YOU AND STAY IN THE WAITING ROOM ONLY DURING PRE OP AND PROCEDURE DAY OF SURGERY.  2 VISITOR  MAY VISIT WITH YOU AFTER SURGERY IN YOUR PRIVATE ROOM DURING VISITING HOURS ONLY!  YOU NEED TO HAVE A COVID 19 TEST ON______AME@_  @_from  8am-3pm _____, THIS TEST MUST BE DONE BEFORE SURGERY,  Covid test is done at [address] [place], [place] [address].  This is a drive thru.  No appt required. Please see map.                 Your procedure is scheduled on:     05/12/2021   Report to [facility] Main  Entrance   Report to admitting at    [phone]     Call this number if you have problems the morning of surgery [phone]    REMEMBER: NO  SOLID FOOD CANDY OR GUM AFTER MIDNIGHT. CLEAR LIQUIDS UNTIL  0700am        . NOTHING BY MOUTH EXCEPT CLEAR LIQUIDS UNTIL   07-17-1998 am  . PLEASE FINISH ENSURE DRINK PER SURGEON ORDER  WHICH NEEDS TO BE COMPLETED AT  0700am     .      CLEAR LIQUID DIET   Foods Allowed                                                                    Coffee and tea, regular and decaf  Fruit ices (not with fruit pulp)                                      Iced Popsicles                                    Carbonated beverages, regular and diet                                    Cranberry, grape and apple juices Sports drinks like Gatorade Lightly seasoned clear broth or consume(fat free) Sugar, honey syrup ___________________________________________________________________      BRUSH YOUR TEETH MORNING OF SURGERY AND RINSE YOUR MOUTH OUT, NO CHEWING GUM CANDY OR MINTS.     Take these medicines the morning of surgery with A SIP OF WATER:  wellbutrin, lamictal, eye drops as usual and effexor   DO NOT TAKE ANY DIABETIC MEDICATIONS DAY OF YOUR SURGERY                               You may not have any metal on your body including hair pins and              piercings  Do not wear jewelry, make-up, lotions, powders or perfumes, deodorant             Do not wear nail polish on your fingernails.  Do not shave  48 hours prior to surgery.              Men may shave face and neck.   Do not bring valuables to the hospital. [facility] IS NOT             RESPONSIBLE   FOR VALUABLES.  Contacts, dentures or bridgework may not be worn into surgery.  Leave suitcase in the car. After surgery it may be brought to your room.     Patients discharged the day of surgery will not be allowed to drive home.  IF YOU ARE HAVING SURGERY AND GOING HOME THE SAME DAY, YOU MUST HAVE AN ADULT TO DRIVE YOU HOME AND BE WITH YOU FOR 24 HOURS. YOU MAY GO HOME BY TAXI OR UBER OR ORTHERWISE, BUT AN ADULT MUST ACCOMPANY YOU HOME AND STAY WITH YOU FOR 24 HOURS.  Name and phone number of your driver:  Special Instructions: N/A              Please read over the following fact sheets you were given: _____________________________________________________________________  [facility] - Preparing for Surgery Before surgery, you can play an important role.  Because skin is not sterile, your skin needs to be as free of germs as possible.  You can reduce the number of germs on your skin by washing with CHG (chlorahexidine gluconate) soap before surgery.  CHG is an antiseptic cleaner which kills germs and bonds with the skin to continue killing germs even after washing. Please DO NOT use if you have an allergy to CHG or antibacterial soaps.  If your skin becomes reddened/irritated stop using the CHG and inform your nurse when you arrive at Short Stay. Do not shave (including legs and underarms) for at least 48 hours prior to the first  CHG shower.  You may shave your face/neck. Please follow these instructions carefully:  1.  Shower with CHG Soap the night before surgery and the  morning of Surgery.  2.  If you choose to wash your hair, wash your hair first as usual with your  normal  shampoo.  3.  After you shampoo, rinse your hair and body thoroughly to remove the  shampoo.                           4.  Use CHG as you would any other liquid soap.  You can apply chg directly  to the skin and wash                       Gently with a scrungie or clean washcloth.  5.  Apply the CHG Soap to your body ONLY FROM THE NECK DOWN.   Do not use on face/ open                           Wound or open sores. Avoid contact with eyes, ears mouth and genitals (private parts).                       Wash face,  Genitals (private parts) with your  normal soap.             6.  Wash thoroughly, paying special attention to the area where your surgery  will be performed.  7.  Thoroughly rinse your body with warm water from the neck down.  8.  DO NOT shower/wash with your normal soap after using and rinsing off  the CHG Soap.                9.  Pat yourself dry with a clean towel.            10.  Wear clean pajamas.            11.  Place clean sheets on your bed the night of your first shower and do not  sleep with pets. Day of Surgery : Do not apply any lotions/deodorants the morning of surgery.  Please wear clean clothes to the hospital/surgery center.  FAILURE TO FOLLOW THESE INSTRUCTIONS MAY RESULT IN THE CANCELLATION OF YOUR SURGERY PATIENT SIGNATURE_________________________________  NURSE SIGNATURE__________________________________  ________________________________________________________________________

## 2021-04-26 ENCOUNTER — Encounter (HOSPITAL_COMMUNITY): Payer: Self-pay

## 2021-04-26 ENCOUNTER — Other Ambulatory Visit: Payer: Self-pay

## 2021-04-26 ENCOUNTER — Encounter (HOSPITAL_COMMUNITY)
Admission: RE | Admit: 2021-04-26 | Discharge: 2021-04-26 | Disposition: A | Discharge status: Home / Self Care | Payer: Medicare Other | Source: Ambulatory Visit | Attending: Orthopedic Surgery | Admitting: Orthopedic Surgery

## 2021-04-26 DIAGNOSIS — Z01812 Encounter for preprocedural laboratory examination: Secondary | ICD-10-CM | POA: Insufficient documentation

## 2021-04-26 LAB — CBC
HCT: 40.2 % (ref 36.0–46.0)
Hemoglobin: 13.2 g/dL (ref 12.0–15.0)
MCH: 32.5 pg (ref 26.0–34.0)
MCHC: 32.8 g/dL (ref 30.0–36.0)
MCV: 99 fL (ref 80.0–100.0)
Platelets: 295 10*3/uL (ref 150–400)
RBC: 4.06 MIL/uL (ref 3.87–5.11)
RDW: 11.9 % (ref 11.5–15.5)
WBC: 5.4 10*3/uL (ref 4.0–10.5)
nRBC: 0 % (ref 0.0–0.2)

## 2021-04-26 LAB — BASIC METABOLIC PANEL
Anion gap: 8 (ref 5–15)
BUN: 16 mg/dL (ref 8–23)
CO2: 28 mmol/L (ref 22–32)
Calcium: 9.9 mg/dL (ref 8.9–10.3)
Chloride: 104 mmol/L (ref 98–111)
Creatinine, Ser: 0.65 mg/dL (ref 0.44–1.00)
GFR, Estimated: >60 mL/min (ref >60)
Glucose, Bld: 105 mg/dL — ABNORMAL HIGH (ref 70–99)
Potassium: 4.9 mmol/L (ref 3.5–5.1)
Sodium: 140 mmol/L (ref 135–145)

## 2021-04-26 LAB — SURGICAL PCR SCREEN
MRSA, PCR: NEGATIVE
Staphylococcus aureus: POSITIVE — AB

## 2021-04-26 NOTE — Progress Notes (Addendum)
Anesthesia Review:  PCP:DR Tracey Harries -LOV 04/01/21.  - with Sharon Seller  ` Cardiologist : Chest x-ray : EKG : 05/20/20  Echo : Stress test: Cardiac Cath :  Activity level: can do a flight of stairs without difficulty  Sleep Study/ CPAP : pt was told not to use bipap machine she has and has not followed upon getting a new one  Fasting Blood Sugar :      / Checks Blood Sugar -- times a day:   Blood Thinner/ Instructions /Last Dose: ASA / Instructions/ Last Dose :   No covid test- ambulatory surgery  Pcr pos for staph routed to MD on 04/27/21.

## 2021-05-04 ENCOUNTER — Telehealth (HOSPITAL_COMMUNITY): Payer: Medicare Other | Admitting: Psychiatry

## 2021-05-10 ENCOUNTER — Encounter (HOSPITAL_COMMUNITY): Payer: Self-pay | Admitting: Psychiatry

## 2021-05-10 ENCOUNTER — Other Ambulatory Visit: Payer: Self-pay

## 2021-05-10 ENCOUNTER — Telehealth (HOSPITAL_BASED_OUTPATIENT_CLINIC_OR_DEPARTMENT_OTHER): Payer: Medicare Other | Admitting: Psychiatry

## 2021-05-10 DIAGNOSIS — F330 Major depressive disorder, recurrent, mild: Secondary | ICD-10-CM | POA: Diagnosis not present

## 2021-05-10 DIAGNOSIS — F411 Generalized anxiety disorder: Secondary | ICD-10-CM | POA: Diagnosis not present

## 2021-05-10 DIAGNOSIS — F5101 Primary insomnia: Secondary | ICD-10-CM | POA: Diagnosis not present

## 2021-05-10 MED ORDER — BUPROPION HCL ER (XL) 150 MG PO TB24: 150.0000 mg | ORAL_TABLET | Freq: Every morning | ORAL | 0 refills | Status: DC

## 2021-05-10 MED ORDER — VENLAFAXINE HCL ER 150 MG PO CP24: 150.0000 mg | ORAL_CAPSULE | Freq: Every day | ORAL | 0 refills | Status: DC

## 2021-05-10 MED ORDER — LAMOTRIGINE 200 MG PO TABS: 200.0000 mg | ORAL_TABLET | Freq: Every day | ORAL | 0 refills | Status: DC

## 2021-05-10 MED ORDER — ZOLPIDEM TARTRATE 5 MG PO TABS: 5.0000 mg | ORAL_TABLET | Freq: Every evening | ORAL | 1 refills | Status: DC | PRN

## 2021-05-10 NOTE — Progress Notes (Signed)
Virtual Visit via Telephone Note  I connected with [name] [name] on 05/10/21 at  2:40 PM EDT by telephone and verified that I am speaking with the correct person using two identifiers.  Location: Patient: Home Provider: Home Office   I discussed the limitations, risks, security and privacy concerns of performing an evaluation and management service by telephone and the availability of in person appointments. I also discussed with the patient that there may be a patient responsible charge related to this service. The patient expressed understanding and agreed to proceed.   History of Present Illness: Patient evaluated by phone session.  She is compliant with medication.  She has upcoming right shoulder surgery in few days.  She admitted some anxiety but also hoping it will help her chronic pain.  There are nights she has difficulty sleeping because of the right shoulder pain.  She had left shoulder surgery 8 months ago that helped her a lot.  Overall patient described her mood is stable.  She is disappointed because her father did not allow to bring dog which she was thinking but now she has to change the plan.  Patient denies any crying spells or any feeling of hopelessness.  She denies any panic attack.  She does take Ambien as needed which helps her sleep.  Her energy level is fair.  Her appetite is okay.    Past Psychiatric History: Reviewed. H/O inpatient in 2010 after brother committed suicide. On meds since 2008.  Took Prozac (manic), Paxil (did not help) and Abilify (headaches and diarrhea).  No h/o suicidal attempt or hallucinations.     Psychiatric Specialty Exam: Physical Exam  Review of Systems  Weight 162 lb (73.5 kg).There is no height or weight on file to calculate BMI.  General Appearance: NA  Eye Contact:  NA  Speech:  Normal Rate  Volume:  Normal  Mood:  Euthymic  Affect:  NA  Thought Process:  Goal Directed  Orientation:  Full (Time, Place, and Person)  Thought Content:   Logical  Suicidal Thoughts:  No  Homicidal Thoughts:  No  Memory:  Immediate;   Good Recent;   Good Remote;   Fair  Judgement:  Intact  Insight:  Present  Psychomotor Activity:  NA  Concentration:  Concentration: Good and Attention Span: Good  Recall:  Good  Fund of Knowledge:  Good  Language:  Good  Akathisia:  No  Handed:  Right  AIMS (if indicated):     Assets:  Communication Skills Desire for Improvement Housing Resilience  ADL's:  Intact  Cognition:  WNL  Sleep:   ok      Assessment and Plan: Major depressive disorder, recurrent.  Anxiety.  Primary insomnia.  Patient is stable on her current medication.  Reassurance given has upcoming right shoulder surgery in few days.  Continue Lamictal 200 mg daily, Effexor and 50 mg daily, Wellbutrin XL 150 mg daily and Ambien 5 mg as needed for insomnia.  Recommended to call us back if she is any question or any concern.  Follow-up in 3 months.  Follow Up Instructions:    I discussed the assessment and treatment plan with the patient. The patient was provided an opportunity to ask questions and all were answered. The patient agreed with the plan and demonstrated an understanding of the instructions.   The patient was advised to call back or seek an in-person evaluation if the symptoms worsen or if the condition fails to improve as anticipated.  I provided  18 minutes of non-face-to-face time during this encounter.   Cleotis Nipper, MD

## 2021-05-12 ENCOUNTER — Ambulatory Visit (HOSPITAL_COMMUNITY): Payer: Medicare Other | Admitting: Certified Registered Nurse Anesthetist

## 2021-05-12 ENCOUNTER — Encounter: Payer: Self-pay | Admitting: *Deleted

## 2021-05-12 ENCOUNTER — Ambulatory Visit (HOSPITAL_COMMUNITY)
Admission: RE | Admit: 2021-05-12 | Discharge: 2021-05-12 | Disposition: A | Discharge status: Home / Self Care | Payer: Medicare Other | Attending: Orthopedic Surgery | Admitting: Orthopedic Surgery

## 2021-05-12 ENCOUNTER — Encounter (HOSPITAL_COMMUNITY)
Admission: RE | Disposition: A | Discharge status: Home / Self Care | Payer: Self-pay | Source: Home / Self Care | Attending: Orthopedic Surgery

## 2021-05-12 ENCOUNTER — Encounter (HOSPITAL_COMMUNITY): Payer: Self-pay | Admitting: Orthopedic Surgery

## 2021-05-12 DIAGNOSIS — Z87891 Personal history of nicotine dependence: Secondary | ICD-10-CM | POA: Insufficient documentation

## 2021-05-12 DIAGNOSIS — Z6831 Body mass index (BMI) 31.0-31.9, adult: Secondary | ICD-10-CM | POA: Diagnosis not present

## 2021-05-12 DIAGNOSIS — M75121 Complete rotator cuff tear or rupture of right shoulder, not specified as traumatic: Secondary | ICD-10-CM | POA: Insufficient documentation

## 2021-05-12 DIAGNOSIS — E669 Obesity, unspecified: Secondary | ICD-10-CM | POA: Insufficient documentation

## 2021-05-12 DIAGNOSIS — M75101 Unspecified rotator cuff tear or rupture of right shoulder, not specified as traumatic: Secondary | ICD-10-CM | POA: Diagnosis present

## 2021-05-12 DIAGNOSIS — Z79899 Other long term (current) drug therapy: Secondary | ICD-10-CM | POA: Diagnosis not present

## 2021-05-12 HISTORY — PX: REVERSE SHOULDER ARTHROPLASTY: SHX5054

## 2021-05-12 SURGERY — ARTHROPLASTY, SHOULDER, TOTAL, REVERSE
Anesthesia: Regional | Site: Shoulder | Laterality: Right

## 2021-05-12 MED ORDER — LIDOCAINE HCL (PF) 2 % IJ SOLN
INTRAMUSCULAR | Status: AC
  Filled 2021-05-12: qty 5

## 2021-05-12 MED ORDER — CYCLOBENZAPRINE HCL 10 MG PO TABS: 10.0000 mg | ORAL_TABLET | Freq: Three times a day (TID) | ORAL | 1 refills | Status: AC | PRN

## 2021-05-12 MED ORDER — OXYCODONE HCL 5 MG/5ML PO SOLN: 5.0000 mg | Freq: Once | ORAL | Status: DC | PRN

## 2021-05-12 MED ORDER — ONDANSETRON HCL 4 MG/2ML IJ SOLN
INTRAMUSCULAR | Status: AC
  Filled 2021-05-12: qty 2

## 2021-05-12 MED ORDER — CHLORHEXIDINE GLUCONATE 0.12 % MT SOLN
15.0000 mL | Freq: Once | OROMUCOSAL | Status: AC
  Administered 2021-05-12: 15 mL via OROMUCOSAL

## 2021-05-12 MED ORDER — LACTATED RINGERS IV BOLUS
250.0000 mL | Freq: Once | INTRAVENOUS | Status: AC
  Administered 2021-05-12: 250 mL via INTRAVENOUS

## 2021-05-12 MED ORDER — DEXAMETHASONE SODIUM PHOSPHATE 10 MG/ML IJ SOLN
INTRAMUSCULAR | Status: AC
  Filled 2021-05-12: qty 1

## 2021-05-12 MED ORDER — HYDROMORPHONE HCL 1 MG/ML IJ SOLN: 0.2500 mg | INTRAMUSCULAR | Status: DC | PRN

## 2021-05-12 MED ORDER — EPHEDRINE SULFATE-NACL 50-0.9 MG/10ML-% IV SOSY
PREFILLED_SYRINGE | INTRAVENOUS | Status: DC | PRN
  Administered 2021-05-12: 5 mg via INTRAVENOUS
  Administered 2021-05-12: 10 mg via INTRAVENOUS
  Administered 2021-05-12: 2.5 mg via INTRAVENOUS
  Administered 2021-05-12: 5 mg via INTRAVENOUS

## 2021-05-12 MED ORDER — MELOXICAM 15 MG PO TABS: 15.0000 mg | ORAL_TABLET | Freq: Every day | ORAL | 0 refills | Status: AC

## 2021-05-12 MED ORDER — LACTATED RINGERS IV BOLUS
500.0000 mL | Freq: Once | INTRAVENOUS | Status: AC
  Administered 2021-05-12: 500 mL via INTRAVENOUS

## 2021-05-12 MED ORDER — ROCURONIUM BROMIDE 10 MG/ML (PF) SYRINGE
PREFILLED_SYRINGE | INTRAVENOUS | Status: AC
  Filled 2021-05-12: qty 10

## 2021-05-12 MED ORDER — ORAL CARE MOUTH RINSE: 15.0000 mL | Freq: Once | OROMUCOSAL | Status: AC

## 2021-05-12 MED ORDER — PROPOFOL 10 MG/ML IV BOLUS
INTRAVENOUS | Status: AC
  Filled 2021-05-12: qty 20

## 2021-05-12 MED ORDER — CEFAZOLIN SODIUM-DEXTROSE 2-4 GM/100ML-% IV SOLN
2.0000 g | INTRAVENOUS | Status: AC
  Administered 2021-05-12: 2 g via INTRAVENOUS
  Filled 2021-05-12: qty 100

## 2021-05-12 MED ORDER — ONDANSETRON HCL 4 MG PO TABS: 4.0000 mg | ORAL_TABLET | Freq: Three times a day (TID) | ORAL | 0 refills | Status: DC | PRN

## 2021-05-12 MED ORDER — OXYCODONE-ACETAMINOPHEN 5-325 MG PO TABS: 1.0000 | ORAL_TABLET | ORAL | 0 refills | Status: DC | PRN

## 2021-05-12 MED ORDER — VANCOMYCIN HCL 1000 MG IV SOLR
INTRAVENOUS | Status: AC
  Filled 2021-05-12: qty 20

## 2021-05-12 MED ORDER — ONDANSETRON HCL 4 MG/2ML IJ SOLN
INTRAMUSCULAR | Status: DC | PRN
  Administered 2021-05-12: 4 mg via INTRAVENOUS

## 2021-05-12 MED ORDER — DEXAMETHASONE SODIUM PHOSPHATE 4 MG/ML IJ SOLN
INTRAMUSCULAR | Status: DC | PRN
  Administered 2021-05-12: 5 mg via INTRAVENOUS

## 2021-05-12 MED ORDER — LACTATED RINGERS IV SOLN
INTRAVENOUS | Status: DC
Start: 2021-05-12 — End: 2021-05-12

## 2021-05-12 MED ORDER — SUGAMMADEX SODIUM 200 MG/2ML IV SOLN
INTRAVENOUS | Status: DC | PRN
  Administered 2021-05-12: 200 mg via INTRAVENOUS

## 2021-05-12 MED ORDER — OXYCODONE HCL 5 MG PO TABS
5.0000 mg | ORAL_TABLET | Freq: Once | ORAL | Status: DC | PRN
Start: 2021-05-12 — End: 2021-05-12

## 2021-05-12 MED ORDER — AMISULPRIDE (ANTIEMETIC) 5 MG/2ML IV SOLN: 10.0000 mg | Freq: Once | INTRAVENOUS | Status: DC | PRN

## 2021-05-12 MED ORDER — PROMETHAZINE HCL 25 MG/ML IJ SOLN: 6.2500 mg | INTRAMUSCULAR | Status: DC | PRN

## 2021-05-12 MED ORDER — TRANEXAMIC ACID-NACL 1000-0.7 MG/100ML-% IV SOLN
1000.0000 mg | INTRAVENOUS | Status: AC
  Administered 2021-05-12: 1000 mg via INTRAVENOUS
  Filled 2021-05-12: qty 100

## 2021-05-12 MED ORDER — ROCURONIUM BROMIDE 10 MG/ML (PF) SYRINGE
PREFILLED_SYRINGE | INTRAVENOUS | Status: DC | PRN
  Administered 2021-05-12: 50 mg via INTRAVENOUS
  Administered 2021-05-12: 10 mg via INTRAVENOUS

## 2021-05-12 MED ORDER — EPHEDRINE 5 MG/ML INJ
INTRAVENOUS | Status: AC
  Filled 2021-05-12: qty 5

## 2021-05-12 MED ORDER — FENTANYL CITRATE PF 50 MCG/ML IJ SOSY
50.0000 ug | PREFILLED_SYRINGE | Freq: Once | INTRAMUSCULAR | Status: AC
  Administered 2021-05-12: 100 ug via INTRAVENOUS
  Filled 2021-05-12: qty 2

## 2021-05-12 MED ORDER — VANCOMYCIN HCL 1000 MG IV SOLR
INTRAVENOUS | Status: DC | PRN
  Administered 2021-05-12: 1000 mg via TOPICAL

## 2021-05-12 MED ORDER — BUPIVACAINE HCL (PF) 0.5 % IJ SOLN
INTRAMUSCULAR | Status: DC | PRN
  Administered 2021-05-12: 20 mL via PERINEURAL

## 2021-05-12 MED ORDER — LIDOCAINE 2% (20 MG/ML) 5 ML SYRINGE
INTRAMUSCULAR | Status: DC | PRN
  Administered 2021-05-12: 60 mg via INTRAVENOUS

## 2021-05-12 MED ORDER — MEPERIDINE HCL 50 MG/ML IJ SOLN: 6.2500 mg | INTRAMUSCULAR | Status: DC | PRN

## 2021-05-12 MED ORDER — TRANEXAMIC ACID 1000 MG/10ML IV SOLN: 1000.0000 mg | INTRAVENOUS | Status: DC

## 2021-05-12 MED ORDER — PHENYLEPHRINE 40 MCG/ML (10ML) SYRINGE FOR IV PUSH (FOR BLOOD PRESSURE SUPPORT)
PREFILLED_SYRINGE | INTRAVENOUS | Status: DC | PRN
  Administered 2021-05-12: 80 ug via INTRAVENOUS

## 2021-05-12 MED ORDER — BUPIVACAINE LIPOSOME 1.3 % IJ SUSP
INTRAMUSCULAR | Status: DC | PRN
  Administered 2021-05-12: 10 mL via PERINEURAL

## 2021-05-12 MED ORDER — STERILE WATER FOR IRRIGATION IR SOLN
Status: DC | PRN
  Administered 2021-05-12: 2000 mL

## 2021-05-12 MED ORDER — 0.9 % SODIUM CHLORIDE (POUR BTL) OPTIME
TOPICAL | Status: DC | PRN
  Administered 2021-05-12: 1000 mL

## 2021-05-12 MED ORDER — PROPOFOL 10 MG/ML IV BOLUS
INTRAVENOUS | Status: DC | PRN
  Administered 2021-05-12: 110 mg via INTRAVENOUS

## 2021-05-12 MED ORDER — MIDAZOLAM HCL 2 MG/2ML IJ SOLN
1.0000 mg | Freq: Once | INTRAMUSCULAR | Status: AC
  Administered 2021-05-12: 2 mg via INTRAVENOUS
  Filled 2021-05-12: qty 2

## 2021-05-12 SURGICAL SUPPLY — 72 items
ADH SKN CLS APL DERMABOND .7 (GAUZE/BANDAGES/DRESSINGS) ×1
AID PSTN UNV HD RSTRNT DISP (MISCELLANEOUS) ×1
BAG COUNTER SPONGE SURGICOUNT (BAG) IMPLANT
BAG SPEC THK2 15X12 ZIP CLS (MISCELLANEOUS) ×1
BAG SPNG CNTER NS LX DISP (BAG)
BAG ZIPLOCK 12X15 (MISCELLANEOUS) ×2 IMPLANT
BLADE SAW SGTL 83.5X18.5 (BLADE) ×2 IMPLANT
BNDG COHESIVE 4X5 TAN ST LF (GAUZE/BANDAGES/DRESSINGS) ×2 IMPLANT
BSPLAT GLND +2X24 MDLR (Joint) ×1 IMPLANT
COOLER ICEMAN CLASSIC (MISCELLANEOUS) ×2 IMPLANT
COVER BACK TABLE 60X90IN (DRAPES) ×2 IMPLANT
COVER SURGICAL LIGHT HANDLE (MISCELLANEOUS) ×2 IMPLANT
CUP SUT UNIV REVERS 36 NEUTRAL (Cup) ×1 IMPLANT
DERMABOND ADVANCED (GAUZE/BANDAGES/DRESSINGS) ×1
DERMABOND ADVANCED .7 DNX12 (GAUZE/BANDAGES/DRESSINGS) ×1 IMPLANT
DRAPE INCISE IOBAN 66X45 STRL (DRAPES) IMPLANT
DRAPE ORTHO SPLIT 77X108 STRL (DRAPES) ×4
DRAPE SHEET LG 3/4 BI-LAMINATE (DRAPES) ×2 IMPLANT
DRAPE SURG 17X11 SM STRL (DRAPES) ×2 IMPLANT
DRAPE SURG ORHT 6 SPLT 77X108 (DRAPES) ×2 IMPLANT
DRAPE TOP 10253 STERILE (DRAPES) ×2 IMPLANT
DRAPE U-SHAPE 47X51 STRL (DRAPES) ×2 IMPLANT
DRESSING AQUACEL AG SP 3.5X6 (GAUZE/BANDAGES/DRESSINGS) ×1 IMPLANT
DRSG AQUACEL AG ADV 3.5X10 (GAUZE/BANDAGES/DRESSINGS) IMPLANT
DRSG AQUACEL AG SP 3.5X6 (GAUZE/BANDAGES/DRESSINGS) ×2
DURAPREP 26ML APPLICATOR (WOUND CARE) ×2 IMPLANT
ELECT BLADE TIP CTD 4 INCH (ELECTRODE) ×2 IMPLANT
ELECT REM PT RETURN 15FT ADLT (MISCELLANEOUS) ×2 IMPLANT
FACESHIELD WRAPAROUND (MASK) ×8 IMPLANT
FACESHIELD WRAPAROUND OR TEAM (MASK) ×4 IMPLANT
GLENOID UNI REV MOD 24 +2 LAT (Joint) ×1 IMPLANT
GLENOSPHERE 36 +4 LAT/24 (Joint) ×1 IMPLANT
GLOVE SRG 8 PF TXTR STRL LF DI (GLOVE) ×1 IMPLANT
GLOVE SURG ENC MOIS LTX SZ7 (GLOVE) ×2 IMPLANT
GLOVE SURG ENC MOIS LTX SZ7.5 (GLOVE) ×2 IMPLANT
GLOVE SURG UNDER POLY LF SZ7 (GLOVE) ×2 IMPLANT
GLOVE SURG UNDER POLY LF SZ8 (GLOVE) ×2
GOWN STRL REUS W/TWL LRG LVL3 (GOWN DISPOSABLE) ×4 IMPLANT
INSERT HUMERAL 36 +6 (Shoulder) ×1 IMPLANT
KIT BASIN OR (CUSTOM PROCEDURE TRAY) ×2 IMPLANT
KIT TURNOVER KIT A (KITS) ×2 IMPLANT
MANIFOLD NEPTUNE II (INSTRUMENTS) ×2 IMPLANT
NDL TAPERED W/ NITINOL LOOP (MISCELLANEOUS) ×1 IMPLANT
NEEDLE TAPERED W/ NITINOL LOOP (MISCELLANEOUS) ×2 IMPLANT
NS IRRIG 1000ML POUR BTL (IV SOLUTION) ×2 IMPLANT
PACK SHOULDER (CUSTOM PROCEDURE TRAY) ×2 IMPLANT
PAD ARMBOARD 7.5X6 YLW CONV (MISCELLANEOUS) ×2 IMPLANT
PAD COLD SHLDR WRAP-ON (PAD) ×2 IMPLANT
PIN NITINOL TARGETER 2.8 (PIN) IMPLANT
PIN SET MODULAR GLENOID SYSTEM (PIN) IMPLANT
RESTRAINT HEAD UNIVERSAL NS (MISCELLANEOUS) ×2 IMPLANT
SCREW CENTRAL MODULAR 25 (Screw) ×1 IMPLANT
SCREW PERI LOCK 5.5X24 (Screw) ×1 IMPLANT
SCREW PERI LOCK 5.5X36 (Screw) ×1 IMPLANT
SCREW PERIPHERAL 5.5X20 LOCK (Screw) ×2 IMPLANT
SLING ARM FOAM STRAP LRG (SOFTGOODS) IMPLANT
SLING ARM FOAM STRAP MED (SOFTGOODS) ×1 IMPLANT
SPONGE T-LAP 18X18 ~~LOC~~+RFID (SPONGE) IMPLANT
STEM HUMERAL UNI REVERS SZ6 (Stem) ×1 IMPLANT
SUCTION FRAZIER HANDLE 12FR (TUBING) ×2
SUCTION TUBE FRAZIER 12FR DISP (TUBING) ×1 IMPLANT
SUT FIBERWIRE #2 38 T-5 BLUE (SUTURE)
SUT MNCRL AB 3-0 PS2 18 (SUTURE) ×2 IMPLANT
SUT MON AB 2-0 CT1 36 (SUTURE) ×2 IMPLANT
SUT VIC AB 1 CT1 36 (SUTURE) ×2 IMPLANT
SUTURE FIBERWR #2 38 T-5 BLUE (SUTURE) IMPLANT
SUTURE TAPE 1.3 40 TPR END (SUTURE) ×2 IMPLANT
SUTURETAPE 1.3 40 TPR END (SUTURE) ×4
TOWEL OR 17X26 10 PK STRL BLUE (TOWEL DISPOSABLE) ×2 IMPLANT
TOWEL OR NON WOVEN STRL DISP B (DISPOSABLE) ×2 IMPLANT
WATER STERILE IRR 1000ML POUR (IV SOLUTION) ×4 IMPLANT
YANKAUER SUCT BULB TIP 10FT TU (MISCELLANEOUS) ×2 IMPLANT

## 2021-05-12 NOTE — H&P (Signed)
[name] [name]    Chief Complaint: Right shoulder rotator cuff tear arthropathy HPI: The patient is a 65 y.o. female with chronic and progressively increasing right shoulder pain related to severe rotator cuff tear arthropathy.  Due to her increasing functional mutations and failure to respond to prolonged attempts at conservative management, she is brought to the operating room at this time for planned right shoulder reverse arthroplasty.  Past Medical History:  Diagnosis Date   Anxiety    Deafness    Wears Bilat hearing aids since Childhood   Environmental and seasonal allergies    Glaucoma, both eyes    Hyperlipidemia    Major depressive disorder    Mild cognitive impairment, so stated 07/16/2013   Patient subjectively complains of word finding and memory deficits. MOCA 30-30 on 07-16-13 , paraphrasic errors- uses words such as  closet for garage    OA (osteoarthritis)    "all over"   Obstructive sleep apnea treated with BiPAP    Right arm weakness    upper due to shoulder issues   Wears glasses    Wears hearing aid in both ears     Past Surgical History:  Procedure Laterality Date   ABDOMINAL HERNIA REPAIR     ABDOMINAL HYSTERECTOMY     BILATERAL CATARACT SURGERY      FRACTURE SURGERY Right    Foot   JOINT REPLACEMENT Bilateral    Knees   JOINT REPLACEMENT Bilateral 2020   Hips   KNEE ARTHROSCOPY W/ SYNOVECTOMY Left 07-20-2010  dr Lequita Halt  [facility]   LAPAROSCOPIC VAGINAL HYSTERECTOMY WITH SALPINGO OOPHORECTOMY Bilateral 09-25-2005   dr Renaldo Fiddler  [facility]   REVERSE SHOULDER ARTHROPLASTY Left 05/20/2020   Procedure: REVERSE SHOULDER ARTHROPLASTY;  Surgeon: Ernest Mallick, MD;  Location: [facility] OR;  Service: Orthopedics;  Laterality: Left;    RIGHT ANKLE TENDON REPAIR  12-21-2008   dr Simonne Come  [facility]   TOTAL HIP ARTHROPLASTY Left 09/24/2017   Procedure: LEFT TOTAL HIP ARTHROPLASTY ANTERIOR APPROACH;  Surgeon: Ollen Gross, MD;  Location: [facility] ORS;  Service: Orthopedics;   Laterality: Left;   TOTAL HIP ARTHROPLASTY Right 12/19/2017   Procedure: RIGHT TOTAL HIP ARTHROPLASTY ANTERIOR APPROACH;  Surgeon: Ollen Gross, MD;  Location: [facility] ORS;  Service: Orthopedics;  Laterality: Right;   TOTAL KNEE ARTHROPLASTY Left 11/21/2013   Procedure: LEFT TOTAL KNEE  ARTHROPLASTY REVISION;  Surgeon: Loanne Drilling, MD;  Location: [facility] ORS;  Service: Orthopedics;  Laterality: Left;   TOTAL KNEE ARTHROPLASTY Bilateral 06-11-2006   dr Lequita Halt  [facility]    Family History  Problem Relation Age of Onset   Depression Father    Depression Paternal Grandmother    Depression Mother    Alcohol abuse Mother    Depression Brother    Alcohol abuse Brother     Social History:  reports that she quit smoking about 32 years ago. Her smoking use included cigarettes. She has never used smokeless tobacco. She reports current alcohol use. She reports that she does not use drugs.   Medications Prior to Admission  Medication Sig Dispense Refill   buPROPion (WELLBUTRIN XL) 150 MG 24 hr tablet Take 1 tablet (150 mg total) by mouth every morning. 90 tablet 0   cholecalciferol (VITAMIN D3) 25 MCG (1000 UNIT) tablet Take 1,000 Units by mouth daily.     ezetimibe (ZETIA) 10 MG tablet Take 1 tablet by mouth daily.     lamoTRIgine (LAMICTAL) 200 MG tablet Take 1 tablet (200 mg total)  by mouth daily. 90 tablet 0   latanoprost (XALATAN) 0.005 % ophthalmic solution Place 1 drop into both eyes at bedtime.     Misc Natural Products (NEURIVA PO) Take 1 capsule by mouth daily.     timolol (TIMOPTIC) 0.5 % ophthalmic solution Place 1 drop into both eyes daily.   3   venlafaxine XR (EFFEXOR-XR) 150 MG 24 hr capsule Take 1 capsule (150 mg total) by mouth daily with breakfast. 90 capsule 0   zolpidem (AMBIEN) 5 MG tablet Take 1 tablet (5 mg total) by mouth at bedtime as needed for sleep. 15 tablet 1     Physical Exam: Right shoulder demonstrates painful and guarded motion as noted at recent office visits.  She  remains neurovascular intact in the right upper extremity.  Plain radiographs confirm changes consistent with chronic rotator cuff tear arthropathy including a high riding humeral head and complete loss of joint space.  Vitals  Temp:  [98.6 F (37 C)] 98.6 F (37 C) (10/06 0828) Pulse Rate:  [55-66] 55 (10/06 0915) Resp:  [6-16] 9 (10/06 0915) BP: (124-140)/(78-104) 124/80 (10/06 0910) SpO2:  [97 %-100 %] 97 % (10/06 0915) Weight:  [73.5 kg] 73.5 kg (10/06 0829)  Assessment/Plan  Impression: Right shoulder rotator cuff tear arthropathy  Plan of Action: Procedure(s): REVERSE SHOULDER ARTHROPLASTY  Jame Seelig M Drago Hammonds 05/12/2021, 9:34 AM Contact # [phone]

## 2021-05-12 NOTE — Evaluation (Signed)
Occupational Therapy Evaluation Patient Details Name: [name] [name] MRN: [id number] DOB: [birth year] Today's Date: 05/12/2021   History of Present Illness Patient s/p right rTSA   Clinical Impression   [name] [name] is a 65 year old woman s/p shoulder replacement without functional use of right dominant upper extremity secondary to effects of surgery and interscalene block and shoulder precautions. Therapist provided education and instruction to patient in regards to exercises, precautions, positioning, donning upper extremity clothing and bathing while maintaining shoulder precautions, ice and edema management and donning/doffing sling. Patient verbalized understanding and demonstrated as needed. Patient needed assistance to donn shirt and sling due to effects of block but otherwise functional. Patient has assistance of father at home if needed.  Patient to follow up with MD for further therapy needs.        Recommendations for follow up therapy are one component of a multi-disciplinary discharge planning process, led by the attending physician.  Recommendations may be updated based on patient status, additional functional criteria and insurance authorization.   Follow Up Recommendations  Follow surgeon's recommendation for DC plan and follow-up therapies    Equipment Recommendations  None recommended by OT    Recommendations for Other Services       Precautions / Restrictions Precautions Precautions: Shoulder Type of Shoulder Precautions: If sitting in controlled environment, ok to come out of sling to give neck a break. Please sleep in it to protect until follow up in office.     OK to use operative arm for feeding, hygiene and ADLs.   Ok to instruct Pendulums and lap slides as exercises. Ok to use operative arm within the following parameters for ADL purposes     New ROM (4/69)   Ok for PROM, AAROM, AROM within pain tolerance and within the following ROM   ER 20   ABD 45   FE  60 Shoulder Interventions: Shoulder sling/immobilizer;Off for dressing/bathing/exercises Precaution Booklet Issued:  (handouts given) Required Braces or Orthoses: Sling Restrictions Weight Bearing Restrictions: Yes RUE Weight Bearing: Non weight bearing      Mobility Bed Mobility                    Transfers                 General transfer comment: WFL    Balance Overall balance assessment: No apparent balance deficits (not formally assessed)                                         ADL either performed or assessed with clinical judgement   ADL                                               Vision Patient Visual Report: No change from baseline       Perception     Praxis      Pertinent Vitals/Pain Pain Assessment: No/denies pain     Hand Dominance Right   Extremity/Trunk Assessment Upper Extremity Assessment Upper Extremity Assessment: RUE deficits/detail RUE Deficits / Details: Impaired ROM secondary to effects of interscalene block and shoulder precautions   Lower Extremity Assessment Lower Extremity Assessment: Overall WFL for tasks assessed   Cervical / Trunk Assessment Cervical / Trunk Assessment: Normal  Communication Communication Communication: HOH   Cognition Arousal/Alertness: Awake/alert Behavior During Therapy: WFL for tasks assessed/performed Overall Cognitive Status: Within Functional Limits for tasks assessed                                     General Comments       Exercises     Shoulder Instructions Shoulder Instructions Donning/doffing shirt without moving shoulder: Patient able to independently direct caregiver Method for sponge bathing under operated UE: Independent Donning/doffing sling/immobilizer: Independent Correct positioning of sling/immobilizer: Independent Pendulum exercises (written home exercise program): Independent ROM for elbow, wrist and digits  of operated UE: Independent Sling wearing schedule (on at all times/off for ADL's): Independent Proper positioning of operated UE when showering: Independent Dressing change: Independent Positioning of UE while sleeping: Independent    Home Living Family/patient expects to be discharged to:: Private residence Living Arrangements: Parent Available Help at Discharge: Family;Available 24 hours/day Type of Home: House             Bathroom Shower/Tub: Chief Strategy Officer: Standard                Prior Functioning/Environment Level of Independence: Independent                 OT Problem List: Decreased strength;Decreased range of motion;Decreased knowledge of precautions;Pain;Impaired UE functional use      OT Treatment/Interventions:      OT Goals(Current goals can be found in the care plan section) Acute Rehab OT Goals OT Goal Formulation: Patient unable to participate in goal setting  OT Frequency:     Barriers to D/C:            Co-evaluation              AM-PAC OT "6 Clicks" Daily Activity     Outcome Measure Help from another person eating meals?: A Little Help from another person taking care of personal grooming?: None Help from another person toileting, which includes using toliet, bedpan, or urinal?: None Help from another person bathing (including washing, rinsing, drying)?: None Help from another person to put on and taking off regular upper body clothing?: A Little Help from another person to put on and taking off regular lower body clothing?: None 6 Click Score: 22   End of Session Nurse Communication: Other (comment) (OT education complete)  Activity Tolerance: Patient tolerated treatment well Patient left: in chair  OT Visit Diagnosis: Pain                Time: 9622-2979 OT Time Calculation (min): 22 min Charges:  OT General Charges $OT Visit: 1 Visit OT Evaluation $OT Eval Low Complexity: 1 Low  Corky Blumstein,  OTR/L Acute Care Rehab Services  Office [phone] Pager: [phone]   Kelli Churn 05/12/2021, 1:47 PM

## 2021-05-12 NOTE — Anesthesia Postprocedure Evaluation (Signed)
Anesthesia Post Note  Patient: [name] [name]  Procedure(s) Performed: REVERSE SHOULDER ARTHROPLASTY (Right: Shoulder)     Patient location during evaluation: PACU Anesthesia Type: Regional and General Level of consciousness: awake and alert Pain management: pain level controlled Vital Signs Assessment: post-procedure vital signs reviewed and stable Respiratory status: spontaneous breathing, nonlabored ventilation, respiratory function stable and patient connected to nasal cannula oxygen Cardiovascular status: blood pressure returned to baseline and stable Postop Assessment: no apparent nausea or vomiting Anesthetic complications: no   No notable events documented.  Last Vitals:  Vitals:   05/12/21 1230 05/12/21 1250  BP: 134/81 (!) 130/93  Pulse: 60 63  Resp: 14 16  Temp:  36.9 C  SpO2: 93% 93%    Last Pain:  Vitals:   05/12/21 1250  TempSrc: Oral  PainSc: 0-No pain                 Earl Lites P Odessa Morren

## 2021-05-12 NOTE — Discharge Instructions (Signed)
 Kevin M. Supple, M.D., F.A.A.O.S. Orthopaedic Surgery Specializing in Arthroscopic and Reconstructive Surgery of the Shoulder [phone] [address] - [place], [place] [address] - Fax [phone]   POST-OP TOTAL SHOULDER REPLACEMENT INSTRUCTIONS  1. Follow up in the office for your first post-op appointment 10-14 days from the date of your surgery. If you do not already have a scheduled appointment, our office will contact you to schedule.  2. The bandage over your incision is waterproof. You may begin showering with this dressing on. You may leave this dressing on until first follow up appointment within 2 weeks. We prefer you leave this dressing in place until follow up however after 5-7 days if you are having itching or skin irritation and would like to remove it you may do so. Go slow and tug at the borders gently to break the bond the dressing has with the skin. At this point if there is no drainage it is okay to go without a bandage or you may cover it with a light guaze and tape. You can also expect significant bruising around your shoulder that will drift down your arm and into your chest wall. This is very normal and should resolve over several days.   3. Wear your sling/immobilizer at all times except to perform the exercises below or to occasionally let your arm dangle by your side to stretch your elbow. You also need to sleep in your sling immobilizer until instructed otherwise. It is ok to remove your sling if you are sitting in a controlled environment and allow your arm to rest in a position of comfort by your side or on your lap with pillows to give your neck and skin a break from the sling. You may remove it to allow arm to dangle by side to shower. If you are up walking around and when you go to sleep at night you need to wear it.  4. Range of motion to your elbow, wrist, and hand are encouraged 3-5 times daily. Exercise to your hand and fingers helps to reduce  swelling you may experience.   5. Prescriptions for a pain medication and a muscle relaxant are provided for you. It is recommended that if you are experiencing pain that you pain medication alone is not controlling, add the muscle relaxant along with the pain medication which can give additional pain relief. The first 1-2 days is generally the most severe of your pain and then should gradually decrease. As your pain lessens it is recommended that you decrease your use of the pain medications to an "as needed basis'" only and to always comply with the recommended dosages of the pain medications.  6. Pain medications can produce constipation along with their use. If you experience this, the use of an over the counter stool softener or laxative daily is recommended.   7. For additional questions or concerns, please do not hesitate to call the office. If after hours there is an answering service to forward your concerns to the physician on call.  8.Pain control following an exparel block  To help control your post-operative pain you received a nerve block  performed with Exparel which is a long acting anesthetic (numbing agent) which can provide pain relief and sensations of numbness (and relief of pain) in the operative shoulder and arm for up to 3 days. Sometimes it provides mixed relief, meaning you may still have numbness in certain areas of the arm but can still be able to   move  parts of that arm, hand, and fingers. We recommend that your prescribed pain medications  be used as needed. We do not feel it is necessary to "pre medicate" and "stay ahead" of pain.  Taking narcotic pain medications when you are not having any pain can lead to unnecessary and potentially dangerous side effects.    9. Use the ice machine as much as possible in the first 5-7 days from surgery, then you can wean its use to as needed. The ice typically needs to be replaced every 6 hours, instead of ice you can actually freeze  water bottles to put in the cooler and then fill water around them to avoid having to purchase ice. You can have spare water bottles freezing to allow you to rotate them once they have melted. Try to have a thin shirt or light cloth or towel under the ice wrap to protect your skin.   FOR ADDITIONAL INFO ON ICE MACHINE AND INSTRUCTIONS GO TO THE WEBSITE AT  https://[email or web address].com/products/donjoy/[email or web address]  10.  We recommend that you avoid any dental work or cleaning in the first 3 months following your joint replacement. This is to help minimize the possibility of infection from the bacteria in your mouth that enters your bloodstream during dental work. We also recommend that you take an antibiotic prior to your dental work for the first year after your shoulder replacement to further help reduce that risk. Please simply contact our office for antibiotics to be sent to your pharmacy prior to dental work.  11. Dental Antibiotics:  In most cases prophylactic antibiotics for Dental procdeures after total joint surgery are not necessary.  Exceptions are as follows:  1. History of prior total joint infection  2. Severely immunocompromised (Organ Transplant, cancer chemotherapy, Rheumatoid biologic meds such as Humera)  3. Poorly controlled diabetes (A1C &gt; 8.0, blood glucose over 200)  If you have one of these conditions, contact your surgeon for an antibiotic prescription, prior to your dental procedure.   POST-OP EXERCISES  Pendulum Exercises  Perform pendulum exercises while standing and bending at the waist. Support your uninvolved arm on a table or chair and allow your operated arm to hang freely. Make sure to do these exercises passively - not using you shoulder muscles. These exercises can be performed once your nerve block effects have worn off.  Repeat 20 times. Do 3 sessions per day.     

## 2021-05-12 NOTE — Progress Notes (Signed)
Time out completed.   Assisted Dr. Hyacinth Meeker with right, ultrasound guided, interscalene  block. Side rails up, monitors on throughout procedure. See vital signs in flow sheet. Tolerated Procedure well.

## 2021-05-12 NOTE — Anesthesia Preprocedure Evaluation (Signed)
Anesthesia Evaluation  Patient identified by MRN, date of birth, ID band Patient awake    Reviewed: Allergy & Precautions, NPO status , Patient's Chart, lab work & pertinent test results  Airway Mallampati: II  TM Distance: >3 FB Neck ROM: Limited   Comment: Decreased neck range of motion Dental no notable dental hx.    Pulmonary sleep apnea (bipap) , former smoker,    Pulmonary exam normal breath sounds clear to auscultation       Cardiovascular Exercise Tolerance: Good Normal cardiovascular exam Rhythm:Regular Rate:Normal     Neuro/Psych PSYCHIATRIC DISORDERS Anxiety Depression Hearing impairment    GI/Hepatic negative GI ROS, Neg liver ROS,   Endo/Other  obesity  Renal/GU negative Renal ROS  negative genitourinary   Musculoskeletal  (+) Arthritis ,   Abdominal (+) + obese,   Peds negative pediatric ROS (+)  Hematology negative hematology ROS (+)   Anesthesia Other Findings   Reproductive/Obstetrics negative OB ROS                             Anesthesia Physical  Anesthesia Plan  ASA: III  Anesthesia Plan: Regional and General   Post-op Pain Management: GA combined w/ Regional for post-op pain   Induction: Intravenous  PONV Risk Score and Plan: 3 and Ondansetron, Midazolam and Dexamethasone  Airway Management Planned: Oral ETT  Additional Equipment:   Intra-op Plan:   Post-operative Plan: Extubation in OR  Informed Consent: I have reviewed the patients History and Physical, chart, labs and discussed the procedure including the risks, benefits and alternatives for the proposed anesthesia with the patient or authorized representative who has indicated his/her understanding and acceptance.     Dental advisory given  Plan Discussed with: CRNA and Anesthesiologist  Anesthesia Plan Comments: (Interscalene block with Exparel + GETA. )        Anesthesia Quick  Evaluation

## 2021-05-12 NOTE — Op Note (Signed)
05/12/2021  11:53 AM  PATIENT:   [name] [name]  65 y.o. female  PRE-OPERATIVE DIAGNOSIS:  Right shoulder rotator cuff tear arthropathy  POST-OPERATIVE DIAGNOSIS: Same  PROCEDURE: Right shoulder reverse arthroplasty utilizing a press-fit size 6 Arthrex stem with a neutral metaphysis, +6 polyethylene insert, 36/+4 glenosphere on a small/+2 baseplate  SURGEON:  Eldine Rencher, Vania Rea M.D.  ASSISTANTS: Ralene Bathe, PA-C  ANESTHESIA:   General endotracheal and interscalene block with Exparel  EBL: Approximately 100 cc  SPECIMEN: None  Drains: None   PATIENT DISPOSITION:  PACU - hemodynamically stable.    PLAN OF CARE: Discharge to home after PACU  Brief history:  Patient is a 65 year old female with chronic right shoulder pain related to severe rotator cuff tear arthropathy.  She had a previous left shoulder reverse arthroplasty for rotator cuff tear arthropathy and has done well.  She is now brought to the operating room for planned right shoulder reverse arthroplasty.  Preoperatively, I counseled the patient regarding treatment options and risks versus benefits thereof.  Possible surgical complications were all reviewed including potential for bleeding, infection, neurovascular injury, persistent pain, loss of motion, anesthetic complication, failure of the implant, and possible need for additional surgery. They understand and accept and agrees with our planned procedure.   Procedure in detail:  After undergoing routine preop evaluation the patient received prophylactic antibiotics and an interscalene block with Exparel was established in the holding area by the anesthesia department.  Patient was subsequently brought to the operating room placed supine on the operating table where she underwent the smooth induction of the general endotracheal anesthesia.  Placed into the beachchair position and appropriately padded and protected.  The right shoulder girdle region was sterilely  prepped and draped in standard fashion.  Timeout was called.  A deltopectoral Roache to the right shoulder is made through an approximately 8 cm incision.  Skin flaps were elevated dissection carried deeply and electrocautery was used for hemostasis.  The deltopectoral interval was then developed from proximal to distal with the vein taken laterally and the upper border the pectoralis major was tenotomized for exposure.  Adhesions were divided beneath the deltoid.  We found that she had a medial dislocation long head biceps tendon with avulsion of the upper two thirds of the subscapularis.  I did go ahead and tenodesis the long head biceps tendon at the level of the pectoralis major tendon with the proximal segment excised.  The lower third of the subscapularis was then separated from the lesser tuberosity and tagged with a pair of suture tape sutures.  Capsular attachments were then divided from the anterior and infra margins of the humeral neck and the humeral head was delivered through the wound we did identify complete loss of rotator cuff attachment superiorly and posterior superior of the humeral head.  An extra medullary guide was then used to outline a proposed humeral head resection which was performed with an oscillating saw at approximately 20 degrees retroversion.  A metal cap was then placed over the cut proximal humeral surface after the peripheral osteophytes were removed with a rondure.  We then exposed the glenoid with appropriate retractors and performed a circumferential labral resection gaining complete visualization of the entire circumference of the glenoid.  A guidepin was then directed into the center of the glenoid with an approximately 5 degree inferior tilt.  The gland was then reamed with the central followed by peripheral reamer to a stable subchondral bony bed and all debris was then  carefully removed.  Glenoid preparation completed with the central drill and tapped.  The baseplate was  assembled with a 25 mm lag screw and it was inserted after vancomycin powder was applied to the threads of the lag screw and it achieved excellent purchase and fixation.  The peripheral locking screws were all then placed using standard technique and a 36/+4 glenosphere was then impacted onto the baseplate and the central locking screw was placed.  We then returned our attention to the proximal humerus where the canal was opened we broached up to a size 6 at approximate 20 degrees retroversion with excellent purchase and fixation.  A neutral metaphyseal reaming guide was then used to prepare the metaphysis.  At this point a trial reduction was performed which showed good motion good stability good soft tissue balance.  A trial implant was then removed and the final implant was assembled.  Vancomycin powder spread liberally to the humeral canal and the final implant was then seated internally impacted with excellent fit and fixation.  At this point a series of trial reduction showed excellent fit with a +6 polyethylene insert.  The trial polyp was removed the final polyp was then impacted after the implant was cleaned and dried.  A final reduction again showed good motion good stability and excellent soft tissue balance.  At this point we confirmed good elasticity of the inferior portion of the subscapularis which we had previously tagged.  This was then repaired back to the eyelets on the collar the implant in the arm then easily could achieve 45 degrees of external rotation without excessive tension on the subscap repair.  Wound was then terminally irrigated.  Hemostasis was obtained.  The balance of the vancomycin powder was then sprayed liberally throughout the deep soft tissue planes.  The deltopectoral interval was reapproximated with a series of figure-of-eight number Vicryl sutures.  2-0 Monocryl used for the subcu layer and intracuticular 3-0 Monocryl for the skin followed by Dermabond and Aquacel dressing.   The right arm was then placed into a sling and the patient was awakened, extubated, and taken to the recovery room in stable condition.  Ralene Bathe, PA-C was utilized as an Geophysicist/field seismologist throughout this case, essential for help with positioning the patient, positioning extremity, tissue manipulation, implantation of the prosthesis, suture management, wound closure, and intraoperative decision-making.  Senaida Lange MD   Contact # [phone]

## 2021-05-12 NOTE — Anesthesia Procedure Notes (Signed)
Anesthesia Regional Block: Interscalene brachial plexus block   Pre-Anesthetic Checklist: , timeout performed,  Correct Patient, Correct Site, Correct Laterality,  Correct Procedure, Correct Position, site marked,  Risks and benefits discussed,  Surgical consent,  Pre-op evaluation,  At surgeon's request and post-op pain management  Laterality: Right  Prep: chloraprep       Needles:  Injection technique: Single-shot  Needle Type: Stimiplex     Needle Length: 9cm  Needle Gauge: 21     Additional Needles:   Procedures:,,,, ultrasound used (permanent image in chart),,    Narrative:  Start time: 05/12/2021 9:08 AM End time: 05/12/2021 9:13 AM Injection made incrementally with aspirations every 5 mL.  Performed by: Personally  Anesthesiologist: Lowella Curb, MD

## 2021-05-12 NOTE — Anesthesia Procedure Notes (Signed)
Procedure Name: Intubation Date/Time: 05/12/2021 10:20 AM Performed by: [name] [place], CRNA Pre-anesthesia Checklist: Patient identified, Emergency Drugs available, Suction available and Patient being monitored Patient Re-evaluated:Patient Re-evaluated prior to induction Oxygen Delivery Method: Circle system utilized Preoxygenation: Pre-oxygenation with 100% oxygen Induction Type: IV induction Ventilation: Mask ventilation without difficulty Laryngoscope Size: 2 and Miller Grade View: Grade I Tube type: Oral Tube size: 7.0 mm Number of attempts: 1 Airway Equipment and Method: Stylet Placement Confirmation: ETT inserted through vocal cords under direct vision, positive ETCO2 and breath sounds checked- equal and bilateral Secured at: 21 cm Tube secured with: Tape Dental Injury: Teeth and Oropharynx as per pre-operative assessment

## 2021-05-12 NOTE — Congregational Nurse Program (Signed)
Prayers shared with patient while awaiting should replacement surgery.  Thankful for the visit.  Did check on elderly father that is in the waiting room. No other needs at this time.

## 2021-05-12 NOTE — Transfer of Care (Signed)
Immediate Anesthesia Transfer of Care Note  Patient: [name] [name]  Procedure(s) Performed: REVERSE SHOULDER ARTHROPLASTY (Right: Shoulder)  Patient Location: PACU  Anesthesia Type:GA combined with regional for post-op pain  Level of Consciousness: awake and patient cooperative  Airway & Oxygen Therapy: Patient Spontanous Breathing and Patient connected to face mask  Post-op Assessment: Report given to RN and Post -op Vital signs reviewed and stable  Post vital signs: Reviewed and stable  Last Vitals:  Vitals Value Taken Time  BP    Temp    Pulse 70 05/12/21 1158  Resp 21 05/12/21 1158  SpO2 95 % 05/12/21 1158  Vitals shown include unvalidated device data.  Last Pain:  Vitals:   05/12/21 0935  TempSrc:   PainSc: 0-No pain         Complications: No notable events documented.

## 2021-05-16 ENCOUNTER — Encounter (HOSPITAL_COMMUNITY): Payer: Self-pay | Admitting: Orthopedic Surgery

## 2021-07-15 ENCOUNTER — Encounter (HOSPITAL_BASED_OUTPATIENT_CLINIC_OR_DEPARTMENT_OTHER): Payer: Self-pay | Admitting: Obstetrics and Gynecology

## 2021-07-15 ENCOUNTER — Emergency Department (HOSPITAL_BASED_OUTPATIENT_CLINIC_OR_DEPARTMENT_OTHER): Payer: Medicare Other | Admitting: Radiology

## 2021-07-15 ENCOUNTER — Emergency Department (HOSPITAL_BASED_OUTPATIENT_CLINIC_OR_DEPARTMENT_OTHER)
Admission: EM | Admit: 2021-07-15 | Discharge: 2021-07-15 | Disposition: A | Discharge status: Home / Self Care | Payer: Medicare Other | Attending: Emergency Medicine | Admitting: Emergency Medicine

## 2021-07-15 ENCOUNTER — Other Ambulatory Visit: Payer: Self-pay

## 2021-07-15 DIAGNOSIS — S60221A Contusion of right hand, initial encounter: Secondary | ICD-10-CM | POA: Diagnosis not present

## 2021-07-15 DIAGNOSIS — Z96611 Presence of right artificial shoulder joint: Secondary | ICD-10-CM | POA: Insufficient documentation

## 2021-07-15 DIAGNOSIS — Z96612 Presence of left artificial shoulder joint: Secondary | ICD-10-CM | POA: Diagnosis not present

## 2021-07-15 DIAGNOSIS — Z87891 Personal history of nicotine dependence: Secondary | ICD-10-CM | POA: Diagnosis not present

## 2021-07-15 DIAGNOSIS — M79641 Pain in right hand: Secondary | ICD-10-CM

## 2021-07-15 DIAGNOSIS — S6991XA Unspecified injury of right wrist, hand and finger(s), initial encounter: Secondary | ICD-10-CM | POA: Diagnosis present

## 2021-07-15 DIAGNOSIS — Z96643 Presence of artificial hip joint, bilateral: Secondary | ICD-10-CM | POA: Diagnosis not present

## 2021-07-15 DIAGNOSIS — W268XXA Contact with other sharp object(s), not elsewhere classified, initial encounter: Secondary | ICD-10-CM | POA: Insufficient documentation

## 2021-07-15 DIAGNOSIS — Z79899 Other long term (current) drug therapy: Secondary | ICD-10-CM | POA: Diagnosis not present

## 2021-07-15 DIAGNOSIS — Z96653 Presence of artificial knee joint, bilateral: Secondary | ICD-10-CM | POA: Insufficient documentation

## 2021-07-15 NOTE — ED Triage Notes (Signed)
Patient has discoloration and swelling to right hand after hitting it on the counter

## 2021-07-15 NOTE — ED Provider Notes (Signed)
[facility] EMERGENCY DEPT Provider Note   CSN: [id number] Arrival date & time: 07/15/21  1205     History Chief Complaint  Patient presents with   Hand Injury    [name] [name] is a 65 y.o. female.  HPI  65 year old female with a history of anxiety, deafness, seasonal allergies, hyperlipidemia, depressive disorder, mild cognitive impairment, osteoarthritis, OSA, who presents to the emergency department today for evaluation of right hand pain.  She states she was cooking yesterday when she hit her hand on the countertop.  She iced it however this morning she woke up with increased swelling, ecchymosis and pain to the right hand.  She denies any other injuries at this time.  She is not anticoagulated.  Past Medical History:  Diagnosis Date   Anxiety    Deafness    Wears Bilat hearing aids since Childhood   Environmental and seasonal allergies    Glaucoma, both eyes    Hyperlipidemia    Major depressive disorder    Mild cognitive impairment, so stated 07/16/2013   Patient subjectively complains of word finding and memory deficits. 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 05/20/2020   OSA on CPAP 05/20/2020   Paroxysmal cough 05/20/2020   OA (osteoarthritis) of hip 09/24/2017   Chronic pain syndrome 08/10/2016   Primary osteoarthritis of left knee 08/10/2016   Obesity (BMI 30-39.9) 07/15/2015   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) 11/21/2013   Mild cognitive impairment, so stated 07/16/2013   Anxiety and depression 07/16/2013   Hearing loss 09/04/2012   SINUSITIS, CHRONIC, NOS 10/04/2006   OSTEOARTHRITIS, LOWER  LEG 10/04/2006    Past Surgical History:  Procedure Laterality Date   ABDOMINAL HERNIA REPAIR     ABDOMINAL HYSTERECTOMY     BILATERAL CATARACT SURGERY      FRACTURE SURGERY Right    Foot   JOINT REPLACEMENT Bilateral    Knees   JOINT REPLACEMENT Bilateral 2020   Hips   KNEE ARTHROSCOPY W/ SYNOVECTOMY Left 07-20-2010  dr Lequita Halt  [facility]   LAPAROSCOPIC VAGINAL HYSTERECTOMY WITH SALPINGO OOPHORECTOMY Bilateral 09-25-2005   dr Renaldo Fiddler  [facility]   REVERSE SHOULDER ARTHROPLASTY Left 05/20/2020   Procedure: REVERSE SHOULDER ARTHROPLASTY;  Surgeon: Ernest Mallick, MD;  Location: [facility] OR;  Service: Orthopedics;  Laterality: Left;    REVERSE SHOULDER ARTHROPLASTY Right 05/12/2021   Procedure: REVERSE SHOULDER ARTHROPLASTY;  Surgeon: Francena Hanly, MD;  Location: [facility] ORS;  Service: Orthopedics;  Laterality: Right;    RIGHT ANKLE TENDON REPAIR  12-21-2008   dr Simonne Come  [facility]   TOTAL HIP ARTHROPLASTY Left 09/24/2017   Procedure: LEFT TOTAL HIP ARTHROPLASTY ANTERIOR APPROACH;  Surgeon: Ollen Gross, MD;  Location: [facility] ORS;  Service: Orthopedics;  Laterality: Left;   TOTAL HIP ARTHROPLASTY Right 12/19/2017   Procedure: RIGHT TOTAL HIP ARTHROPLASTY ANTERIOR APPROACH;  Surgeon: Ollen Gross, MD;  Location: [facility] ORS;  Service: Orthopedics;  Laterality: Right;   TOTAL KNEE ARTHROPLASTY Left 11/21/2013   Procedure: LEFT TOTAL KNEE  ARTHROPLASTY REVISION;  Surgeon: Loanne Drilling,  MD;  Location: [facility] ORS;  Service: Orthopedics;  Laterality: Left;   TOTAL KNEE ARTHROPLASTY Bilateral 06-11-2006   dr Lequita Halt  [facility]     OB History   No obstetric history on file.     Family History  Problem Relation Age of Onset   Depression Father    Depression Paternal Grandmother    Depression Mother    Alcohol abuse Mother    Depression Brother    Alcohol abuse Brother     Social History   Tobacco Use   Smoking status: Former    Years: 12.00    Types: Cigarettes    Quit date: 11/18/1988     Years since quitting: 32.6   Smokeless tobacco: Never  Vaping Use   Vaping Use: Never used  Substance Use Topics   Alcohol use: Yes    Comment: occ   Drug use: Never    Home Medications Prior to Admission medications   Medication Sig Start Date End Date Taking? Authorizing Provider  buPROPion (WELLBUTRIN XL) 150 MG 24 hr tablet Take 1 tablet (150 mg total) by mouth every morning. 05/10/21   Arfeen, Phillips Grout, MD  cholecalciferol (VITAMIN D3) 25 MCG (1000 UNIT) tablet Take 1,000 Units by mouth daily.    [provider]  ezetimibe (ZETIA) 10 MG tablet Take 1 tablet by mouth daily.    [provider]  lamoTRIgine (LAMICTAL) 200 MG tablet Take 1 tablet (200 mg total) by mouth daily. 05/10/21   Arfeen, Phillips Grout, MD  latanoprost (XALATAN) 0.005 % ophthalmic solution Place 1 drop into both eyes at bedtime. 04/20/20   [provider]  meloxicam (MOBIC) 15 MG tablet Take 1 tablet (15 mg total) by mouth daily. 05/12/21 05/12/22  Shuford, French Ana, PA-C  Misc Natural Products (NEURIVA PO) Take 1 capsule by mouth daily.    [provider]  ondansetron (ZOFRAN) 4 MG tablet Take 1 tablet (4 mg total) by mouth every 8 (eight) hours as needed for nausea or vomiting. 05/12/21   Shuford, French Ana, PA-C  oxyCODONE-acetaminophen (PERCOCET) 5-325 MG tablet Take 1 tablet by mouth every 4 (four) hours as needed (max 6 q). 05/12/21   Shuford, French Ana, PA-C  timolol (TIMOPTIC) 0.5 % ophthalmic solution Place 1 drop into both eyes daily.  06/21/15   [provider]  venlafaxine XR (EFFEXOR-XR) 150 MG 24 hr capsule Take 1 capsule (150 mg total) by mouth daily with breakfast. 05/10/21   Arfeen, Phillips Grout, MD  zolpidem (AMBIEN) 5 MG tablet Take 1 tablet (5 mg total) by mouth at bedtime as needed for sleep. 05/10/21   Arfeen, Phillips Grout, MD    Allergies    Aleve [naproxen], Gramineae pollens, and Statins  Review of Systems   Review of Systems  Constitutional:  Negative for fever.  Musculoskeletal:         Hand pain  Skin:  Positive for color change.   Physical Exam Updated Vital Signs BP (!) 141/87   Pulse 63   Temp 98.4 F (36.9 C)   Resp 16   Ht 5' (1.524 m)   Wt 72.6 kg   SpO2 100%   BMI 31.25 kg/m   Physical Exam Vitals and nursing note reviewed.  Constitutional:      General: She is not in acute distress.    Appearance: She is well-developed.  HENT:     Head: Normocephalic and atraumatic.  Eyes:     Conjunctiva/sclera: Conjunctivae normal.  Cardiovascular:     Rate and  Rhythm: Normal rate.  Pulmonary:     Effort: Pulmonary effort is normal.  Musculoskeletal:        General: Normal range of motion.     Cervical back: Neck supple.     Comments: Ecchymosis to the dorsum of the right hand with ttp over the right MCP. FROM of the right hand with normal grip strength.  Skin:    General: Skin is warm and dry.  Neurological:     Mental Status: She is alert.    ED Results / Procedures / Treatments   Labs (all labs ordered are listed, but only abnormal results are displayed) Labs Reviewed - No data to display  EKG None  Radiology DG Hand Complete Right  Result Date: 07/15/2021 CLINICAL DATA:  Right hand swelling after injury. EXAM: RIGHT HAND - COMPLETE 3+ VIEW COMPARISON:  None. FINDINGS: There is no evidence of fracture or dislocation. Status post surgical fusion of first metacarpophalangeal joint. Soft tissues are unremarkable. IMPRESSION: No acute abnormality seen. Electronically Signed   By: Lupita Raider M.D.   On: 07/15/2021 12:41    Procedures Procedures   Medications Ordered in ED Medications - No data to display  ED Course  I have reviewed the triage vital signs and the nursing notes.  Pertinent labs & imaging results that were available during my care of the patient were reviewed by me and considered in my medical decision making (see chart for details).    MDM Rules/Calculators/A&P                          65 year old female  presenting for evaluation of right hand pain after she hit it on a countertop yesterday.  X-ray of the right hand is negative for any fracture or traumatic abnormality.  Likely contusion.  Advised supportive care with RICE protocol, elevation and anti-inflammatories.  She voices understanding of the plan and reasons to return.  All questions answered.  Patient stable for discharge.   Final Clinical Impression(s) / ED Diagnoses Final diagnoses:  Pain of right hand    Rx / DC Orders ED Discharge Orders     None        Karrie Meres, PA-C 07/15/21 1258    Melene Plan, DO 07/15/21 1503

## 2021-07-15 NOTE — Discharge Instructions (Addendum)
Your x-ray did not show any evidence of any broken bones or dislocation of your fingers.  You likely have a contusion which will heal with time.  Recommend cold compresses for 30 minutes at a time several times a day, elevation to help reduce swelling as well as anti-inflammatories for pain.  Can follow-up with your regular doctor in 1 week for reassessment of symptoms and return to the emergency department for new or worsening symptoms in the meantime.

## 2021-07-15 NOTE — ED Notes (Signed)
Pt d/c home per MD Order. Discharge summary reviewed, pt verbalizes understanding. Ambulatory. No s/s of acute distress noted at discharge.

## 2021-08-10 ENCOUNTER — Other Ambulatory Visit: Payer: Self-pay

## 2021-08-10 ENCOUNTER — Encounter (HOSPITAL_COMMUNITY): Payer: Self-pay | Admitting: Psychiatry

## 2021-08-10 ENCOUNTER — Telehealth (HOSPITAL_BASED_OUTPATIENT_CLINIC_OR_DEPARTMENT_OTHER): Payer: Medicare Other | Admitting: Psychiatry

## 2021-08-10 DIAGNOSIS — F5101 Primary insomnia: Secondary | ICD-10-CM

## 2021-08-10 DIAGNOSIS — F411 Generalized anxiety disorder: Secondary | ICD-10-CM | POA: Diagnosis not present

## 2021-08-10 DIAGNOSIS — F330 Major depressive disorder, recurrent, mild: Secondary | ICD-10-CM

## 2021-08-10 MED ORDER — BUPROPION HCL ER (XL) 150 MG PO TB24: 150.0000 mg | ORAL_TABLET | Freq: Every morning | ORAL | 0 refills | Status: DC

## 2021-08-10 MED ORDER — VENLAFAXINE HCL ER 150 MG PO CP24: 150.0000 mg | ORAL_CAPSULE | Freq: Every day | ORAL | 0 refills | Status: DC

## 2021-08-10 MED ORDER — LAMOTRIGINE 200 MG PO TABS: 200.0000 mg | ORAL_TABLET | Freq: Every day | ORAL | 0 refills | Status: DC

## 2021-08-10 MED ORDER — ZOLPIDEM TARTRATE 5 MG PO TABS: 5.0000 mg | ORAL_TABLET | Freq: Every evening | ORAL | 1 refills | Status: DC | PRN

## 2021-08-10 NOTE — Progress Notes (Signed)
Virtual Visit via Telephone Note  I connected with [name] [name] on 08/10/21 at  2:40 PM EST by telephone and verified that I am speaking with the correct person using two identifiers.  Location: Patient: Home Provider: Home Office   I discussed the limitations, risks, security and privacy concerns of performing an evaluation and management service by telephone and the availability of in person appointments. I also discussed with the patient that there may be a patient responsible charge related to this service. The patient expressed understanding and agreed to proceed.   History of Present Illness: Patient is evaluated by phone session.  She is taking her medication as prescribed.  She feels her anxiety depression is much better however she still have chronic pain which sometimes clears up. She recently seen orthopedic Dr. Sheran [name] for pain management.  She is taking pain medicine on and off which helps some of the time.  She also prescribed steroids and now MRI is scheduled.  Patient does not want to change the medication since she feels her anxiety is under control and she denies any recent crying spells, feeling of hopelessness or worthlessness.  Sometimes she is concerned about her father who has physical issues including A. fib and memory problems.  Patient told her father is now seeing physician at A. fib clinic.  She rarely takes Ambien when she cannot sleep.  Her appetite is okay.  Her weight is stable.  Past Psychiatric History: Reviewed. H/O inpatient in 2010 after brother committed suicide. On meds since 2008.  Took Prozac (manic), Paxil (did not help) and Abilify (headaches and diarrhea).  No h/o suicidal attempt or hallucinations.     Psychiatric Specialty Exam: Physical Exam  Review of Systems  Weight 162 lb (73.5 kg).There is no height or weight on file to calculate BMI.  General Appearance: NA  Eye Contact:  NA  Speech:  Normal Rate  Volume:  Normal  Mood:  Euthymic   Affect:  NA  Thought Process:  Goal Directed  Orientation:  Full (Time, Place, and Person)  Thought Content:  Logical  Suicidal Thoughts:  No  Homicidal Thoughts:  No  Memory:  Immediate;   Good Recent;   Good Remote;   Good  Judgement:  Good  Insight:  Present  Psychomotor Activity:  NA  Concentration:  Concentration: Good and Attention Span: Good  Recall:  Good  Fund of Knowledge:  Good  Language:  Fair  Akathisia:  No  Handed:  Right  AIMS (if indicated):     Assets:  Communication Skills Desire for Improvement Housing Resilience Social Support  ADL's:  Intact  Cognition:  WNL  Sleep:   ok      Assessment and Plan: Major depressive disorder, recurrent.  Anxiety.  Primary insomnia.  Patient conditions are stable however she is concerned about her chronic pain and hoping to have a follow-up with her pain management.  She has an MRI scheduled.  So far she has no issues or concerns from the medication.  Since she moved in with her father and has no contact with her previous partner [name] [name] she has been doing well.  Continue Wellbutrin XL 150 mg daily, Ambien 5 mg as needed for insomnia, Lamictal 200 mg daily and Effexor 150 mg daily.  Recommended to call us back if she is any question or any concern.  Follow-up in 3 months.  Follow Up Instructions:    I discussed the assessment and treatment plan with the patient. The  patient was provided an opportunity to ask questions and all were answered. The patient agreed with the plan and demonstrated an understanding of the instructions.   The patient was advised to call back or seek an in-person evaluation if the symptoms worsen or if the condition fails to improve as anticipated.  I provided 19 minutes of non-face-to-face time during this encounter.   Kathlee Nations, MD

## 2021-09-21 ENCOUNTER — Other Ambulatory Visit: Payer: Self-pay | Admitting: Neurological Surgery

## 2021-09-26 NOTE — Pre-Procedure Instructions (Signed)
Surgical Instructions    Your procedure is scheduled on Friday 09/30/21.   Report to [facility] Main Entrance "A" at 08:00 A.M., then check in with the Admitting office.  Call this number if you have problems the morning of surgery:  [phone]   If you have any questions prior to your surgery date call [phone]: Open Monday-Friday 8am-4pm    Remember:  Do not eat or drink after midnight the night before your surgery    Take these medicines the morning of surgery with A SIP OF WATER:   buPROPion (WELLBUTRIN XL)   ezetimibe (ZETIA)  lamoTRIgine (LAMICTAL)  timolol (TIMOPTIC)   venlafaxine XR (EFFEXOR-XR)    HYDROcodone-acetaminophen (NORCO)- If needed   As of today, STOP taking any Aspirin (unless otherwise instructed by your surgeon) meloxicam (MOBIC), Aleve, Naproxen, Ibuprofen, Motrin, Advil, Goody's, BC's, all herbal medications, fish oil, and all vitamins.           Do not wear jewelry or makeup Do not wear lotions, powders, perfumes/colognes, or deodorant. Do not shave 48 hours prior to surgery.  Men may shave face and neck. Do not bring valuables to the hospital. Do not wear nail polish, gel polish, artificial nails, or any other type of covering on natural nails (fingers and toes) If you have artificial nails or gel coating that need to be removed by a nail salon, please have this removed prior to surgery. Artificial nails or gel coating may interfere with anesthesia's ability to adequately monitor your vital signs.  [facility] is not responsible for any belongings or valuables. .   Do NOT Smoke (Tobacco/Vaping)  24 hours prior to your procedure  If you use a CPAP at night, you may bring your mask for your overnight stay.   Contacts, glasses, hearing aids, dentures or partials may not be worn into surgery, please bring cases for these belongings   For patients admitted to the hospital, discharge time will be determined by your treatment team.   Patients  discharged the day of surgery will not be allowed to drive home, and someone needs to stay with them for 24 hours.  NO VISITORS WILL BE ALLOWED IN PRE-OP WHERE PATIENTS ARE PREPPED FOR SURGERY.  ONLY 1 SUPPORT PERSON MAY BE PRESENT IN THE WAITING ROOM WHILE YOU ARE IN SURGERY.  IF YOU ARE TO BE ADMITTED, ONCE YOU ARE IN YOUR ROOM YOU WILL BE ALLOWED TWO (2) VISITORS. 1 (ONE) VISITOR MAY STAY OVERNIGHT BUT MUST ARRIVE TO THE ROOM BY 8pm.  Minor children may have two parents present. Special consideration for safety and communication needs will be reviewed on a case by case basis.  Special instructions:    Oral Hygiene is also important to reduce your risk of infection.  Remember - BRUSH YOUR TEETH THE MORNING OF SURGERY WITH YOUR REGULAR TOOTHPASTE   [facility]- Preparing For Surgery  Before surgery, you can play an important role. Because skin is not sterile, your skin needs to be as free of germs as possible. You can reduce the number of germs on your skin by washing with CHG (chlorahexidine gluconate) Soap before surgery.  CHG is an antiseptic cleaner which kills germs and bonds with the skin to continue killing germs even after washing.     Please do not use if you have an allergy to CHG or antibacterial soaps. If your skin becomes reddened/irritated stop using the CHG.  Do not shave (including legs and underarms) for at least 48 hours prior to  first CHG shower. It is OK to shave your face.  Please follow these instructions carefully.     Shower the NIGHT BEFORE SURGERY and the MORNING OF SURGERY with CHG Soap.   If you chose to wash your hair, wash your hair first as usual with your normal shampoo. After you shampoo, rinse your hair and body thoroughly to remove the shampoo.  Then [employer] and genitals (private parts) with your normal soap and rinse thoroughly to remove soap.  After that Use CHG Soap as you would any other liquid soap. You can apply CHG directly to the skin and wash  gently with a scrungie or a clean washcloth.   Apply the CHG Soap to your body ONLY FROM THE NECK DOWN.  Do not use on open wounds or open sores. Avoid contact with your eyes, ears, mouth and genitals (private parts). Wash Face and genitals (private parts)  with your normal soap.   Wash thoroughly, paying special attention to the area where your surgery will be performed.  Thoroughly rinse your body with warm water from the neck down.  DO NOT shower/wash with your normal soap after using and rinsing off the CHG Soap.  Pat yourself dry with a CLEAN TOWEL.  Wear CLEAN PAJAMAS to bed the night before surgery  Place CLEAN SHEETS on your bed the night before your surgery  DO NOT SLEEP WITH PETS.   Day of Surgery:  Take a shower with CHG soap. Wear Clean/Comfortable clothing the morning of surgery Do not apply any deodorants/lotions.   Remember to brush your teeth WITH YOUR REGULAR TOOTHPASTE.    COVID testing  If you are going to stay overnight or be admitted after your procedure/surgery and require a pre-op COVID test, please follow these instructions after your COVID test   You are not required to quarantine however you are required to wear a well-fitting mask when you are out and around people not in your household.  If your mask becomes wet or soiled, replace with a new one.  Wash your hands often with soap and water for 20 seconds or clean your hands with an alcohol-based hand sanitizer that contains at least 60% alcohol.  Do not share personal items.  Notify your provider: if you are in close contact with someone who has COVID  or if you develop a fever of 100.4 or greater, sneezing, cough, sore throat, shortness of breath or body aches.    Please read over the following fact sheets that you were given.

## 2021-09-27 ENCOUNTER — Encounter (HOSPITAL_COMMUNITY)
Admission: RE | Admit: 2021-09-27 | Discharge: 2021-09-27 | Disposition: A | Discharge status: Home / Self Care | Payer: Medicare Other | Source: Ambulatory Visit | Attending: Neurological Surgery | Admitting: Neurological Surgery

## 2021-09-27 ENCOUNTER — Other Ambulatory Visit: Payer: Self-pay

## 2021-09-27 ENCOUNTER — Encounter (HOSPITAL_COMMUNITY): Payer: Self-pay

## 2021-09-27 VITALS — BP 157/95 | HR 85 | Temp 99.1°F | Resp 17 | Ht 60.0 in | Wt 167.0 lb

## 2021-09-27 DIAGNOSIS — Z20822 Contact with and (suspected) exposure to covid-19: Secondary | ICD-10-CM | POA: Diagnosis not present

## 2021-09-27 DIAGNOSIS — Z01818 Encounter for other preprocedural examination: Secondary | ICD-10-CM

## 2021-09-27 DIAGNOSIS — Z01812 Encounter for preprocedural laboratory examination: Secondary | ICD-10-CM | POA: Insufficient documentation

## 2021-09-27 LAB — BASIC METABOLIC PANEL
Anion gap: 10 (ref 5–15)
BUN: 12 mg/dL (ref 8–23)
CO2: 27 mmol/L (ref 22–32)
Calcium: 9.4 mg/dL (ref 8.9–10.3)
Chloride: 100 mmol/L (ref 98–111)
Creatinine, Ser: 0.69 mg/dL (ref 0.44–1.00)
GFR, Estimated: >60 mL/min (ref >60)
Glucose, Bld: 92 mg/dL (ref 70–99)
Potassium: 3.8 mmol/L (ref 3.5–5.1)
Sodium: 137 mmol/L (ref 135–145)

## 2021-09-27 LAB — CBC
HCT: 39.6 % (ref 36.0–46.0)
Hemoglobin: 12.8 g/dL (ref 12.0–15.0)
MCH: 31.8 pg (ref 26.0–34.0)
MCHC: 32.3 g/dL (ref 30.0–36.0)
MCV: 98.3 fL (ref 80.0–100.0)
Platelets: 287 10*3/uL (ref 150–400)
RBC: 4.03 MIL/uL (ref 3.87–5.11)
RDW: 12.3 % (ref 11.5–15.5)
WBC: 6.3 10*3/uL (ref 4.0–10.5)
nRBC: 0 % (ref 0.0–0.2)

## 2021-09-27 LAB — SURGICAL PCR SCREEN
MRSA, PCR: NEGATIVE
Staphylococcus aureus: POSITIVE — AB

## 2021-09-27 LAB — PROTIME-INR
INR: 0.9 (ref 0.8–1.2)
Prothrombin Time: 12.3 seconds (ref 11.4–15.2)

## 2021-09-27 NOTE — Progress Notes (Signed)
PCP - Dr. Bernerd Limbo Cardiologist - denies  PPM/ICD - n/a Device Orders - n/a Rep Notified - n/a  Chest x-ray - n/a EKG - n/a Stress Test - denies ECHO - denies Cardiac Cath - denies  Sleep Study - +OSA CPAP - Does not use Bipap. States there was a recall on her machine  Fasting Blood Sugar - n/a Checks Blood Sugar _____ times a day- n/a  Blood Thinner Instructions: n/a Aspirin Instructions: n/a  ERAS Protcol - No. NPO  COVID TEST- 09/27/21. Pending.    Anesthesia review: No  Patient denies shortness of breath, fever, cough and chest pain at PAT appointment   All instructions explained to the patient, with a verbal understanding of the material. Patient agrees to go over the instructions while at home for a better understanding. Patient also instructed to self quarantine after being tested for COVID-19. The opportunity to ask questions was provided.

## 2021-09-28 LAB — SARS CORONAVIRUS 2 (TAT 6-24 HRS): SARS Coronavirus 2: NEGATIVE

## 2021-09-30 ENCOUNTER — Observation Stay (HOSPITAL_COMMUNITY)
Admission: RE | Admit: 2021-09-30 | Discharge: 2021-10-01 | Disposition: A | Discharge status: Home / Self Care | Payer: Medicare Other | Attending: Neurological Surgery | Admitting: Neurological Surgery

## 2021-09-30 ENCOUNTER — Other Ambulatory Visit: Payer: Self-pay

## 2021-09-30 ENCOUNTER — Ambulatory Visit (HOSPITAL_BASED_OUTPATIENT_CLINIC_OR_DEPARTMENT_OTHER): Payer: Medicare Other | Admitting: Anesthesiology

## 2021-09-30 ENCOUNTER — Encounter (HOSPITAL_COMMUNITY): Payer: Self-pay | Admitting: Neurological Surgery

## 2021-09-30 ENCOUNTER — Ambulatory Visit (HOSPITAL_COMMUNITY): Payer: Medicare Other | Admitting: Anesthesiology

## 2021-09-30 ENCOUNTER — Ambulatory Visit (HOSPITAL_COMMUNITY)
Admission: RE | Disposition: A | Discharge status: Home / Self Care | Payer: Self-pay | Source: Home / Self Care | Attending: Neurological Surgery

## 2021-09-30 ENCOUNTER — Ambulatory Visit (HOSPITAL_COMMUNITY): Payer: Medicare Other

## 2021-09-30 DIAGNOSIS — F17210 Nicotine dependence, cigarettes, uncomplicated: Secondary | ICD-10-CM | POA: Diagnosis not present

## 2021-09-30 DIAGNOSIS — M4722 Other spondylosis with radiculopathy, cervical region: Secondary | ICD-10-CM | POA: Diagnosis present

## 2021-09-30 DIAGNOSIS — M4802 Spinal stenosis, cervical region: Secondary | ICD-10-CM | POA: Diagnosis not present

## 2021-09-30 DIAGNOSIS — Z96643 Presence of artificial hip joint, bilateral: Secondary | ICD-10-CM | POA: Diagnosis not present

## 2021-09-30 DIAGNOSIS — Z419 Encounter for procedure for purposes other than remedying health state, unspecified: Secondary | ICD-10-CM

## 2021-09-30 DIAGNOSIS — Z96653 Presence of artificial knee joint, bilateral: Secondary | ICD-10-CM | POA: Insufficient documentation

## 2021-09-30 DIAGNOSIS — Z981 Arthrodesis status: Secondary | ICD-10-CM

## 2021-09-30 DIAGNOSIS — M5010 Cervical disc disorder with radiculopathy, unspecified cervical region: Secondary | ICD-10-CM

## 2021-09-30 HISTORY — PX: ANTERIOR CERVICAL DECOMP/DISCECTOMY FUSION: SHX1161

## 2021-09-30 SURGERY — ANTERIOR CERVICAL DECOMPRESSION/DISCECTOMY FUSION 2 LEVELS
Anesthesia: General | Site: Spine Cervical

## 2021-09-30 MED ORDER — MIDAZOLAM HCL 2 MG/2ML IJ SOLN
INTRAMUSCULAR | Status: AC
  Filled 2021-09-30: qty 2

## 2021-09-30 MED ORDER — LACTATED RINGERS IV SOLN: INTRAVENOUS | Status: DC

## 2021-09-30 MED ORDER — ONDANSETRON HCL 4 MG/2ML IJ SOLN
INTRAMUSCULAR | Status: AC
  Filled 2021-09-30: qty 2

## 2021-09-30 MED ORDER — FENTANYL CITRATE (PF) 250 MCG/5ML IJ SOLN
INTRAMUSCULAR | Status: AC
  Filled 2021-09-30: qty 5

## 2021-09-30 MED ORDER — SODIUM CHLORIDE 0.9% FLUSH: 3.0000 mL | INTRAVENOUS | Status: DC | PRN

## 2021-09-30 MED ORDER — THROMBIN 5000 UNITS EX SOLR
CUTANEOUS | Status: AC
  Filled 2021-09-30: qty 5000

## 2021-09-30 MED ORDER — SODIUM CHLORIDE 0.9% FLUSH
3.0000 mL | Freq: Two times a day (BID) | INTRAVENOUS | Status: DC
  Administered 2021-09-30 (×2): 3 mL via INTRAVENOUS

## 2021-09-30 MED ORDER — ACETAMINOPHEN 325 MG PO TABS: 650.0000 mg | ORAL_TABLET | ORAL | Status: DC | PRN

## 2021-09-30 MED ORDER — PHENYLEPHRINE HCL-NACL 20-0.9 MG/250ML-% IV SOLN
INTRAVENOUS | Status: DC | PRN
  Administered 2021-09-30: 10 ug/min via INTRAVENOUS

## 2021-09-30 MED ORDER — ONDANSETRON HCL 4 MG/2ML IJ SOLN: 4.0000 mg | Freq: Four times a day (QID) | INTRAMUSCULAR | Status: DC | PRN

## 2021-09-30 MED ORDER — DEXAMETHASONE 4 MG PO TABS
4.0000 mg | ORAL_TABLET | Freq: Four times a day (QID) | ORAL | Status: DC
  Administered 2021-10-01: 4 mg via ORAL
  Filled 2021-09-30: qty 1

## 2021-09-30 MED ORDER — POTASSIUM CHLORIDE IN NACL 20-0.9 MEQ/L-% IV SOLN: INTRAVENOUS | Status: DC

## 2021-09-30 MED ORDER — FENTANYL CITRATE (PF) 250 MCG/5ML IJ SOLN
INTRAMUSCULAR | Status: DC | PRN
  Administered 2021-09-30 (×2): 25 ug via INTRAVENOUS
  Administered 2021-09-30: 100 ug via INTRAVENOUS

## 2021-09-30 MED ORDER — ONDANSETRON HCL 4 MG PO TABS: 4.0000 mg | ORAL_TABLET | Freq: Four times a day (QID) | ORAL | Status: DC | PRN

## 2021-09-30 MED ORDER — HYDROMORPHONE HCL 1 MG/ML IJ SOLN
INTRAMUSCULAR | Status: AC
  Filled 2021-09-30: qty 1

## 2021-09-30 MED ORDER — LIDOCAINE 2% (20 MG/ML) 5 ML SYRINGE
INTRAMUSCULAR | Status: AC
  Filled 2021-09-30: qty 5

## 2021-09-30 MED ORDER — CHLORHEXIDINE GLUCONATE 0.12 % MT SOLN
15.0000 mL | Freq: Once | OROMUCOSAL | Status: AC
  Administered 2021-09-30: 15 mL via OROMUCOSAL
  Filled 2021-09-30: qty 15

## 2021-09-30 MED ORDER — HYDROMORPHONE HCL 1 MG/ML IJ SOLN
0.2500 mg | INTRAMUSCULAR | Status: DC | PRN
  Administered 2021-09-30 (×2): 0.5 mg via INTRAVENOUS

## 2021-09-30 MED ORDER — PHENOL 1.4 % MT LIQD: 1.0000 | OROMUCOSAL | Status: DC | PRN

## 2021-09-30 MED ORDER — 0.9 % SODIUM CHLORIDE (POUR BTL) OPTIME
TOPICAL | Status: DC | PRN
  Administered 2021-09-30: 1000 mL

## 2021-09-30 MED ORDER — LATANOPROST 0.005 % OP SOLN
1.0000 [drp] | Freq: Every day | OPHTHALMIC | Status: DC
  Administered 2021-09-30: 1 [drp] via OPHTHALMIC
  Filled 2021-09-30: qty 2.5

## 2021-09-30 MED ORDER — DEXAMETHASONE SODIUM PHOSPHATE 4 MG/ML IJ SOLN
4.0000 mg | Freq: Four times a day (QID) | INTRAMUSCULAR | Status: DC
  Administered 2021-09-30 (×2): 4 mg via INTRAVENOUS
  Filled 2021-09-30 (×3): qty 1

## 2021-09-30 MED ORDER — SENNA 8.6 MG PO TABS
1.0000 | ORAL_TABLET | Freq: Two times a day (BID) | ORAL | Status: DC
  Administered 2021-09-30 – 2021-10-01 (×3): 8.6 mg via ORAL
  Filled 2021-09-30 (×3): qty 1

## 2021-09-30 MED ORDER — ROCURONIUM BROMIDE 10 MG/ML (PF) SYRINGE
PREFILLED_SYRINGE | INTRAVENOUS | Status: AC
  Filled 2021-09-30: qty 10

## 2021-09-30 MED ORDER — BUPIVACAINE HCL (PF) 0.25 % IJ SOLN
INTRAMUSCULAR | Status: AC
  Filled 2021-09-30: qty 30

## 2021-09-30 MED ORDER — DEXAMETHASONE SODIUM PHOSPHATE 10 MG/ML IJ SOLN
INTRAMUSCULAR | Status: DC | PRN
  Administered 2021-09-30: 10 mg via INTRAVENOUS

## 2021-09-30 MED ORDER — PHENYLEPHRINE 40 MCG/ML (10ML) SYRINGE FOR IV PUSH (FOR BLOOD PRESSURE SUPPORT): PREFILLED_SYRINGE | INTRAVENOUS | Status: DC | PRN

## 2021-09-30 MED ORDER — ONDANSETRON HCL 4 MG/2ML IJ SOLN
INTRAMUSCULAR | Status: DC | PRN
  Administered 2021-09-30: 4 mg via INTRAVENOUS

## 2021-09-30 MED ORDER — ACETAMINOPHEN 10 MG/ML IV SOLN: 1000.0000 mg | Freq: Once | INTRAVENOUS | Status: DC | PRN

## 2021-09-30 MED ORDER — BUPIVACAINE HCL (PF) 0.25 % IJ SOLN
INTRAMUSCULAR | Status: DC | PRN
  Administered 2021-09-30: 6 mL

## 2021-09-30 MED ORDER — CHLORHEXIDINE GLUCONATE CLOTH 2 % EX PADS: 6.0000 | MEDICATED_PAD | Freq: Once | CUTANEOUS | Status: DC

## 2021-09-30 MED ORDER — LAMOTRIGINE 100 MG PO TABS
200.0000 mg | ORAL_TABLET | Freq: Every day | ORAL | Status: DC
  Administered 2021-10-01: 200 mg via ORAL
  Filled 2021-09-30: qty 2

## 2021-09-30 MED ORDER — SODIUM CHLORIDE 0.9 % IV SOLN
250.0000 mL | INTRAVENOUS | Status: DC
  Administered 2021-09-30: 250 mL via INTRAVENOUS

## 2021-09-30 MED ORDER — PROPOFOL 10 MG/ML IV BOLUS
INTRAVENOUS | Status: AC
  Filled 2021-09-30: qty 20

## 2021-09-30 MED ORDER — LIDOCAINE 2% (20 MG/ML) 5 ML SYRINGE
INTRAMUSCULAR | Status: DC | PRN
Start: 2021-09-30 — End: 2021-09-30
  Administered 2021-09-30: 100 mg via INTRAVENOUS

## 2021-09-30 MED ORDER — EZETIMIBE 10 MG PO TABS
10.0000 mg | ORAL_TABLET | Freq: Every day | ORAL | Status: DC
  Administered 2021-10-01: 10 mg via ORAL
  Filled 2021-09-30: qty 1

## 2021-09-30 MED ORDER — AMISULPRIDE (ANTIEMETIC) 5 MG/2ML IV SOLN: 10.0000 mg | Freq: Once | INTRAVENOUS | Status: DC | PRN

## 2021-09-30 MED ORDER — ORAL CARE MOUTH RINSE: 15.0000 mL | Freq: Once | OROMUCOSAL | Status: AC

## 2021-09-30 MED ORDER — ACETAMINOPHEN 650 MG RE SUPP: 650.0000 mg | RECTAL | Status: DC | PRN

## 2021-09-30 MED ORDER — ALBUMIN HUMAN 5 % IV SOLN: INTRAVENOUS | Status: DC | PRN

## 2021-09-30 MED ORDER — THROMBIN 5000 UNITS EX SOLR: OROMUCOSAL | Status: DC | PRN

## 2021-09-30 MED ORDER — CEFAZOLIN SODIUM-DEXTROSE 2-4 GM/100ML-% IV SOLN
2.0000 g | INTRAVENOUS | Status: AC
  Administered 2021-09-30: 2 g via INTRAVENOUS
  Filled 2021-09-30: qty 100

## 2021-09-30 MED ORDER — METHOCARBAMOL 500 MG PO TABS
500.0000 mg | ORAL_TABLET | Freq: Four times a day (QID) | ORAL | Status: DC | PRN
  Administered 2021-09-30 – 2021-10-01 (×2): 500 mg via ORAL
  Filled 2021-09-30 (×2): qty 1

## 2021-09-30 MED ORDER — MEPERIDINE HCL 25 MG/ML IJ SOLN: 6.2500 mg | INTRAMUSCULAR | Status: DC | PRN

## 2021-09-30 MED ORDER — HYDROCODONE-ACETAMINOPHEN 10-325 MG PO TABS
1.0000 | ORAL_TABLET | ORAL | Status: DC | PRN
  Administered 2021-09-30 – 2021-10-01 (×4): 1 via ORAL
  Filled 2021-09-30 (×4): qty 1

## 2021-09-30 MED ORDER — MIDAZOLAM HCL 2 MG/2ML IJ SOLN
INTRAMUSCULAR | Status: DC | PRN
Start: 2021-09-30 — End: 2021-09-30
  Administered 2021-09-30: 1 mg via INTRAVENOUS

## 2021-09-30 MED ORDER — ACETAMINOPHEN 500 MG PO TABS
1000.0000 mg | ORAL_TABLET | ORAL | Status: AC
  Administered 2021-09-30: 1000 mg via ORAL
  Filled 2021-09-30: qty 2

## 2021-09-30 MED ORDER — ROCURONIUM BROMIDE 10 MG/ML (PF) SYRINGE
PREFILLED_SYRINGE | INTRAVENOUS | Status: DC | PRN
  Administered 2021-09-30: 60 mg via INTRAVENOUS
  Administered 2021-09-30: 30 mg via INTRAVENOUS

## 2021-09-30 MED ORDER — ACETAMINOPHEN 160 MG/5ML PO SOLN: 325.0000 mg | Freq: Once | ORAL | Status: DC | PRN

## 2021-09-30 MED ORDER — METHOCARBAMOL 1000 MG/10ML IJ SOLN
500.0000 mg | Freq: Four times a day (QID) | INTRAVENOUS | Status: DC | PRN
  Filled 2021-09-30: qty 5

## 2021-09-30 MED ORDER — GABAPENTIN 300 MG PO CAPS
300.0000 mg | ORAL_CAPSULE | ORAL | Status: AC
  Administered 2021-09-30: 300 mg via ORAL
  Filled 2021-09-30: qty 1

## 2021-09-30 MED ORDER — TIMOLOL MALEATE 0.5 % OP SOLN
1.0000 [drp] | Freq: Every morning | OPHTHALMIC | Status: DC
  Administered 2021-10-01: 1 [drp] via OPHTHALMIC
  Filled 2021-09-30: qty 5

## 2021-09-30 MED ORDER — EPHEDRINE 5 MG/ML INJ
INTRAVENOUS | Status: AC
  Filled 2021-09-30: qty 5

## 2021-09-30 MED ORDER — MORPHINE SULFATE (PF) 2 MG/ML IV SOLN: 2.0000 mg | INTRAVENOUS | Status: DC | PRN

## 2021-09-30 MED ORDER — ACETAMINOPHEN 325 MG PO TABS: 325.0000 mg | ORAL_TABLET | Freq: Once | ORAL | Status: DC | PRN

## 2021-09-30 MED ORDER — PROPOFOL 10 MG/ML IV BOLUS
INTRAVENOUS | Status: DC | PRN
Start: 2021-09-30 — End: 2021-09-30
  Administered 2021-09-30: 110 mg via INTRAVENOUS

## 2021-09-30 MED ORDER — CEFAZOLIN SODIUM-DEXTROSE 2-4 GM/100ML-% IV SOLN
2.0000 g | Freq: Three times a day (TID) | INTRAVENOUS | Status: AC
  Administered 2021-09-30 (×2): 2 g via INTRAVENOUS
  Filled 2021-09-30 (×2): qty 100

## 2021-09-30 MED ORDER — THROMBIN 5000 UNITS EX SOLR
CUTANEOUS | Status: DC | PRN
  Administered 2021-09-30: 5000 [IU] via TOPICAL

## 2021-09-30 MED ORDER — BUPROPION HCL ER (XL) 150 MG PO TB24
150.0000 mg | ORAL_TABLET | Freq: Every morning | ORAL | Status: DC
  Administered 2021-10-01: 150 mg via ORAL
  Filled 2021-09-30: qty 1

## 2021-09-30 MED ORDER — SUGAMMADEX SODIUM 200 MG/2ML IV SOLN
INTRAVENOUS | Status: DC | PRN
  Administered 2021-09-30: 200 mg via INTRAVENOUS

## 2021-09-30 MED ORDER — EPHEDRINE SULFATE-NACL 50-0.9 MG/10ML-% IV SOSY
PREFILLED_SYRINGE | INTRAVENOUS | Status: DC | PRN
  Administered 2021-09-30: 5 mg via INTRAVENOUS

## 2021-09-30 MED ORDER — VENLAFAXINE HCL ER 150 MG PO CP24
150.0000 mg | ORAL_CAPSULE | Freq: Every day | ORAL | Status: DC
  Filled 2021-09-30: qty 1

## 2021-09-30 MED ORDER — DEXAMETHASONE SODIUM PHOSPHATE 10 MG/ML IJ SOLN
INTRAMUSCULAR | Status: AC
  Filled 2021-09-30: qty 1

## 2021-09-30 MED ORDER — MENTHOL 3 MG MT LOZG: 1.0000 | LOZENGE | OROMUCOSAL | Status: DC | PRN

## 2021-09-30 MED ORDER — THROMBIN (RECOMBINANT) 5000 UNITS EX SOLR
CUTANEOUS | Status: AC
  Filled 2021-09-30: qty 5000

## 2021-09-30 SURGICAL SUPPLY — 54 items
APL SKNCLS STERI-STRIP NONHPOA (GAUZE/BANDAGES/DRESSINGS) ×1
BAG COUNTER SPONGE SURGICOUNT (BAG) ×3 IMPLANT
BAG SPNG CNTER NS LX DISP (BAG) ×1
BAND INSRT 18 STRL LF DISP RB (MISCELLANEOUS) ×2
BAND RUBBER #18 3X1/16 STRL (MISCELLANEOUS) ×6 IMPLANT
BASKET BONE COLLECTION (BASKET) IMPLANT
BENZOIN TINCTURE PRP APPL 2/3 (GAUZE/BANDAGES/DRESSINGS) ×3 IMPLANT
BIT DRILL 2.3X12 (BIT) ×1 IMPLANT
BUR CARBIDE MATCH 3.0 (BURR) ×3 IMPLANT
CANISTER SUCT 3000ML PPV (MISCELLANEOUS) ×3 IMPLANT
CLSR STERI-STRIP ANTIMIC 1/2X4 (GAUZE/BANDAGES/DRESSINGS) ×1 IMPLANT
DRAPE C-ARM 42X72 X-RAY (DRAPES) ×6 IMPLANT
DRAPE LAPAROTOMY 100X72 PEDS (DRAPES) ×3 IMPLANT
DRAPE MICROSCOPE LEICA (MISCELLANEOUS) ×3 IMPLANT
DRSG OPSITE POSTOP 4X6 (GAUZE/BANDAGES/DRESSINGS) ×1 IMPLANT
DURAPREP 6ML APPLICATOR 50/CS (WOUND CARE) ×3 IMPLANT
ELECT COATED BLADE 2.86 ST (ELECTRODE) ×3 IMPLANT
ELECT REM PT RETURN 9FT ADLT (ELECTROSURGICAL) ×2
ELECTRODE REM PT RTRN 9FT ADLT (ELECTROSURGICAL) ×2 IMPLANT
GAUZE 4X4 16PLY ~~LOC~~+RFID DBL (SPONGE) IMPLANT
GLOVE SURG ENC MOIS LTX SZ7 (GLOVE) ×6 IMPLANT
GLOVE SURG ENC MOIS LTX SZ8 (GLOVE) ×4 IMPLANT
GLOVE SURG POLYISO LF SZ7 (GLOVE) ×3 IMPLANT
GLOVE SURG PR MICRO ENCORE 7.5 (GLOVE) ×1 IMPLANT
GLOVE SURG UNDER POLY LF SZ7 (GLOVE) ×6 IMPLANT
GLOVE SURG UNDER POLY LF SZ7.5 (GLOVE) ×1 IMPLANT
GOWN STRL REUS W/ TWL LRG LVL3 (GOWN DISPOSABLE) IMPLANT
GOWN STRL REUS W/ TWL XL LVL3 (GOWN DISPOSABLE) ×2 IMPLANT
GOWN STRL REUS W/TWL 2XL LVL3 (GOWN DISPOSABLE) IMPLANT
GOWN STRL REUS W/TWL LRG LVL3 (GOWN DISPOSABLE) ×4
GOWN STRL REUS W/TWL XL LVL3 (GOWN DISPOSABLE) ×4
HEMOSTAT POWDER KIT SURGIFOAM (HEMOSTASIS) ×3 IMPLANT
KIT BASIN OR (CUSTOM PROCEDURE TRAY) ×3 IMPLANT
KIT TURNOVER KIT B (KITS) ×3 IMPLANT
NDL HYPO 25X1 1.5 SAFETY (NEEDLE) ×2 IMPLANT
NDL SPNL 20GX3.5 QUINCKE YW (NEEDLE) ×2 IMPLANT
NEEDLE HYPO 25X1 1.5 SAFETY (NEEDLE) ×2 IMPLANT
NEEDLE SPNL 20GX3.5 QUINCKE YW (NEEDLE) ×2 IMPLANT
NS IRRIG 1000ML POUR BTL (IV SOLUTION) ×3 IMPLANT
PACK LAMINECTOMY NEURO (CUSTOM PROCEDURE TRAY) ×3 IMPLANT
PAD ARMBOARD 7.5X6 YLW CONV (MISCELLANEOUS) ×10 IMPLANT
PIN DISTRACTION 14MM (PIN) ×6 IMPLANT
PLATE ACP INSIGNIA 40 2L (Plate) ×1 IMPLANT
SCREW VA SINGLE LEAD 4X14 (Screw) ×12 IMPLANT
SCREW VA SINGLE LEAD 4X14 ST (Screw) IMPLANT
SPACER ASSEM CERV LORD 7M (Spacer) ×2 IMPLANT
SPONGE INTESTINAL PEANUT (DISPOSABLE) ×3 IMPLANT
SPONGE SURGIFOAM ABS GEL SZ50 (HEMOSTASIS) ×2 IMPLANT
STRIP CLOSURE SKIN 1/2X4 (GAUZE/BANDAGES/DRESSINGS) ×3 IMPLANT
SUT VIC AB 3-0 SH 8-18 (SUTURE) ×6 IMPLANT
SUT VICRYL 4-0 PS2 18IN ABS (SUTURE) ×1 IMPLANT
TOWEL GREEN STERILE (TOWEL DISPOSABLE) ×3 IMPLANT
TOWEL GREEN STERILE FF (TOWEL DISPOSABLE) ×3 IMPLANT
WATER STERILE IRR 1000ML POUR (IV SOLUTION) ×3 IMPLANT

## 2021-09-30 NOTE — Transfer of Care (Signed)
Immediate Anesthesia Transfer of Care Note  Patient: [name] [name]  Procedure(s) Performed: Anterior Cervical Discectomy Fusion - Cervical Five - Cervical Six Cervical six - Cervical seven (Spine Cervical)  Patient Location: PACU  Anesthesia Type:General  Level of Consciousness: awake, alert  and oriented  Airway & Oxygen Therapy: Patient Spontanous Breathing and Patient connected to face mask oxygen  Post-op Assessment: Report given to RN and Post -op Vital signs reviewed and stable  Post vital signs: Reviewed and stable  Last Vitals:  Vitals Value Taken Time  BP    Temp    Pulse 80 09/30/21 1216  Resp    SpO2 100 % 09/30/21 1216  Vitals shown include unvalidated device data.  Last Pain:  Vitals:   09/30/21 0909  TempSrc:   PainSc: 7       Patients Stated Pain Goal: 1 (09/30/21 0909)  Complications: No notable events documented.

## 2021-09-30 NOTE — H&P (Signed)
Subjective:   Patient is a 66 y.o. female admitted for cervical radiculopathy. The patient first presented to me with complaints of neck pain, shooting pains in the arm(s), and numbness of the arm(s). Onset of symptoms was several months ago. The pain is described as aching and occurs all day. The pain is rated severe, and is located in the neck and radiates to the RUE. The symptoms have been progressive. Symptoms are exacerbated by extending head backwards, and are relieved by none.  Previous work up includes MRI of cervical spine, results: spinal stenosis.  Past Medical History:  Diagnosis Date   Anxiety    Deafness    Wears Bilat hearing aids since Childhood   Environmental and seasonal allergies    Glaucoma, both eyes    Hyperlipidemia    Major depressive disorder    Mild cognitive impairment, so stated 07/16/2013   Patient subjectively complains of word finding and memory deficits. MOCA 30-30 on 07-16-13 , paraphrasic errors- uses words such as  closet for garage    OA (osteoarthritis)    "all over"   Obstructive sleep apnea treated with BiPAP    Right arm weakness    upper due to shoulder issues   Wears glasses    Wears hearing aid in both ears     Past Surgical History:  Procedure Laterality Date   ABDOMINAL HERNIA REPAIR     ABDOMINAL HYSTERECTOMY     BILATERAL CATARACT SURGERY      FRACTURE SURGERY Right    Foot   JOINT REPLACEMENT Bilateral    Knees   JOINT REPLACEMENT Bilateral 2020   Hips   KNEE ARTHROSCOPY W/ SYNOVECTOMY Left 07-20-2010  dr Lequita Halt  [facility]   LAPAROSCOPIC VAGINAL HYSTERECTOMY WITH SALPINGO OOPHORECTOMY Bilateral 09-25-2005   dr Renaldo Fiddler  [facility]   REVERSE SHOULDER ARTHROPLASTY Left 05/20/2020   Procedure: REVERSE SHOULDER ARTHROPLASTY;  Surgeon: Ernest Mallick, MD;  Location: [facility] OR;  Service: Orthopedics;  Laterality: Left;    REVERSE SHOULDER ARTHROPLASTY Right 05/12/2021   Procedure: REVERSE SHOULDER ARTHROPLASTY;  Surgeon: Francena Hanly, MD;   Location: [facility] ORS;  Service: Orthopedics;  Laterality: Right;    RIGHT ANKLE TENDON REPAIR  12-21-2008   dr Simonne Come  [facility]   TOTAL HIP ARTHROPLASTY Left 09/24/2017   Procedure: LEFT TOTAL HIP ARTHROPLASTY ANTERIOR APPROACH;  Surgeon: Ollen Gross, MD;  Location: [facility] OR[facility];  Service: Orthopedics;  Laterality: Left;   TOTAL HIP ARTHROPLASTY Right 12/19/2017   Procedure: RIGHT TOTAL HIP ARTHROPLASTY ANTERIOR APPROACH;  Surgeon: Ollen Gross, MD;  Location: [facility];  Service: Orthopedics;  Laterality: Right;   TOTAL KNEE ARTHROPLASTY Left 11/21/2013   Procedure: LEFT TOTAL KNEE  ARTHROPLASTY REVISION;  Surgeon: Loanne Drilling, MD;  Location: [facility];  Service: Orthopedics;  Laterality: Left;   TOTAL KNEE ARTHROPLASTY Bilateral 06-11-2006   dr Lequita Halt  [facility]    Allergies  Allergen Reactions   Aleve [Naproxen]     bruising   Gramineae Pollens    Statins Other (See Comments)    Causing liver damage     Social History   Tobacco Use   Smoking status: Some Days    Years: 12.00    Types: Cigarettes   Smokeless tobacco: Never  Substance Use Topics   Alcohol use: Yes    Alcohol/week: 7.0 standard drinks    Types: 7 Glasses of wine per week    Comment: drinks one glass of wine daily    Family History  Problem  Relation Age of Onset   Depression Father    Depression Paternal Grandmother    Depression Mother    Alcohol abuse Mother    Depression Brother    Alcohol abuse Brother    Prior to Admission medications   Medication Sig Start Date End Date Taking? Authorizing Provider  buPROPion (WELLBUTRIN XL) 150 MG 24 hr tablet Take 1 tablet (150 mg total) by mouth every morning. 08/10/21  Yes Arfeen, Phillips Grout, MD  ezetimibe (ZETIA) 10 MG tablet Take 10 mg by mouth daily.   Yes [provider]  HYDROcodone-acetaminophen (NORCO) 10-325 MG tablet Take 1 tablet by mouth 3 (three) times daily as needed for moderate pain.   Enrigue Catena, MD  lamoTRIgine (LAMICTAL) 200 MG tablet  Take 1 tablet (200 mg total) by mouth daily. 08/10/21  Yes Arfeen, Phillips Grout, MD  latanoprost (XALATAN) 0.005 % ophthalmic solution Place 1 drop into both eyes at bedtime. 04/20/20  Yes [provider]  meloxicam (MOBIC) 15 MG tablet Take 1 tablet (15 mg total) by mouth daily. 05/12/21 05/12/22 Yes Shuford, French Ana, PA-C  Omega-3 Fatty Acids (FISH OIL PO) Take 1 capsule by mouth daily.   Yes [provider]  timolol (TIMOPTIC) 0.5 % ophthalmic solution Place 1 drop into both eyes in the morning. 06/21/15  Yes [provider]  venlafaxine XR (EFFEXOR-XR) 150 MG 24 hr capsule Take 1 capsule (150 mg total) by mouth daily with breakfast. 08/10/21  Yes Arfeen, Phillips Grout, MD  zolpidem (AMBIEN) 5 MG tablet Take 1 tablet (5 mg total) by mouth at bedtime as needed for sleep. 08/10/21  Yes Arfeen, Phillips Grout, MD  cholecalciferol (VITAMIN D3) 25 MCG (1000 UNIT) tablet Take 1,000 Units by mouth daily. Patient not taking: Reported on 09/23/2021    [provider]  ondansetron (ZOFRAN) 4 MG tablet Take 1 tablet (4 mg total) by mouth every 8 (eight) hours as needed for nausea or vomiting. Patient not taking: Reported on 09/23/2021 05/12/21   Shuford, French Ana, PA-C     Review of Systems  Positive ROS: neg  All other systems have been reviewed and were otherwise negative with the exception of those mentioned in the HPI and as above.  Objective: Vital signs in last 24 hours: Temp:  [98.3 F (36.8 C)] 98.3 F (36.8 C) (02/24 0820) Pulse Rate:  [85] 85 (02/24 0820) Resp:  [18] 18 (02/24 0820) BP: (159)/(85) 159/85 (02/24 0820) SpO2:  [95 %] 95 % (02/24 0820) Weight:  [75.3 kg] 75.3 kg (02/24 0820)  General Appearance: Alert, cooperative, no distress, appears stated age Head: Normocephalic, without obvious abnormality, atraumatic Eyes: PERRL, conjunctiva/corneas clear, EOM's intact      Neck: Supple, symmetrical, trachea midline, Back: Symmetric, no curvature, ROM normal, no CVA  tenderness Lungs:  respirations unlabored Heart: Regular rate and rhythm Abdomen: Soft, non-tender Extremities: Extremities normal, atraumatic, no cyanosis or edema Pulses: 2+ and symmetric all extremities Skin: Skin color, texture, turgor normal, no rashes or lesions  NEUROLOGIC:  Mental status: Alert and oriented x4, no aphasia, good attention span, fund of knowledge and memory  Motor Exam - grossly normal Sensory Exam - grossly normal Reflexes: 1+ Coordination - grossly normal Gait - grossly normal Balance - grossly normal Cranial Nerves: I: smell Not tested  II: visual acuity  OS: nl    OD: nl  II: visual fields Full to confrontation  II: pupils Equal, round, reactive to light  III,VII: ptosis None  III,IV,VI: extraocular muscles  Full  ROM  V: mastication Normal  V: facial light touch sensation  Normal  V,VII: corneal reflex  Present  VII: facial muscle function - upper  Normal  VII: facial muscle function - lower Normal  VIII: hearing Not tested  IX: soft palate elevation  Normal  IX,X: gag reflex Present  XI: trapezius strength  5/5  XI: sternocleidomastoid strength 5/5  XI: neck flexion strength  5/5  XII: tongue strength  Normal    Data Review Lab Results  Component Value Date   WBC 6.3 09/27/2021   HGB 12.8 09/27/2021   HCT 39.6 09/27/2021   MCV 98.3 09/27/2021   PLT 287 09/27/2021   Lab Results  Component Value Date   NA 137 09/27/2021   K 3.8 09/27/2021   CL 100 09/27/2021   CO2 27 09/27/2021   BUN 12 09/27/2021   CREATININE 0.69 09/27/2021   GLUCOSE 92 09/27/2021   Lab Results  Component Value Date   INR 0.9 09/27/2021    Assessment:   Cervical neck pain with herniated nucleus pulposus/ spondylosis/ stenosis at C5-6 C6-7. Estimated body mass index is 32.42 kg/m as calculated from the following:   Height as of this encounter: 5' (1.524 m).   Weight as of this encounter: 75.3 kg.  Patient has failed conservative therapy. Planned surgery :  ACDF with plate E8-3 T5-1  Plan:   I explained the condition and procedure to the patient and answered any questions.  Patient wishes to proceed with procedure as planned. Understands risks/ benefits/ and expected or typical outcomes.  Tia Alert 09/30/2021 8:28 AM

## 2021-09-30 NOTE — Anesthesia Preprocedure Evaluation (Addendum)
Anesthesia Evaluation  Patient identified by MRN, date of birth, ID band Patient awake    Reviewed: Allergy & Precautions, NPO status , Patient's Chart, lab work & pertinent test results  Airway Mallampati: III  TM Distance: >3 FB Neck ROM: Full    Dental  (+) Teeth Intact, Dental Advisory Given   Pulmonary sleep apnea , Current Smoker and Patient abstained from smoking.,    breath sounds clear to auscultation       Cardiovascular negative cardio ROS   Rhythm:Regular Rate:Normal     Neuro/Psych PSYCHIATRIC DISORDERS Anxiety Depression    GI/Hepatic negative GI ROS, Neg liver ROS,   Endo/Other  negative endocrine ROS  Renal/GU negative Renal ROS     Musculoskeletal  (+) Arthritis ,   Abdominal Normal abdominal exam  (+)   Peds  Hematology negative hematology ROS (+)   Anesthesia Other Findings   Reproductive/Obstetrics                            Anesthesia Physical Anesthesia Plan  ASA: 2  Anesthesia Plan: General   Post-op Pain Management:    Induction: Intravenous  PONV Risk Score and Plan: 3 and Ondansetron, Dexamethasone and Midazolam  Airway Management Planned: Oral ETT and Video Laryngoscope Planned  Additional Equipment: None  Intra-op Plan:   Post-operative Plan: Extubation in OR  Informed Consent:   Plan Discussed with: CRNA  Anesthesia Plan Comments:         Anesthesia Quick Evaluation

## 2021-09-30 NOTE — Op Note (Signed)
09/30/2021  12:08 PM  PATIENT:  [name] [name]  66 y.o. female  PRE-OPERATIVE DIAGNOSIS: Cervical spondylosis with degenerative disc disease and foraminal stenosis C5-6 C6-7, neck pain with radiculopathy  POST-OPERATIVE DIAGNOSIS:  same  PROCEDURE:  1. Decompressive anterior cervical discectomy C5-6 C6-7, 2. Anterior cervical arthrodesis C5-6 C6-7 utilizing a cortical cancellus allograft bone graft, 3. Anterior cervical plating C5-6 C6-7 utilizing a Alphatec plate  SURGEON:  Marikay Alar, MD  ASSISTANTS: Dr. Conchita Paris  ANESTHESIA:   General  EBL: 25 ml  Total I/O In: 250 [IV Piggyback:250] Out: -   BLOOD ADMINISTERED: none  DRAINS: none  SPECIMEN:  none  INDICATION FOR PROCEDURE: This patient presented with neck pain with right arm pain. Imaging showed several spondylosis C5-6 C6-7 with foraminal stenosis. The patient tried conservative measures without relief. Pain was debilitating. Recommended ACDF with plating. Patient understood the risks, benefits, and alternatives and potential outcomes and wished to proceed.  PROCEDURE DETAILS: Patient was brought to the operating room placed under general endotracheal anesthesia. Patient was placed in the supine position on the operating room table. The neck was prepped with Duraprep and draped in a sterile fashion.   Three cc of local anesthesia was injected and a transverse incision was made on the right side of the neck.  Dissection was carried down thru the subcutaneous tissue and the platysma was  elevated, opened, and undermined with Metzenbaum scissors.  Dissection was then carried out thru an avascular plane leaving the sternocleidomastoid carotid artery and jugular vein laterally and the trachea and esophagus medially with the assistance of my nurse practitioner. The ventral aspect of the vertebral column was identified and a localizing x-ray was taken. The C5-6 level was identified and all in the room agreed with the level. The longus  colli muscles were then elevated and the retractor was placed with the assistance of my nurse practitioner to expose C5-6 and C6-7. The annulus was incised and the disc space entered. Discectomy was performed with micro-curettes and pituitary rongeurs. I then used the high-speed drill to drill the endplates down to the level of the posterior longitudinal ligament.  The operating microscope was draped and brought into the field provided additional magnification, illumination and visualization. Discectomy was continued posteriorly thru the disc space. Posterior longitudinal ligament was opened with a nerve hook, and then removed along with disc herniation and osteophytes, decompressing the spinal canal and thecal sac. We then continued to remove osteophytic overgrowth and disc material decompressing the neural foramina and exiting nerve roots bilaterally. The scope was angled up and down to help decompress and undercut the vertebral bodies. Once the decompression was completed we could pass a nerve hook circumferentially to assure adequate decompression in the midline and in the neural foramina. So by both visualization and palpation we felt we had an adequate decompression of the neural elements. We then measured the height of the intravertebral disc space and selected a 7 millimeter cortical cancellus allograft. It was then gently positioned in the intravertebral disc space(s) and countersunk at both levels. I then used a 40 mm Alphatec plate and placed 14 mm variable angle screws into the vertebral bodies of each level C5-C6 and C6-7 and locked them into position.  Dr. Conchita Paris was available for the plating.  The wound was irrigated with bacitracin solution, checked for hemostasis which was established and confirmed. Once meticulous hemostasis was achieved, we then proceeded with closure with the assistance of my nurse practitioner. The platysma was closed with interrupted  3-0 undyed Vicryl suture, the  subcuticular layer was closed with interrupted 3-0 undyed Vicryl suture. The skin edges were approximated with steristrips. The drapes were removed. A sterile dressing was applied. The patient was then awakened from general anesthesia and transferred to the recovery room in stable condition. At the end of the procedure all sponge, needle and instrument counts were correct.   PLAN OF CARE: Admit for overnight observation  PATIENT DISPOSITION:  PACU - hemodynamically stable.   Delay start of Pharmacological VTE agent (>24hrs) due to surgical blood loss or risk of bleeding:  yes

## 2021-09-30 NOTE — Anesthesia Postprocedure Evaluation (Signed)
Anesthesia Post Note  Patient: [name] [name]  Procedure(s) Performed: Anterior Cervical Discectomy Fusion - Cervical Five - Cervical Six Cervical six - Cervical seven (Spine Cervical)     Patient location during evaluation: PACU Anesthesia Type: General Level of consciousness: awake and alert Pain management: pain level controlled Vital Signs Assessment: post-procedure vital signs reviewed and stable Respiratory status: spontaneous breathing, nonlabored ventilation, respiratory function stable and patient connected to nasal cannula oxygen Cardiovascular status: blood pressure returned to baseline and stable Postop Assessment: no apparent nausea or vomiting Anesthetic complications: no   No notable events documented.  Last Vitals:  Vitals:   09/30/21 1430 09/30/21 1500  BP: 133/78 (!) 150/78  Pulse: 78 85  Resp: 12 16  Temp:  36.9 C  SpO2: 97% 97%    Last Pain:  Vitals:   09/30/21 1500  TempSrc: Oral  PainSc:                  [name]

## 2021-09-30 NOTE — Anesthesia Procedure Notes (Signed)
Procedure Name: Intubation Date/Time: 09/30/2021 10:04 AM Performed by: Lorie Phenix, CRNA Pre-anesthesia Checklist: Patient identified, Emergency Drugs available, Suction available and Patient being monitored Patient Re-evaluated:Patient Re-evaluated prior to induction Oxygen Delivery Method: Circle system utilized Preoxygenation: Pre-oxygenation with 100% oxygen Induction Type: IV induction Ventilation: Mask ventilation without difficulty Laryngoscope Size: Glidescope and 3 Grade View: Grade I Tube type: Oral Tube size: 7.5 mm Number of attempts: 1 Airway Equipment and Method: Rigid stylet Placement Confirmation: ETT inserted through vocal cords under direct vision, positive ETCO2 and breath sounds checked- equal and bilateral Secured at: 22 cm Tube secured with: Tape Dental Injury: Teeth and Oropharynx as per pre-operative assessment  Comments: Elective video laryngoscopy d/t neck instability

## 2021-10-01 DIAGNOSIS — M4722 Other spondylosis with radiculopathy, cervical region: Secondary | ICD-10-CM | POA: Diagnosis not present

## 2021-10-01 MED ORDER — HYDROCODONE-ACETAMINOPHEN 10-325 MG PO TABS: 1.0000 | ORAL_TABLET | ORAL | 0 refills | Status: AC | PRN

## 2021-10-01 MED ORDER — METHOCARBAMOL 500 MG PO TABS: 500.0000 mg | ORAL_TABLET | Freq: Four times a day (QID) | ORAL | 0 refills | Status: AC | PRN

## 2021-10-01 NOTE — Care Management (Signed)
No TOC needs identifed. Unit staff to provide any DME needed.

## 2021-10-01 NOTE — Progress Notes (Signed)
Patient alert and oriented, mae's well, voiding adequate amount of urine, swallowing without difficulty, no c/o pain at time of discharge. Patient discharged home with family. Script and discharged instructions given to patient. Patient stated understanding of instructions given. Patient has an appointment with Dr. Ronnald Ramp in 2 weeks

## 2021-10-01 NOTE — Discharge Summary (Signed)
Physician Discharge Summary  Patient ID: [name] [name] MRN: [id number] DOB/AGE: [id number] y.o.  Admit date: 09/30/2021 Discharge date: 10/01/2021  Admission Diagnoses: Cervical spondylosis with radiculopathy  Discharge Diagnoses: Same Principal Problem:   S/P cervical spinal fusion   Discharged Condition: Stable  Hospital Course:  [name] [name] is a 66 y.o. female electively admitted after ACDF. Postoperatively, the patient was at neurologic baseline, reporting improvement in arm pain. She was tolerating diet, ambulating independently, voiding normally, with pain controlled with oral medication.  Treatments: Surgery - ACDF C5-6 C6-7  Discharge Exam: Blood pressure 138/80, pulse 80, temperature 98.4 F (36.9 C), temperature source Oral, resp. rate 16, height 5' (1.524 m), weight 75.3 kg, SpO2 99 %. Awake, alert, oriented Speech fluent, appropriate CN grossly intact 5/5 BUE/BLE Wound c/d/i  Disposition: Discharge disposition: 01-Home or Self Care       Discharge Instructions     Call MD for:  redness, tenderness, or signs of infection (pain, swelling, redness, odor or green/yellow discharge around incision site)   Complete by: As directed    Call MD for:  temperature >100.4   Complete by: As directed    Diet - low sodium heart healthy   Complete by: As directed    Discharge instructions   Complete by: As directed    Walk at home as much as possible, at least 4 times / day   Increase activity slowly   Complete by: As directed    Lifting restrictions   Complete by: As directed    No lifting > 10 lbs   May shower / Bathe   Complete by: As directed    48 hours after surgery   May walk up steps   Complete by: As directed    Other Restrictions   Complete by: As directed    No bending/twisting at waist   Remove dressing in 24 hours   Complete by: As directed       Allergies as of 10/01/2021       Reactions   Aleve [naproxen]    bruising    Gramineae Pollens    Statins Other (See Comments)   Causing liver damage         Medication List     STOP taking these medications    cholecalciferol 25 MCG (1000 UNIT) tablet Commonly known as: VITAMIN D3   ondansetron 4 MG tablet Commonly known as: Zofran       TAKE these medications    buPROPion 150 MG 24 hr tablet Commonly known as: WELLBUTRIN XL Take 1 tablet (150 mg total) by mouth every morning.   ezetimibe 10 MG tablet Commonly known as: ZETIA Take 10 mg by mouth daily.   FISH OIL PO Take 1 capsule by mouth daily.   HYDROcodone-acetaminophen 10-325 MG tablet Commonly known as: NORCO Take 1 tablet by mouth every 4 (four) hours as needed for up to 7 days for moderate pain. What changed: when to take this   lamoTRIgine 200 MG tablet Commonly known as: LAMICTAL Take 1 tablet (200 mg total) by mouth daily.   latanoprost 0.005 % ophthalmic solution Commonly known as: XALATAN Place 1 drop into both eyes at bedtime.   meloxicam 15 MG tablet Commonly known as: MOBIC Take 1 tablet (15 mg total) by mouth daily.   methocarbamol 500 MG tablet Commonly known as: ROBAXIN Take 1 tablet (500 mg total) by mouth every 6 (six) hours as needed for muscle spasms.   timolol 0.5 %  ophthalmic solution Commonly known as: TIMOPTIC Place 1 drop into both eyes in the morning.   venlafaxine XR 150 MG 24 hr capsule Commonly known as: EFFEXOR-XR Take 1 capsule (150 mg total) by mouth daily with breakfast.   zolpidem 5 MG tablet Commonly known as: AMBIEN Take 1 tablet (5 mg total) by mouth at bedtime as needed for sleep.        Follow-up Information     Tia Alert, MD Follow up in 3 week(s).   Specialty: Neurosurgery Contact information: [address] [place] [address] [phone]                 Signed: Jackelyn Hoehn 10/01/2021, 9:46 AM

## 2021-10-01 NOTE — Evaluation (Addendum)
Occupational Therapy Evaluation and Discharge Patient Details Name: [name] [name] MRN: [id number] DOB: 1956-06-04 Today's Date: 10/01/2021   History of Present Illness [name] [name] is a 66 yo female s/p decompressive anterior cervical discectomy C5-6 C6-7; Anterior cervical arthrodesis C5-6 C6-7 3; Anterior cervical plating C5-6 C6-7.   Clinical Impression   [name] admitted and underwent above presents to acute OT with all education completed, we will D/C from acute OT. Pt given a soft collar due to moving her neck a lot and stating it is painful when she does this, but she forgets not to do move her neck.     Recommendations for follow up therapy are one component of a multi-disciplinary discharge planning process, led by the attending physician.  Recommendations may be updated based on patient status, additional functional criteria and insurance authorization.   Follow Up Recommendations  No OT follow up    Assistance Recommended at Discharge PRN  Patient can return home with the following Assistance with cooking/housework;Assist for transportation    Functional Status Assessment  Patient has had a recent decline in their functional status and demonstrates the ability to make significant improvements in function in a reasonable and predictable amount of time. (does not need follow up therapy)  Equipment Recommendations  None recommended by OT       Precautions / Restrictions Precautions Precautions: Cervical Precaution Booklet Issued: Yes (comment) Required Braces or Orthoses: Cervical Brace Cervical Brace: Soft collar Restrictions Weight Bearing Restrictions: No      Mobility Bed Mobility Overal bed mobility: Modified Independent                  Transfers Overall transfer level: Independent                        Balance Overall balance assessment: Mild deficits observed, not formally tested                                          ADL either performed or assessed with clinical judgement   ADL Overall ADL's : Modified independent                                       General ADL Comments: Educated on all the modifications for basic ADLs     Vision Baseline Vision/History: 1 Wears glasses Ability to See in Adequate Light: 0 Adequate Patient Visual Report: No change from baseline              Pertinent Vitals/Pain Pain Assessment Pain Assessment: Faces Faces Pain Scale: Hurts a little bit Pain Location: neck with moving too much Pain Descriptors / Indicators: Aching, Sore Pain Intervention(s): Limited activity within patient's tolerance     Hand Dominance Right   Extremity/Trunk Assessment Upper Extremity Assessment Upper Extremity Assessment: Overall WFL for tasks assessed           Communication Communication Communication: HOH   Cognition Arousal/Alertness: Awake/alert Behavior During Therapy: WFL for tasks assessed/performed Overall Cognitive Status: Within Functional Limits for tasks assessed  Home Living Family/patient expects to be discharged to:: Private residence Living Arrangements: Other relatives Available Help at Discharge: Family;Available 24 hours/day Type of Home: House                                          OT Problem List: Decreased range of motion;Pain         OT Goals(Current goals can be found in the care plan section) Acute Rehab OT Goals Patient Stated Goal: home today         AM-PAC OT "6 Clicks" Daily Activity     Outcome Measure Help from another person eating meals?: None Help from another person taking care of personal grooming?: None Help from another person toileting, which includes using toliet, bedpan, or urinal?: None Help from another person bathing (including washing, rinsing, drying)?: None Help from another person to put on and  taking off regular upper body clothing?: None Help from another person to put on and taking off regular lower body clothing?: None 6 Click Score: 24   End of Session Equipment Utilized During Treatment: Cervical collar Nurse Communication:  (no needs)  Activity Tolerance: Patient tolerated treatment well Patient left: in chair (eating breakfast)  OT Visit Diagnosis: Pain Pain - part of body:  (neck)                Time: 7619-5093 OT Time Calculation (min): 35 min Charges:  OT General Charges $OT Visit: 1 Visit OT Evaluation $OT Eval Moderate Complexity: 1 Mod OT Treatments $Self Care/Home Management : 8-22 mins  Ignacia Palma, OTR/L Acute [employer] Pager [phone] Office [phone]    Evette Georges 10/01/2021, 8:46 AM

## 2021-10-01 NOTE — Progress Notes (Signed)
Orthopedic Tech Progress Note Patient Details:  [name] [name] [birth year] [id number] Soft Collar was given to PT for application  Ortho Devices Type of Ortho Device: Soft collar Ortho Device/Splint Location: Neck Ortho Device/Splint Interventions: Ordered      Donnalee Cellucci E Anaija Wissink 10/01/2021, 8:05 AM

## 2021-10-03 ENCOUNTER — Encounter (HOSPITAL_COMMUNITY): Payer: Self-pay | Admitting: Neurological Surgery

## 2021-10-03 MED FILL — Thrombin (Recombinant) For Soln 5000 Unit: CUTANEOUS | Qty: 5000 | Status: AC

## 2021-10-19 ENCOUNTER — Other Ambulatory Visit (HOSPITAL_COMMUNITY): Payer: Self-pay | Admitting: Psychiatry

## 2021-10-19 DIAGNOSIS — F330 Major depressive disorder, recurrent, mild: Secondary | ICD-10-CM

## 2021-10-19 DIAGNOSIS — F5101 Primary insomnia: Secondary | ICD-10-CM

## 2021-10-20 ENCOUNTER — Encounter (HOSPITAL_COMMUNITY): Payer: Self-pay | Admitting: Neurological Surgery

## 2021-10-21 ENCOUNTER — Other Ambulatory Visit (HOSPITAL_COMMUNITY): Payer: Self-pay | Admitting: Psychiatry

## 2021-10-21 DIAGNOSIS — F5101 Primary insomnia: Secondary | ICD-10-CM

## 2021-10-21 DIAGNOSIS — F330 Major depressive disorder, recurrent, mild: Secondary | ICD-10-CM

## 2021-11-04 ENCOUNTER — Other Ambulatory Visit (HOSPITAL_COMMUNITY): Payer: Self-pay | Admitting: Psychiatry

## 2021-11-04 DIAGNOSIS — F330 Major depressive disorder, recurrent, mild: Secondary | ICD-10-CM

## 2021-11-09 ENCOUNTER — Encounter (HOSPITAL_COMMUNITY): Payer: Self-pay | Admitting: Psychiatry

## 2021-11-09 ENCOUNTER — Telehealth (HOSPITAL_BASED_OUTPATIENT_CLINIC_OR_DEPARTMENT_OTHER): Payer: Medicare Other | Admitting: Psychiatry

## 2021-11-09 VITALS — Wt 165.0 lb

## 2021-11-09 DIAGNOSIS — F411 Generalized anxiety disorder: Secondary | ICD-10-CM

## 2021-11-09 DIAGNOSIS — F330 Major depressive disorder, recurrent, mild: Secondary | ICD-10-CM

## 2021-11-09 MED ORDER — BUPROPION HCL ER (XL) 150 MG PO TB24: 150.0000 mg | ORAL_TABLET | Freq: Every morning | ORAL | 0 refills | Status: DC

## 2021-11-09 MED ORDER — VENLAFAXINE HCL ER 150 MG PO CP24: 150.0000 mg | ORAL_CAPSULE | Freq: Every day | ORAL | 0 refills | Status: DC

## 2021-11-09 MED ORDER — LAMOTRIGINE 200 MG PO TABS: 200.0000 mg | ORAL_TABLET | Freq: Every day | ORAL | 0 refills | Status: DC

## 2021-11-09 NOTE — Progress Notes (Signed)
Virtual Visit via Telephone Note ? ?I connected with [name] [name] on 11/09/21 at  2:40 PM EDT by telephone and verified that I am speaking with the correct person using two identifiers. ? ?Location: ?Patient: In Car ?Provider: Home Office ?  ?I discussed the limitations, risks, security and privacy concerns of performing an evaluation and management service by telephone and the availability of in person appointments. I also discussed with the patient that there may be a patient responsible charge related to this service. The patient expressed understanding and agreed to proceed. ? ? ?History of Present Illness: ?Patient is evaluated by phone session.  Recently she had spinal surgery and she feels the numbness is much better but is still have a lot of pain and hoping it will get better.  She was not sleeping very well and her PCP refill the Ambien.  She reported her depression and anxiety is a stable and denies any recent crying spells or any feeling of hopelessness.  She does not feel overwhelmed or as anxious as she used to before.  She lives with her father who is supportive.  Sometimes she concerns about her father's health who has A-fib.  Patient denies any suicidal thoughts.  She started walking every day and she lost few pounds since the last visit.  She has pain medicine but she takes very rarely when she needed.  She like to keep the Lamictal, Effexor and Wellbutrin.  Her pain management is done by Dr. Sheran Luz.  Patient denies drinking or using any illegal substances. ? ?Past Psychiatric History: Reviewed. ?H/O inpatient in 2010 after brother committed suicide. On meds since 2008.  Took Prozac (manic), Paxil (did not help) and Abilify (headaches and diarrhea).  No h/o suicidal attempt or hallucinations.    ? ?Recent Results (from the past 2160 hour(s))  ?SARS CORONAVIRUS 2 (TAT 6-24 HRS) Nasopharyngeal Nasopharyngeal Swab     Status: None  ? Collection Time: 09/27/21  2:40 PM  ? Specimen:  Nasopharyngeal Swab  ?Result Value Ref Range  ? SARS Coronavirus 2 NEGATIVE NEGATIVE  ?  Comment: (NOTE) ?SARS-CoV-2 target nucleic acids are NOT DETECTED. ? ?The SARS-CoV-2 RNA is generally detectable in upper and lower ?respiratory specimens during the acute phase of infection. Negative ?results do not preclude SARS-CoV-2 infection, do not rule out ?co-infections with other pathogens, and should not be used as the ?sole basis for treatment or other patient management decisions. ?Negative results must be combined with clinical observations, ?patient history, and epidemiological information. The expected ?result is Negative. ? ?Fact Sheet for Patients: ?HairSlick.no ? ?Fact Sheet for Healthcare Providers: ?quierodirigir.com ? ?This test is not yet approved or cleared by the [place] FDA and  ?has been authorized for detection and/or diagnosis of SARS-CoV-2 by ?FDA under an Emergency Use Authorization (EUA). This EUA will remain  ?in effect (meaning this test can be used) for the duration of the ?COVID-19 declaration under Se ction 564(b)(1) of the Act, 21 U.S.C. ?section 360bbb-3(b)(1), unless the authorization is terminated or ?revoked sooner. ? ?Performed at [facility], [address] [place], [place] ?[address] ?  ?Surgical PCR Screen     Status: Abnormal  ? Collection Time: 09/27/21  2:50 PM  ? Specimen: Nasal Mucosa; Nasal Swab  ?Result Value Ref Range  ? MRSA, PCR NEGATIVE NEGATIVE  ? Staphylococcus aureus POSITIVE (A) NEGATIVE  ?  Comment: (NOTE) ?The Xpert SA Assay (FDA approved for NASAL specimens in patients 12 ?years of age and  older), is one component of a comprehensive ?surveillance program. It is not intended to diagnose infection nor to ?guide or monitor treatment. ?Performed at [facility], [address] [place], [place] ?[address] ?  ?Protime-INR     Status: None  ? Collection Time: 09/27/21  2:50 PM  ?Result Value Ref Range   ? Prothrombin Time 12.3 11.4 - 15.2 seconds  ? INR 0.9 0.8 - 1.2  ?  Comment: (NOTE) ?INR goal varies based on device and disease states. ?Performed at [facility], [address] [place], [place] ?[address] ?  ?CBC per protocol     Status: None  ? Collection Time: 09/27/21  2:50 PM  ?Result Value Ref Range  ? WBC 6.3 4.0 - 10.5 K/uL  ? RBC 4.03 3.87 - 5.11 MIL/uL  ? Hemoglobin 12.8 12.0 - 15.0 g/dL  ? HCT 39.6 36.0 - 46.0 %  ? MCV 98.3 80.0 - 100.0 fL  ? MCH 31.8 26.0 - 34.0 pg  ? MCHC 32.3 30.0 - 36.0 g/dL  ? RDW 12.3 11.5 - 15.5 %  ? Platelets 287 150 - 400 K/uL  ? nRBC 0.0 0.0 - 0.2 %  ?  Comment: Performed at [facility], [address] [place], [place] [address]  ?Basic metabolic panel per protocol     Status: None  ? Collection Time: 09/27/21  2:50 PM  ?Result Value Ref Range  ? Sodium 137 135 - 145 mmol/L  ? Potassium 3.8 3.5 - 5.1 mmol/L  ? Chloride 100 98 - 111 mmol/L  ? CO2 27 22 - 32 mmol/L  ? Glucose, Bld 92 70 - 99 mg/dL  ?  Comment: Glucose reference range applies only to samples taken after fasting for at least 8 hours.  ? BUN 12 8 - 23 mg/dL  ? Creatinine, Ser 0.69 0.44 - 1.00 mg/dL  ? Calcium 9.4 8.9 - 10.3 mg/dL  ? GFR, Estimated >60 >60 mL/min  ?  Comment: (NOTE) ?Calculated using the CKD-EPI Creatinine Equation (2021) ?  ? Anion gap 10 5 - 15  ?  Comment: Performed at [facility], [address] [place], [place] [address]  ?  ? ?Psychiatric Specialty Exam: ?Physical Exam  ?Review of Systems  ?Weight 166 lb (75.3 kg).There is no height or weight on file to calculate BMI.  ?General Appearance: NA  ?Eye Contact:  NA  ?Speech:  Clear and Coherent and Normal Rate  ?Volume:  Normal  ?Mood:  Euthymic  ?Affect:  NA  ?Thought Process:  Goal Directed  ?Orientation:  Full (Time, Place, and Person)  ?Thought Content:  WDL  ?Suicidal Thoughts:  No  ?Homicidal Thoughts:  No  ?Memory:  Immediate;   Good ?Recent;   Good ?Remote;   Fair  ?Judgement:  Intact  ?Insight:  Present   ?Psychomotor Activity:  NA  ?Concentration:  Concentration: Good and Attention Span: Good  ?Recall:  Good  ?Fund of Knowledge:  Good  ?Language:  Good  ?Akathisia:  No  ?Handed:  Right  ?AIMS (if indicated):     ?Assets:  Communication Skills ?Desire for Improvement ?Housing ?Resilience ?Social Support  ?ADL's:  Intact  ?Cognition:  WNL  ?Sleep:   Okay  ?  ? ? ?Assessment and Plan: ?Major depressive disorder, recurrent.  Anxiety.  Primary insomnia. ? ?Patient recently had spinal fusion and slowly and gradually getting better after the surgery.  Her numbness and pain is somewhat better.  Recently her  PCP given Ambien and she like to keep the refills of the Ambien from her PCP Dr. Tracey Harries.  Discussed medication side effects and benefits.  Continue Wellbutrin XL 150 mg daily, Effexor 150 mg daily and Lamictal 200 mg daily.  Recommended to call us back if she has any question or any concern.  Follow-up in 3 months. ? ?Follow Up Instructions: ?I discussed the assessment and treatment plan with the patient. The patient was provided an opportunity to ask questions and all were answered. The patient agreed with the plan and demonstrated an understanding of the instructions. ?  ?The patient was advised to call back or seek an in-person evaluation if the symptoms worsen or if the condition fails to improve as anticipated. ? ?Collaboration of Care: Primary Care Provider AEB notes are in epic to review ? ?Patient/Guardian was advised Release of Information must be obtained prior to any record release in order to collaborate their care with an outside provider. Patient/Guardian was advised if they have not already done so to contact the registration department to sign all necessary forms in order for [name] to release information regarding their care.  ? ?Consent: Patient/Guardian gives verbal consent for treatment and assignment of benefits for services provided during this visit. Patient/Guardian expressed understanding and  agreed to proceed.   ? ?I provided 25 minutes of non-face-to-face time during this encounter. ? ? ?Cleotis Nipper, MD  ?

## 2021-11-11 ENCOUNTER — Other Ambulatory Visit (HOSPITAL_COMMUNITY): Payer: Self-pay | Admitting: Psychiatry

## 2021-11-11 DIAGNOSIS — F330 Major depressive disorder, recurrent, mild: Secondary | ICD-10-CM

## 2021-11-11 DIAGNOSIS — F411 Generalized anxiety disorder: Secondary | ICD-10-CM

## 2022-02-09 ENCOUNTER — Telehealth (HOSPITAL_BASED_OUTPATIENT_CLINIC_OR_DEPARTMENT_OTHER): Payer: Medicare Other | Admitting: Psychiatry

## 2022-02-09 ENCOUNTER — Encounter (HOSPITAL_COMMUNITY): Payer: Self-pay | Admitting: Psychiatry

## 2022-02-09 DIAGNOSIS — F411 Generalized anxiety disorder: Secondary | ICD-10-CM

## 2022-02-09 DIAGNOSIS — F330 Major depressive disorder, recurrent, mild: Secondary | ICD-10-CM

## 2022-02-09 MED ORDER — VENLAFAXINE HCL ER 150 MG PO CP24: 150.0000 mg | ORAL_CAPSULE | Freq: Every day | ORAL | 0 refills | Status: DC

## 2022-02-09 MED ORDER — LAMOTRIGINE 200 MG PO TABS: 200.0000 mg | ORAL_TABLET | Freq: Every day | ORAL | 0 refills | Status: DC

## 2022-02-09 NOTE — Progress Notes (Signed)
Virtual Visit via Telephone Note  I connected with [name] [name] on 02/09/22 at  2:40 PM EDT by telephone and verified that I am speaking with the correct person using two identifiers.  Location: Patient: Home Provider: Home Office   I discussed the limitations, risks, security and privacy concerns of performing an evaluation and management service by telephone and the availability of in person appointments. I also discussed with the patient that there may be a patient responsible charge related to this service. The patient expressed understanding and agreed to proceed.   History of Present Illness: Patient is evaluated by phone session.  She is doing much better after the procedure of her neck.  She is pain-free and not taking any pain medication.  Her sleep is also improved and does not require Ambien.  She admitted sometime concerned about her father who is now very old and requires a lot of help from her.  Patient told she has to go for a few days from home and he was calling constantly needing some help.  Patient told father has multiple health issues including A-fib.  Patient denies any suicidal thoughts, mania, anger, agitation.  She is not taking any pain medicine.  She is taking Lamictal, Effexor and Wellbutrin.  Her appetite is okay.  She is more active.   Past Psychiatric History: Reviewed. H/O inpatient in 2010 after brother committed suicide. On meds since 2008.  Took Prozac (manic), Paxil (did not help) and Abilify (headaches and diarrhea).  No h/o suicidal attempt or hallucinations.      Psychiatric Specialty Exam: Physical Exam  Review of Systems  Weight 162 lb (73.5 kg).Body mass index is 31.64 kg/m.  General Appearance: NA  Eye Contact:  NA  Speech:  Normal Rate  Volume:  Normal  Mood:  Euthymic  Affect:  NA  Thought Process:  Goal Directed  Orientation:  Full (Time, Place, and Person)  Thought Content:  Logical  Suicidal Thoughts:  No  Homicidal Thoughts:  No   Memory:  Immediate;   Good Recent;   Good Remote;   Good  Judgement:  Intact  Insight:  Present  Psychomotor Activity:  NA  Concentration:  Concentration: Fair and Attention Span: Good  Recall:  Good  Fund of Knowledge:  Good  Language:  Good  Akathisia:  No  Handed:  Right  AIMS (if indicated):     Assets:  Communication Skills Desire for Improvement Housing Transportation  ADL's:  Intact  Cognition:  WNL  Sleep:   good      Assessment and Plan: Major depressive disorder, recurrent.  Anxiety.  Patient is not taking Ambien and she is doing much better in her sleep.  She is also not taking any pain medication.  I recommend trying to stop the Wellbutrin since she is doing much better.  Patient agreed to give a try.  We will continue Effexor 150 mg daily and lamotrigine 200 mg daily.  Recommended to call us back if she is any question or any concern.  Follow-up in 3 months.  Follow Up Instructions:    I discussed the assessment and treatment plan with the patient. The patient was provided an opportunity to ask questions and all were answered. The patient agreed with the plan and demonstrated an understanding of the instructions.   The patient was advised to call back or seek an in-person evaluation if the symptoms worsen or if the condition fails to improve as anticipated.  I provided 20 minutes  of non-face-to-face time during this encounter.   Kathlee Nations, MD

## 2022-02-22 ENCOUNTER — Emergency Department (HOSPITAL_COMMUNITY)
Admission: EM | Admit: 2022-02-22 | Discharge: 2022-02-22 | Disposition: A | Discharge status: Home / Self Care | Payer: Medicare Other | Attending: Emergency Medicine | Admitting: Emergency Medicine

## 2022-02-22 ENCOUNTER — Emergency Department (HOSPITAL_COMMUNITY): Payer: Medicare Other

## 2022-02-22 ENCOUNTER — Other Ambulatory Visit: Payer: Self-pay

## 2022-02-22 ENCOUNTER — Encounter (HOSPITAL_COMMUNITY): Payer: Self-pay | Admitting: Emergency Medicine

## 2022-02-22 DIAGNOSIS — S0990XA Unspecified injury of head, initial encounter: Secondary | ICD-10-CM | POA: Diagnosis present

## 2022-02-22 DIAGNOSIS — S0003XA Contusion of scalp, initial encounter: Secondary | ICD-10-CM | POA: Insufficient documentation

## 2022-02-22 DIAGNOSIS — Y92194 Driveway of other specified residential institution as the place of occurrence of the external cause: Secondary | ICD-10-CM | POA: Insufficient documentation

## 2022-02-22 DIAGNOSIS — W01198A Fall on same level from slipping, tripping and stumbling with subsequent striking against other object, initial encounter: Secondary | ICD-10-CM | POA: Insufficient documentation

## 2022-02-22 MED ORDER — ACETAMINOPHEN 500 MG PO TABS
1000.0000 mg | ORAL_TABLET | Freq: Once | ORAL | Status: AC
  Administered 2022-02-22: 1000 mg via ORAL
  Filled 2022-02-22: qty 2

## 2022-02-22 NOTE — ED Notes (Signed)
Patient transported to CT 

## 2022-02-22 NOTE — ED Provider Notes (Signed)
[facility] EMERGENCY DEPARTMENT Provider Note   CSN: [id number] Arrival date & time: 02/22/22  1636     History  Chief Complaint  Patient presents with   Sharolyn Douglas is a 66 y.o. female with history most significant for osteoarthritis with chronic pain presenting for evaluation of injury sustained in a fall.  She was at her sister's home helping to unload groceries from a vehicle when she lost her footing, fell backwards and struck her head on the driveway pavement.  Her sister witnessed the fall, there was no LOC and she denies nausea or vomiting, confusion since the event, this occurred approximately 2 hours before arrival.  She does endorse persistent headache and has developed a knot on her posterior scalp.  She also states she initially had blurred vision which is improved but not completely resolved at this time.  She denies focal weakness.  She has taken ibuprofen and her headache has improved but not completely resolved.  She is not on blood thinning medications.  The history is provided by the patient.       Home Medications Prior to Admission medications   Medication Sig Start Date End Date Taking? Authorizing Provider  buPROPion (WELLBUTRIN XL) 150 MG 24 hr tablet Take 1 tablet (150 mg total) by mouth every morning. Patient not taking: Reported on 02/09/2022 11/09/21   Arfeen, Phillips Grout, MD  ezetimibe (ZETIA) 10 MG tablet Take 10 mg by mouth daily.    [provider]  lamoTRIgine (LAMICTAL) 200 MG tablet Take 1 tablet (200 mg total) by mouth daily. 02/09/22   Arfeen, Phillips Grout, MD  latanoprost (XALATAN) 0.005 % ophthalmic solution Place 1 drop into both eyes at bedtime. 04/20/20   [provider]  meloxicam (MOBIC) 15 MG tablet Take 1 tablet (15 mg total) by mouth daily. Patient not taking: Reported on 02/09/2022 05/12/21 05/12/22  Shuford, French Ana, PA-C  methocarbamol (ROBAXIN) 500 MG tablet Take 1 tablet (500 mg total) by mouth every 6 (six) hours as needed for  muscle spasms. Patient not taking: Reported on 02/09/2022 10/01/21   Lisbeth Renshaw, MD  Omega-3 Fatty Acids (FISH OIL PO) Take 1 capsule by mouth daily.    [provider]  timolol (TIMOPTIC) 0.5 % ophthalmic solution Place 1 drop into both eyes in the morning. 06/21/15   [provider]  venlafaxine XR (EFFEXOR-XR) 150 MG 24 hr capsule Take 1 capsule (150 mg total) by mouth daily with breakfast. 02/09/22   Arfeen, Phillips Grout, MD  zolpidem (AMBIEN) 5 MG tablet Take by mouth. Patient not taking: Reported on 02/09/2022 11/07/21   Tracey Harries, MD      Allergies    Aleve [naproxen], Gramineae pollens, and Statins    Review of Systems   Review of Systems  Constitutional:  Negative for fever.  HENT:  Negative for congestion and sore throat.   Eyes:  Positive for visual disturbance.  Respiratory:  Negative for chest tightness and shortness of breath.   Cardiovascular:  Negative for chest pain.  Gastrointestinal:  Negative for abdominal pain, nausea and vomiting.  Genitourinary: Negative.   Musculoskeletal:  Negative for arthralgias, joint swelling and neck pain.  Skin: Negative.  Negative for rash and wound.  Neurological:  Positive for headaches. Negative for dizziness, seizures, syncope, weakness, light-headedness and numbness.  Psychiatric/Behavioral: Negative.    All other systems reviewed and are negative.   Physical Exam Updated Vital Signs BP (!) 131/54   Pulse 66  Temp 97.9 F (36.6 C) (Oral)   Resp 19   Ht 5' (1.524 m)   Wt 72.6 kg   SpO2 100%   BMI 31.25 kg/m  Physical Exam Vitals and nursing note reviewed.  Constitutional:      Appearance: She is well-developed.  HENT:     Head: Normocephalic.     Comments: Hematoma noted posterior parietal scalp.  There are no lacerations, faint speckled bruising over the site, no abrasion. Eyes:     General: Vision grossly intact. No visual field deficit.    Extraocular Movements: Extraocular movements intact.      Conjunctiva/sclera: Conjunctivae normal.     Pupils: Pupils are equal, round, and reactive to light.  Cardiovascular:     Rate and Rhythm: Normal rate and regular rhythm.     Heart sounds: Normal heart sounds.  Pulmonary:     Effort: Pulmonary effort is normal.     Breath sounds: Normal breath sounds. No wheezing.  Abdominal:     General: Bowel sounds are normal.     Palpations: Abdomen is soft.     Tenderness: There is no abdominal tenderness.  Musculoskeletal:        General: Normal range of motion.     Cervical back: Normal range of motion.  Skin:    General: Skin is warm and dry.  Neurological:     General: No focal deficit present.     Mental Status: She is alert and oriented to person, place, and time.     Cranial Nerves: No cranial nerve deficit.     Sensory: Sensation is intact.     Motor: Motor function is intact. No pronator drift.     Coordination: Coordination is intact. Rapid alternating movements normal.     Gait: Gait normal.     ED Results / Procedures / Treatments   Labs (all labs ordered are listed, but only abnormal results are displayed) Labs Reviewed - No data to display  EKG EKG Interpretation  Date/Time:  Wednesday February 22 2022 17:00:46 EDT Ventricular Rate:  57 PR Interval:  176 QRS Duration: 90 QT Interval:  440 QTC Calculation: 429 R Axis:   26 Text Interpretation: Sinus rhythm ST elevation inferior lead without reciprocal changes No significant change since last tracing Confirmed by Derwood Kaplan [phone]) on 02/22/2022 6:08:51 PM  Radiology CT Cervical Spine Wo Contrast  Result Date: 02/22/2022 CLINICAL DATA:  Larey Seat onto concrete, head injury, neck pain EXAM: CT CERVICAL SPINE WITHOUT CONTRAST TECHNIQUE: Multidetector CT imaging of the cervical spine was performed without intravenous contrast. Multiplanar CT image reconstructions were also generated. RADIATION DOSE REDUCTION: This exam was performed according to the departmental  dose-optimization program which includes automated exposure control, adjustment of the mA and/or kV according to patient size and/or use of iterative reconstruction technique. COMPARISON:  08/19/2021 FINDINGS: Alignment: Alignment is anatomic. Skull base and vertebrae: No acute fracture. No primary bone lesion or focal pathologic process. Soft tissues and spinal canal: No prevertebral fluid or swelling. No visible canal hematoma. Disc levels: Previous C5-C7 ACDF. Prominent facet hypertrophic changes from C2 through C4. Spondylosis most pronounced at C4-5 and T1-T2. Upper chest: Airway is patent.  Lung apices are clear. Other: Reconstructed images demonstrate no additional findings. IMPRESSION: 1. No acute cervical spine fracture. 2. Multilevel cervical degenerative changes, with previous C5-C7 ACDF as above. Electronically Signed   By: Sharlet Salina M.D.   On: 02/22/2022 18:21   CT Head Wo Contrast  Result Date: 02/22/2022 CLINICAL DATA:  Fell backwards onto concrete, head injury, headache and neck pain EXAM: CT HEAD WITHOUT CONTRAST TECHNIQUE: Contiguous axial images were obtained from the base of the skull through the vertex without intravenous contrast. RADIATION DOSE REDUCTION: This exam was performed according to the departmental dose-optimization program which includes automated exposure control, adjustment of the mA and/or kV according to patient size and/or use of iterative reconstruction technique. COMPARISON: 08/19/2021 MRI cervical spine FINDINGS: Brain: Scattered hypodensities throughout the periventricular and subcortical white matter most consistent with chronic small vessel ischemic changes. No evidence of acute infarct or hemorrhage. The lateral ventricles are unremarkable. No acute extra-axial fluid collections. No mass effect. Incidental note is made of a 0.6 x 0.6 by 0.6 cm hyperdense nodule in the suprasellar region. This lesion is unchanged since prior cervical spine MRI 08/19/2021,  corresponding to solid mass demonstrating decreased T2 and increased T1 signal. Vascular: Atherosclerosis of the internal carotid arteries. 0.6 cm hyperdense suprasellar lesion as above, differential includes solid mass versus aneurysm. Skull: Mild scalp hematoma along the posterior convexity. Negative for fracture or focal lesion. Sinuses/Orbits: No acute finding. Other: None. IMPRESSION: 1. Mild scalp hematoma along the convexity.  No underlying fracture. 2. No acute intracranial trauma. 3. Chronic small-vessel ischemic changes throughout the white matter. 4. Incidental 0.6 cm hyperdense nodule in the suprasellar region, unchanged since prior MRI cervical spine 08/19/2021. If not previously evaluated, dedicated nonemergent MRI pituitary protocol may be useful for definitive characterization. Electronically Signed   By: Sharlet Salina M.D.   On: 02/22/2022 18:19    Procedures Procedures    Medications Ordered in ED Medications  acetaminophen (TYLENOL) tablet 1,000 mg (1,000 mg Oral Given 02/22/22 1731)    ED Course/ Medical Decision Making/ A&P                           Medical Decision Making Patient with minor head injury, no LOC, CT imaging of her brain and C-spine are negative for acute trauma.  At time of discharge her blurred vision was nearly resolved by patient's report.  She was given minor head injury instructions and plans for recheck by her PCP within 1 week if symptoms are not completely resolved.  She had taken ibuprofen prior to arrival, added Tylenol after which her headache completely resolved.  Return precautions were discussed.  Also discussed incidental finding of nodule in the suprasellar region, she was advised to discuss this with her PCP and that a nonemergent MRI evaluating the pituitary gland would be recommended.  She acknowledged understanding of this and will discuss with Dr. Everlene Other.  Amount and/or Complexity of Data Reviewed Radiology: ordered.  Risk OTC  drugs.           Final Clinical Impression(s) / ED Diagnoses Final diagnoses:  Minor head injury, initial encounter  Hematoma of scalp, initial encounter    Rx / DC Orders ED Discharge Orders     None         Victoriano Lain 02/23/22 5027    Derwood Kaplan, MD 02/23/22 [phone]

## 2022-02-22 NOTE — ED Notes (Signed)
Pt d/c home with friend per EDP order. Discharge summary reviewed, pt verbalizes understanding. No s/s of acute distress noted at discharge.

## 2022-02-22 NOTE — ED Triage Notes (Signed)
Pt was unloading groceries and loss footings, falling backwards, striking head on cement, witness fall, denies LOC.

## 2022-05-11 ENCOUNTER — Encounter (HOSPITAL_COMMUNITY): Payer: Self-pay | Admitting: Psychiatry

## 2022-05-11 ENCOUNTER — Telehealth (HOSPITAL_BASED_OUTPATIENT_CLINIC_OR_DEPARTMENT_OTHER): Payer: Medicare Other | Admitting: Psychiatry

## 2022-05-11 DIAGNOSIS — F330 Major depressive disorder, recurrent, mild: Secondary | ICD-10-CM

## 2022-05-11 DIAGNOSIS — F411 Generalized anxiety disorder: Secondary | ICD-10-CM

## 2022-05-11 MED ORDER — VENLAFAXINE HCL ER 150 MG PO CP24: 150.0000 mg | ORAL_CAPSULE | Freq: Every day | ORAL | 0 refills | Status: DC

## 2022-05-11 NOTE — Progress Notes (Signed)
Virtual Visit via Telephone Note  I connected with [name] [name] on 05/11/22 at  4:00 PM EDT by telephone and verified that I am speaking with the correct person using two identifiers.  Location: Patient: Home Provider: Home Office   I discussed the limitations, risks, security and privacy concerns of performing an evaluation and management service by telephone and the availability of in person appointments. I also discussed with the patient that there may be a patient responsible charge related to this service. The patient expressed understanding and agreed to proceed.   History of Present Illness: Patient is evaluated by phone session.  She is doing well and we have discontinued Wellbutrin on the last visit.  Now she like to cut down further her medication as things are going very well.  She is spending more time with the father who is dementia is slowly and gradually worsening.  She denies any crying spells or any feeling of hopelessness or worthlessness.  She denies any panic attack.  She has no tremors, rash, itching or shakes.  She worried about her father who she takes her to the doctor's appointment and recently has a cardiology visit for A-fib.  Patient is active and wondering if he can cut down further medication.  Currently she is taking Lamictal and Effexor.  Very rare she takes Ambien because most of the time her sleep is good.  Past Psychiatric History: Reviewed. H/O inpatient in 2010 after brother committed suicide. On meds since 2008.  Took Prozac (manic), Paxil (did not help) and Abilify (headaches and diarrhea).  No h/o suicidal attempt or hallucinations.      Psychiatric Specialty Exam: Physical Exam  Review of Systems  Weight 160 lb (72.6 kg).There is no height or weight on file to calculate BMI.  General Appearance: NA  Eye Contact:  NA  Speech:  Normal Rate  Volume:  Normal  Mood:  Euthymic  Affect:  NA  Thought Process:  Goal Directed  Orientation:  Full (Time,  Place, and Person)  Thought Content:  WDL  Suicidal Thoughts:  No  Homicidal Thoughts:  No  Memory:  Immediate;   Good Recent;   Good Remote;   Good  Judgement:  Good  Insight:  Good  Psychomotor Activity:  NA  Concentration:  Concentration: Good and Attention Span: Good  Recall:  Good  Fund of Knowledge:  Good  Language:  Good  Akathisia:  No  Handed:  Right  AIMS (if indicated):     Assets:  Communication Skills Desire for Improvement Housing Transportation  ADL's:  Intact  Cognition:  WNL  Sleep:   ok      Assessment and Plan: Major depressive disorder, recurrent.  Anxiety.  Patient not taking Wellbutrin anymore and she like to cut down further medication.  She has been doing very well and taking care of her father who has dementia and slowly and gradually memory is worsening.  I recommend to try cutting down Lamictal from 200 mg to 100 mg for 2 weeks and then stop.  We will keep the Effexor at present dose and will consider lowering the dose on her next appointment.  I recommend to call us back if she feels worsening of symptoms.  Follow-up in 3 months.  Follow Up Instructions:    I discussed the assessment and treatment plan with the patient. The patient was provided an opportunity to ask questions and all were answered. The patient agreed with the plan and demonstrated an understanding of  the instructions.   The patient was advised to call back or seek an in-person evaluation if the symptoms worsen or if the condition fails to improve as anticipated.  Collaboration of Care: Other provider involved in patient's care AEB notes are available in epic to review.  Patient/Guardian was advised Release of Information must be obtained prior to any record release in order to collaborate their care with an outside provider. Patient/Guardian was advised if they have not already done so to contact the registration department to sign all necessary forms in order for [name] to release  information regarding their care.   Consent: Patient/Guardian gives verbal consent for treatment and assignment of benefits for services provided during this visit. Patient/Guardian expressed understanding and agreed to proceed.    I provided 17 minutes of non-face-to-face time during this encounter.   Kathlee Nations, MD

## 2022-08-11 ENCOUNTER — Telehealth (HOSPITAL_COMMUNITY): Payer: Medicare Other | Admitting: Psychiatry

## 2022-08-15 ENCOUNTER — Other Ambulatory Visit (HOSPITAL_COMMUNITY): Payer: Self-pay | Admitting: Psychiatry

## 2022-08-15 DIAGNOSIS — F330 Major depressive disorder, recurrent, mild: Secondary | ICD-10-CM

## 2022-08-21 ENCOUNTER — Other Ambulatory Visit (HOSPITAL_COMMUNITY): Payer: Self-pay | Admitting: Psychiatry

## 2022-08-21 DIAGNOSIS — F330 Major depressive disorder, recurrent, mild: Secondary | ICD-10-CM

## 2022-10-07 ENCOUNTER — Other Ambulatory Visit (HOSPITAL_COMMUNITY): Payer: Self-pay | Admitting: Psychiatry

## 2022-10-07 DIAGNOSIS — F330 Major depressive disorder, recurrent, mild: Secondary | ICD-10-CM

## 2022-10-07 DIAGNOSIS — F411 Generalized anxiety disorder: Secondary | ICD-10-CM

## 2022-10-19 ENCOUNTER — Other Ambulatory Visit (HOSPITAL_COMMUNITY): Payer: Self-pay | Admitting: Psychiatry

## 2022-10-19 DIAGNOSIS — F330 Major depressive disorder, recurrent, mild: Secondary | ICD-10-CM

## 2022-10-19 DIAGNOSIS — F411 Generalized anxiety disorder: Secondary | ICD-10-CM

## 2022-10-28 ENCOUNTER — Other Ambulatory Visit (HOSPITAL_COMMUNITY): Payer: Self-pay | Admitting: Psychiatry

## 2022-10-28 DIAGNOSIS — F411 Generalized anxiety disorder: Secondary | ICD-10-CM

## 2022-10-28 DIAGNOSIS — F330 Major depressive disorder, recurrent, mild: Secondary | ICD-10-CM

## 2023-08-15 ENCOUNTER — Ambulatory Visit: Payer: Self-pay | Admitting: Psychiatry

## 2023-08-15 DIAGNOSIS — F330 Major depressive disorder, recurrent, mild: Secondary | ICD-10-CM
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
## 2023-09-05 ENCOUNTER — Ambulatory Visit: Payer: Self-pay | Admitting: Psychiatry

## 2023-09-05 DIAGNOSIS — F330 Major depressive disorder, recurrent, mild: Secondary | ICD-10-CM
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
## 2023-09-17 ENCOUNTER — Ambulatory Visit: Payer: Self-pay | Admitting: Psychiatry

## 2023-09-17 DIAGNOSIS — F330 Major depressive disorder, recurrent, mild: Secondary | ICD-10-CM

## 2023-09-17 NOTE — Progress Notes (Signed)
 [facility] Counselor/Therapist Progress Note  Patient ID: [name] [name], MRN: [id number],    Date: 09/17/2023  Time Spent: 55 minutes   Treatment Type: Individual Therapy  Reported Symptoms: depression, anxiety   Mental Status Exam:  Appearance:   Casual     Behavior:  Appropriate, Sharing, and Motivated  Motor:  Normal  Speech/Language:   Clear and Coherent  Affect:  Depressed and anxiety  Mood:  anxious and depressed  Thought process:  goal directed  Thought content:    Rumination  Sensory/Perceptual disturbances:    WNL  Orientation:  oriented to person, place, time/date, situation, day of week, month of year, year, and stated date of Feb. 10, 2025  Attention:  Good  Concentration:  Good  Memory:  WNL  Fund of knowledge:   Good  Insight:    Good and Fair  Judgment:   Good and Fair  Impulse Control:  Good and Fair   Risk Assessment: Danger to Self:  No Self-injurious Behavior: No Danger to Others: No Duty to Warn:no Physical Aggression / Violence:No  Access to Firearms a concern: No  Gang Involvement:No   Subjective:  Patient today reporting anxiety and depression, and frustration particularly trying to help her elderly father with some symptoms that she feels may be "alzheimers" and wanting to "deal with my emotions" and get more help and testing for dad from neurologist.  " The day to day of being with my father and seeing him decline is overwhelming and smothering." Depression has strengthened. Does take her antidepressant med. Does seem some more focused today. Some tearfulness. More open today and seems more motivated but still struggles with her depression. Is sleeping with help of 1/2 sleeping pill.  Lists symptoms as depression, anxiety, tension, some sleep issues, anger, frustration, tearfulness several times daily. Has tried antidepressants but not feeling helped by them currently. Would like to see a psych med provider here at our office for updated eval  and determine what might help patient at this point.  Denies any SI. Does have a dog which she loves spending time with. Has 1 friend that she confides in plus feels free in talking with this therapist. More difficult to have opportunities to meet other people or make friends, some because of needing to stay with my dad as he can't be left unsupervised due to his memory issues increasing. States she feels better when she speaks with a friend, comes for therapy, or watching some of my favorite TV programs. States she would like to get scheduled with med provider and will be returning  for therapy within 2 weeks.  Interventions: Cognitive Behavioral Therapy and Ego-Supportive  1.Verbally identify the source of depressed mood, and begin talking further about how it became a source of depression and how she is affected by it currently. 2.Ask patient to make a list of what she is depressed about and process it in therapy, including how to confront it and manage it more effectively. 3.Identify cognitive self-talk that is engaged in to support depression and work to make her self-talk less depressing and more supportive and more free of depression.    Diagnosis:   ICD-10-CM   1. Major depressive disorder, recurrent episode, mild (HCC)  F33.0      Plan:  Patient today following up on her previous work regarding her anxiety and depression, "dealing with all my emotions", and frustration with the challenges her elderly father (who lives with patient) and who does seem  to be having some symptoms of "Alzheimer's".  Patient is working steadily on getting her father an appointment with an appropriate physician to help determine the proper diagnosis and health needs of patient's father.  Patient is feeling encouraged that at least "I have the name of a good doctor and and pursuing getting an appointment for my father."  Patient showing really good motivation today and more articulate and sharing more about herself  and her symptoms as well as how her emotional stability intersects with her elderly father's mood and behaviors in the home.  To follow-up on some journaling, per therapist request, and to bring in with her next session.  Goal review and progress/challenges noted with patient.  Next appointment within 2 weeks. Kelleen Patee, [name]

## 2023-10-02 ENCOUNTER — Ambulatory Visit (INDEPENDENT_AMBULATORY_CARE_PROVIDER_SITE_OTHER): Payer: Self-pay | Admitting: Psychiatry

## 2023-10-02 DIAGNOSIS — F411 Generalized anxiety disorder: Secondary | ICD-10-CM

## 2023-10-02 NOTE — Progress Notes (Signed)
 [facility] Counselor/Therapist Progress Note  Patient ID: [name] [name], MRN: [id number],    Date: 10/02/2023  Time Spent: 53 minutes   Treatment Type: Individual Therapy  Reported Symptoms: anxiety, depression , "but I am starting to feel more like me again"   Mental Status Exam:  Appearance:   Casual     Behavior:  Appropriate, Sharing, and Motivated  Motor:  Normal  Speech/Language:   Clear and Coherent  Affect:  Anxious, some depression  Mood:  anxious and depressed  Thought process:  goal directed  Thought content:    Rumination  Sensory/Perceptual disturbances:    WNL  Orientation:  oriented to person, place, time/date, situation, day of week, month of year, year, and stated date of Feb. 25, 2025  Attention:  Good  Concentration:  Good and Fair  Memory:  WNL  Fund of knowledge:   Good  Insight:    Good and Fair  Judgment:   Good  Impulse Control:  Good   Risk Assessment: Danger to Self:  No Self-injurious Behavior: No Danger to Others: No Duty to Warn:no Physical Aggression / Violence:No  Access to Firearms a concern: No  Gang Involvement:No   Subjective:   Patient in for session today and reports symptoms of anxiety, depression, frustration and trying to be of help to her elderly father who she reports is having some symptoms of Alzheimer's but not officially diagnosed yet.  Patient continues to say "I need to deal with my emotions" and is making efforts she also stated.  Needed session to really process a lot of of what is going on in the home between her elderly father and her as well as within the timeframe since her last appointment.  Some tearfulness and anger, lots of frustration but that has been relieved a lot by her father being admitted to a facility.  Depression has decreased. Feeling less stressed but know I have my feelings, anxiety, and some depression to work on. "Doctor already told me dad has alzheimer's. Takes her antidepressant but questioning  it and is making a change in her med provider with an appointment already made in April.  Ran out of time in her appointment today and will be seeing her again within 2 weeks.  Reports feeling more stable and is getting better sleep.  Enjoying time with her dog which is a real comfort for her.  Also has some friends that she confides and and are supportive.  Interventions: Cognitive Behavioral Therapy and Ego-Supportive  1.Verbally identify the source of depressed mood, and begin talking further about how it became a source of depression and how she is affected by it currently. 2.Ask patient to make a list of what she is depressed about and process it in therapy, including how to confront it and manage it more effectively. 3.Identify cognitive self-talk that is engaged in to support depression and work to make her self-talk less depressing and more supportive and more free of depression.   Diagnosis:   ICD-10-CM   1. GAD (generalized anxiety disorder)  F41.1      Plan:   Patient showing good motivation and work in session today as she continues to confront her anxiety and depression, "daily dealing with all of my feelings and emotions", confronting issues and challenges with her elderly father who lives with her and she reports is having some symptoms of "Alzheimer's".  Father had an episode about a week ago where he was very loud and difficult  to deal with and end result was he was admitted to the hospital.  (Not all details included in this note due to patient privacy needs.) patient reports she continues her work on her anxiety and depression and just dealing with all that has happened in the last week to week and a half.  Is starting to feel some encouragement now as she feels they are helping her find an appropriate placement for her father and he will not be coming back to her home.  Encouraged patient to continue some journaling and bring in with her next session.  Goal review and  progress/challenges noted with patient.  Next appointment within 2 weeks.   Mathis Fare, [name]

## 2023-10-16 ENCOUNTER — Ambulatory Visit: Payer: Self-pay | Admitting: Psychiatry

## 2023-10-25 ENCOUNTER — Ambulatory Visit: Payer: Self-pay | Admitting: Psychiatry

## 2023-10-25 DIAGNOSIS — F411 Generalized anxiety disorder: Secondary | ICD-10-CM

## 2023-10-25 NOTE — Progress Notes (Signed)
 [facility] Counselor/Therapist Progress Note  Patient ID: [name] [name], MRN: [id number],    Date: 10/25/2023  Time Spent: 50 minutes   Treatment Type: Individual Therapy  Reported Symptoms: anxiety, depression, stressed, tearfulness   Mental Status Exam:  Appearance:   Casual     Behavior:  Appropriate, Sharing, and Motivated  Motor:  Normal  Speech/Language:   Clear and Coherent  Affect:  Depressed, Tearful, and anxious  Mood:  anxious, depressed, and irritable  Thought process:  goal directed  Thought content:    Rumination  Sensory/Perceptual disturbances:    WNL  Orientation:  oriented to person, place, time/date, situation, day of week, month of year, year, and stated date of October 25, 2023  Attention:  Good  Concentration:  Good and Fair  Memory:  WNL  Fund of knowledge:   Good  Insight:    Good and Fair  Judgment:   Good and Fair  Impulse Control:  Good and Fair   Risk Assessment: Danger to Self:  No Self-injurious Behavior: No Danger to Others: No Duty to Warn:no Physical Aggression / Violence:No  Access to Firearms a concern: No  Gang Involvement:No   Subjective:  Patient in session today and reports continued symptoms of anxiety, stressed, tearfulness, depression, some anger and irritability, and trying to helpful to her elderly father she reports continues to have stronger symptoms of Alzheimers, and patient is checking for appropriate facility for her dad. Difficult initial journey for patient and she adds that she has found a place for dad and planning to put deposit down for him. " [facility]).Trying to deal better with her stress and we discussed several strategies for patient to do this as well as having more personal support from friends and people that care about her.  Distressing for patient to see her father struggling and also distressing knowing that she is needing to place him in a facility, although seem to be adjusting to this realization  more in session today and understanding that it is for his safety and wellbeing.  Continues to acknowledge that she needs to deal with her own emotions and worked hard on this today in session and also recognizes the relief that this can provide for her.  After processing a lot of her emotions today, patient stated that she does feel her depression is decreasing further.  Will be seeing her med provider Melony Overly here at our office soon and looks forward to checking in with her on some questions about her medication.  Sleep is some improved.  Her dog is a true comfort for her especially during this time of stress and trying to help her father as he transitions from hospital eventually to a facility to help support him with his Alzheimer's.  Is very thankful that she has a few friends that she feels are very supportive.  Interventions: Cognitive Behavioral Therapy and Ego-Supportive 1.Verbally identify the source of depressed mood, and begin talking further about how it became a source of depression and how she is affected by it currently. 2.Ask patient to make a list of what she is depressed about and process it in therapy, including how to confront it and manage it more effectively. 3.Identify cognitive self-talk that is engaged in to support depression and work to make her self-talk less depressing and more supportive and more free of depression.   Diagnosis:   ICD-10-CM   1. GAD (generalized anxiety disorder)  F41.1  Plan:  Patient today motivated and working well in session as she continues to focus on her anxiety, frustration, stress, irritability, anger, and depression, confronting issues and challenges related to her elderly father and his Alzheimer's.  Has a few friends that are very supportive of her and does feel that she is managing her stress a little better and making good decisions for her father and for herself.  Encouraged patient and her using journaling as a tool for expressing  some of her feelings and to bring into sessions with her.  Goal review and progress/challenges noted with patient.  Next appt within 2 to 3 weeks.   Mathis Fare, [name]

## 2023-10-30 ENCOUNTER — Ambulatory Visit: Payer: Self-pay | Admitting: Psychiatry

## 2023-11-07 ENCOUNTER — Ambulatory Visit: Payer: Self-pay | Admitting: Psychiatry

## 2023-11-07 DIAGNOSIS — F411 Generalized anxiety disorder: Secondary | ICD-10-CM

## 2023-11-07 NOTE — Progress Notes (Signed)
 [facility] Counselor/Therapist Progress Note  Patient ID: [name] [name], MRN: [id number],    Date: 11/07/2023  Time Spent: 48 minutes   Treatment Type: Individual Therapy  Reported Symptoms: anxiety, depression, stressed,   Mental Status Exam:  Appearance:   Casual     Behavior:  Appropriate, Sharing, and Motivated  Motor:  Normal  Speech/Language:   Clear and Coherent  Affect:  Depressed and anxious  Mood:  anxious and depressed  Thought process:  goal directed  Thought content:    Rumination  Sensory/Perceptual disturbances:    WNL  Orientation:  oriented to person, place, time/date, situation, day of week, month of year, year, and stated date of November 07, 2023  Attention:  Fair  Concentration:  Fair  Memory:  WNL  Fund of knowledge:   Good  Insight:    Good and Fair  Judgment:   Good and Fair  Impulse Control:  Good   Risk Assessment: Danger to Self:  No Self-injurious Behavior: No Danger to Others: No Duty to Warn:no Physical Aggression / Violence:No  Access to Firearms a concern: No  Gang Involvement:No   Subjective:    Patient today working in session on her symptoms of anxiety, depression, feeling stressed, irritability, anger, but also trying to be helpful to her elderly father who has Alzheimer's and recently was placed in a facility for memory care patients. Patient anxious and depressed today, with more tearfulness, a regarding her dad's illness and all the changes she has needed to make to help support her dad at this point in his life. Has been frustrated with a Jehovah's Witness group (to which her dad had belonged) who patient is finding that she is needing to set more clear boundaries with as she is not wanting to be involved with them.  Most of session today needed to help patient absorb and process a lot of what all has going on with her dad's health situation recently and how involved she has been in it, and how to take better care of herself  especially during this time of helping dad settle into his new place but also paying attention to her own needs.  Her tearfulness and willingness to let her guard down seemed very helpful for patient today and processing so much emotion and such significant changes with her fathers health over time.  She is accepting the current situation better with her father's health, where she is feeling significant grief at times, and also feeling grateful that she found a facility that was appropriate for his needs.  Acknowledges that she has needed and continues to need to "deal more with my own emotions and gain more peace".  Does feel that she is doing that at this point.  Continues to see med provider in our office, Melony Overly, PA-C.  Patient has a few close friends but is also significantly comforted by her dog and the special bond she feels she has with him.  Interventions: Cognitive Behavioral Therapy, Ego-Supportive, and Grief Therapy 1.Verbally identify the source of depressed mood, and begin talking further about how it became a source of depression and how she is affected by it currently. 2.Ask patient to make a list of what she is depressed about and process it in therapy, including how to confront it and manage it more effectively. 3.Identify cognitive self-talk that is engaged in to support depression and work to make her self-talk less depressing and more supportive and more free of depression.  Diagnosis:   ICD-10-CM   1. GAD (generalized anxiety disorder)  F41.1      Plan: Patient today showing good motivation as she continues working on her anxiety, stress, irritability, anger, frustrations, and depression.  She continues to work on challenges personally and also related to her elderly father and his Alzheimer's as they have been searching for an appropriate placement for him to get the supervision and care that he needs.  Patient has a few friends that she reports continue to be supportive  which is helpful.  Continues to manage stress as well as she can right now with what all she is going through, and does see bits of progress in spite of the difficulty.  Continue to encourage patient to use journaling as a tool for some of her thoughts and feelings and bring into sessions with her.  Motivated and working well on her treatment goals.  Goal review and progress/challenges noted with patient.  Next appointment within 2 to 3 weeks.   Mathis Fare, [name]

## 2023-11-12 ENCOUNTER — Encounter: Payer: Self-pay | Admitting: Physician Assistant

## 2023-11-12 ENCOUNTER — Ambulatory Visit: Payer: Self-pay | Admitting: Physician Assistant

## 2023-11-12 VITALS — BP 139/74 | HR 64 | Ht 61.0 in | Wt 162.4 lb

## 2023-11-12 DIAGNOSIS — Z6379 Other stressful life events affecting family and household: Secondary | ICD-10-CM

## 2023-11-12 DIAGNOSIS — F411 Generalized anxiety disorder: Secondary | ICD-10-CM

## 2023-11-12 DIAGNOSIS — F330 Major depressive disorder, recurrent, mild: Secondary | ICD-10-CM

## 2023-11-12 MED ORDER — VENLAFAXINE HCL ER 150 MG PO CP24: 150.0000 mg | ORAL_CAPSULE | Freq: Every day | ORAL | 0 refills | Status: DC

## 2023-11-12 MED ORDER — BUPROPION HCL ER (XL) 150 MG PO TB24: 150.0000 mg | ORAL_TABLET | Freq: Every morning | ORAL | 1 refills | Status: DC

## 2023-11-12 MED ORDER — ZOLPIDEM TARTRATE 5 MG PO TABS: 2.5000 mg | ORAL_TABLET | Freq: Every evening | ORAL | 1 refills | Status: DC | PRN

## 2023-11-12 NOTE — Progress Notes (Signed)
 [facility] MD/PA/NP Initial Note  11/12/2023 11:09 AM [name] [name]  MRN:  [id number]  Chief Complaint:  Chief Complaint   Establish Care    HPI:  Has had depression off and on 'for awhile.'  Her Dad is now in memory care for 2 weeks now and that's been difficult to deal with. She's understandably been very stressed lately. No PA, just overwhelmed sometimes.  Her energy and motivation are good.  ADLs and personal hygiene are normal.  Appetite is normal and weight is stable.  She does cry at times due to her circumstances but is able to console herself and move on to do what has to be done.  She has a dog that is a real comfort to her.  She has a few close friends she spends time with.  No feelings of hopelessness.  She sleeps well most of the time but does need Ambien.  Retired.  States that attention is good without easy distractibility.  Able to focus on things and finish tasks to completion.  No suicidal or homicidal thoughts.  Patient denies increased energy with decreased need for sleep, increased talkativeness, racing thoughts, impulsivity or risky behaviors, increased spending, increased libido, grandiosity, increased irritability or anger, paranoia, or hallucinations.  Visit Diagnosis:    ICD-10-CM   1. Stress due to illness of family member  Z63.79     2. Major depressive disorder, recurrent episode, mild (HCC)  F33.0 venlafaxine XR (EFFEXOR-XR) 150 MG 24 hr capsule    buPROPion (WELLBUTRIN XL) 150 MG 24 hr tablet    3. GAD (generalized anxiety disorder)  F41.1 venlafaxine XR (EFFEXOR-XR) 150 MG 24 hr capsule     Past Psychiatric History:   Past medications for mental health diagnoses include: Lamictal caused 'no emotions,' Effexor, Ambien, Wellbutrin.  Abilify, Xanax, hydroxyzine, Ativan  Past Medical History:  Past Medical History:  Diagnosis Date   Anxiety    Deafness    Wears Bilat hearing aids since Childhood   Environmental and seasonal allergies    Glaucoma, both  eyes    Hyperlipidemia    Major depressive disorder    Mild cognitive impairment, so stated 07/16/2013   Patient subjectively complains of word finding and memory deficits. MOCA 30-30 on 07-16-13 , paraphrasic errors- uses words such as  closet for garage    OA (osteoarthritis)    "all over"   Obstructive sleep apnea treated with BiPAP    Right arm weakness    upper due to shoulder issues   Wears glasses    Wears hearing aid in both ears     Past Surgical History:  Procedure Laterality Date   ABDOMINAL HERNIA REPAIR     ABDOMINAL HYSTERECTOMY     ANTERIOR CERVICAL DECOMP/DISCECTOMY FUSION N/A 09/30/2021   Procedure: Anterior Cervical Discectomy Fusion - Cervical Five - Cervical Six Cervical six - Cervical seven;  Surgeon: Isadora Mar, MD;  Location: [facility] OR;  Service: Neurosurgery;  Laterality: N/A;   BILATERAL CATARACT SURGERY      FRACTURE SURGERY Right    Foot   JOINT REPLACEMENT Bilateral    Knees   JOINT REPLACEMENT Bilateral 2020   Hips   KNEE ARTHROSCOPY W/ SYNOVECTOMY Left 07-20-2010  dr France Ina  [facility]   LAPAROSCOPIC VAGINAL HYSTERECTOMY WITH SALPINGO OOPHORECTOMY Bilateral 09-25-2005   dr Avis Boehringer  [facility]   REVERSE SHOULDER ARTHROPLASTY Left 05/20/2020   Procedure: REVERSE SHOULDER ARTHROPLASTY;  Surgeon: Oralia Bills, MD;  Location: [facility] OR;  Service: Orthopedics;  Laterality: Left;    REVERSE SHOULDER ARTHROPLASTY Right 05/12/2021   Procedure: REVERSE SHOULDER ARTHROPLASTY;  Surgeon: Ellard Gunning, MD;  Location: [facility] ORS;  Service: Orthopedics;  Laterality: Right;    RIGHT ANKLE TENDON REPAIR  12-21-2008   dr Mevelyn Acton  [facility]   TOTAL HIP ARTHROPLASTY Left 09/24/2017   Procedure: LEFT TOTAL HIP ARTHROPLASTY ANTERIOR APPROACH;  Surgeon: Liliane Rei, MD;  Location: [facility] ORS;  Service: Orthopedics;  Laterality: Left;   TOTAL HIP ARTHROPLASTY Right 12/19/2017   Procedure: RIGHT TOTAL HIP ARTHROPLASTY ANTERIOR APPROACH;  Surgeon: Liliane Rei, MD;  Location: [facility]  ORS;  Service: Orthopedics;  Laterality: Right;   TOTAL KNEE ARTHROPLASTY Left 11/21/2013   Procedure: LEFT TOTAL KNEE  ARTHROPLASTY REVISION;  Surgeon: Aurther Blue, MD;  Location: [facility] ORS;  Service: Orthopedics;  Laterality: Left;   TOTAL KNEE ARTHROPLASTY Bilateral 06-11-2006   dr France Ina  [facility]    Family Psychiatric History:  See below  Family History:  Family History  Problem Relation Age of Onset   Dementia Mother    Depression Mother    Alcohol abuse Mother    Dementia Father    Depression Father    Depression Brother    Alcohol abuse Brother    Depression Paternal Grandmother     Social History:  Social History   Socioeconomic History   Marital status: Divorced    Spouse name: Not on file   Number of children: 0   Years of education: Not on file   Highest education level: Some college, no degree  Occupational History    Employer: Investment banker, operational [employer]  Tobacco Use   Smoking status: Some Days    Types: Cigarettes   Smokeless tobacco: Never   Tobacco comments:    She quit 30 years ago, but then smoked a couple in the past 2 weeks. Updated 11/12/2023  Vaping Use   Vaping status: Never Used  Substance and Sexual Activity   Alcohol use: Not Currently    Alcohol/week: 0.0 - 1.0 standard drinks of alcohol   Drug use: Yes    Types: Marijuana    Comment: Smokes marijuana twice a week   Sexual activity: Yes    Partners: Female    Birth control/protection: None  Other Topics Concern   Not on file  Social History Narrative   Worked at [employer], Advertising account executive. Also [employer]. Other jobs. Lives alone with her dog, Zedi. Loves to read. Wants to get back to the gym. Likes to go to concerts. Has a few close friends she likes to spend time with.       Caffeine-4 cups of caffeine   Religion-"I don't believe in organized religion like most people. I also tend to have some wicken beliefs.' Was raised in the 'cult of Jehovah's witnesses.' Her dad was an elder.     Legal-none            Social Drivers of Health   Financial Resource Strain: Low Risk  (11/12/2023)   Overall Financial Resource Strain (CARDIA)    Difficulty of Paying Living Expenses: Not very hard  Food Insecurity: No Food Insecurity (11/12/2023)   Hunger Vital Sign    Worried About Running Out of Food in the Last Year: Never true    Ran Out of Food in the Last Year: Never true  Transportation Needs: No Transportation Needs (11/12/2023)   PRAPARE - Administrator, Civil Service (Medical): No    Lack of Transportation (  Non-Medical): No  Physical Activity: Inactive (11/12/2023)   Exercise Vital Sign    Days of Exercise per Week: 0 days    Minutes of Exercise per Session: 0 min  Stress: Stress Concern Present (11/12/2023)   [employer] of Occupational Health - Occupational Stress Questionnaire    Feeling of Stress : Very much  Social Connections: Socially Isolated (11/12/2023)   Social Connection and Isolation Panel [NHANES]    Frequency of Communication with Friends and Family: Three times a week    Frequency of Social Gatherings with Friends and Family: Once a week    Attends Religious Services: Never    Database administrator or Organizations: No    Attends Banker Meetings: Never    Marital Status: Divorced    Allergies:  Allergies  Allergen Reactions   Aleve [Naproxen]     bruising   Gramineae Pollens    Statins Other (See Comments)    Causing liver damage     Metabolic Disorder Labs: Lab Results  Component Value Date   HGBA1C 5.8 (H) 07/16/2013   No results found for: "PROLACTIN" No results found for: "CHOL", "TRIG", "HDL", "CHOLHDL", "VLDL", "LDLCALC" Lab Results  Component Value Date   TSH 4.160 07/16/2013    Therapeutic Level Labs: No results found for: "LITHIUM" No results found for: "VALPROATE" No results found for: "CBMZ"  Current Medications: Current Outpatient Medications  Medication Sig Dispense Refill   latanoprost  (XALATAN) 0.005 % ophthalmic solution Place 1 drop into both eyes at bedtime.     timolol (TIMOPTIC) 0.5 % ophthalmic solution Place 1 drop into both eyes in the morning.  3   buPROPion (WELLBUTRIN XL) 150 MG 24 hr tablet Take 1 tablet (150 mg total) by mouth every morning. 30 tablet 1   ezetimibe (ZETIA) 10 MG tablet Take 10 mg by mouth daily. (Patient not taking: Reported on 11/12/2023)     methocarbamol (ROBAXIN) 500 MG tablet Take 1 tablet (500 mg total) by mouth every 6 (six) hours as needed for muscle spasms. (Patient not taking: Reported on 11/12/2023) 60 tablet 0   Omega-3 Fatty Acids (FISH OIL PO) Take 1 capsule by mouth daily. (Patient not taking: Reported on 11/12/2023)     venlafaxine XR (EFFEXOR-XR) 150 MG 24 hr capsule Take 1 capsule (150 mg total) by mouth daily with breakfast. 90 capsule 0   zolpidem (AMBIEN) 5 MG tablet Take 0.5-1 tablets (2.5-5 mg total) by mouth at bedtime as needed for sleep. 30 tablet 1   No current facility-administered medications for this visit.    Medication Side Effects: none  Orders placed this visit:  No orders of the defined types were placed in this encounter.   Psychiatric Specialty Exam:  Review of Systems  Constitutional: Negative.   HENT: Negative.    Eyes: Negative.   Respiratory: Negative.    Cardiovascular: Negative.   Gastrointestinal: Negative.   Endocrine: Negative.   Genitourinary: Negative.   Musculoskeletal: Negative.   Skin: Negative.   Allergic/Immunologic: Negative.   Neurological: Negative.   Hematological: Negative.   Psychiatric/Behavioral:         See HPI    Blood pressure 139/74, pulse 64, height 5\' 1"  (1.549 m), weight 162 lb 6.4 oz (73.7 kg).Body mass index is 30.69 kg/m.  General Appearance: Casual and Well Groomed  Eye Contact:  Good  Speech:  Clear and Coherent and Normal Rate  Volume:  Normal  Mood:  Euthymic  Affect:  Congruent  Thought Process:  Goal Directed and Descriptions of Associations:  Circumstantial  Orientation:  Full (Time, Place, and Person)  Thought Content: Logical   Suicidal Thoughts:  No  Homicidal Thoughts:  No  Memory:  WNL  Judgement:  Good  Insight:  Good  Psychomotor Activity:  Normal  Concentration:  Concentration: Good  Recall:  Good  Fund of Knowledge: Good  Language: Good  Assets:  Desire for Improvement Housing Transportation  ADL's:  Intact  Cognition: WNL  Prognosis:  Good   Screenings:  PHQ2-9    Flowsheet Row Office Visit from 11/12/2023 in [facility]  PHQ-2 Total Score 6  PHQ-9 Total Score 15      Flowsheet Row ED from 02/22/2022 in [facility] at [facility] Admission (Discharged) from 09/30/2021 in [facility]  [facility] Pre-Admission Testing 60 from 09/27/2021 in [facility] PREADMISSION TESTING  C-SSRS RISK CATEGORY No Risk No Risk No Risk      Receiving Psychotherapy: Yes with Reid Capuchin, [name]  Treatment Plan/Recommendations:   PDMP reviewed.  Ambien filled 10/19/2023. I provided 65 minutes of face to face time during this encounter, including time spent before and after the visit in records review, medical decision making, counseling pertinent to today's visit, and charting.   Recommend decreasing caffeine during the day which can cause increased anxiety.  Sleep hygiene discussed.  For now no changes in medications will be made.  Continue Wellbutrin XL 150 mg, 1 p.o. every morning. Continue Effexor XR 150 mg every morning. Continue Ambien 5 mg, 1/2-1 p.o. nightly as needed sleep. Continue therapy with Reid Capuchin, [name]. Return in 6 to 8 weeks.  Marvia Slocumb, PA-C

## 2023-11-26 ENCOUNTER — Ambulatory Visit (INDEPENDENT_AMBULATORY_CARE_PROVIDER_SITE_OTHER): Payer: Self-pay | Admitting: Psychiatry

## 2023-11-26 DIAGNOSIS — F411 Generalized anxiety disorder: Secondary | ICD-10-CM

## 2023-11-26 NOTE — Progress Notes (Signed)
 [facility] Counselor/Therapist Progress Note  Patient ID: [name] [name], MRN: [id number],    Date: 11/26/2023  Time Spent: 53 minutes   Treatment Type: Individual Therapy  Reported Symptoms: anxiety, depression, stressed, "gloom and distress"  Mental Status Exam:  Appearance:   Casual     Behavior:  Appropriate, Sharing, and Motivated  Motor:  Normal  Speech/Language:   Clear and Coherent  Affect:  Depressed and anxious  Mood:  anxious and depressed  Thought process:  goal directed  Thought content:    Rumination  Sensory/Perceptual disturbances:    WNL  Orientation:  oriented to person, place, time/date, situation, day of week, month of year, year, and stated date of November 26, 2022  Attention:  Good  Concentration:  Fair  Memory:  WNL  Fund of knowledge:   Good  Insight:    Fair  Judgment:   Good and Fair  Impulse Control:  Good   Risk Assessment: Danger to Self:  No Self-injurious Behavior: No Danger to Others: No Duty to Warn:no Physical Aggression / Violence:No  Access to Firearms a concern: No  Gang Involvement:No   Subjective: Patient today in session frustrated, angry, anxious, and depressed all relating to getting Dad into long term care facility. Lots of sadness to share and work through today, as Dad is no longer recognizing patient. Hard to visit Dad and hard to not visit him. "A hard transition" and proceeded to share how the past few weeks have been for her emotionally. States it has been helpful for her to share a lot of her thoughts about her dad and herself. Shares "It's only been 3 yrs since mom died." Needed session today to work on her sadness, anxiety, and depression about her dad's memory decline and her getting him into a residential facility for "memory loss patients".  Showing good effort and motivation in session today on issues that are very sensitive and very difficult for this patient understandably.  Less irritability, less anger.  Hard that  her dad is no longer recognizing her or at least has not recently.  Tearful but has decreased some.  To friends especially have been more supportive recently.  Has been very involved in getting her dad settled into an appropriate placement and finds it hard at times of letting go and letting others take care of him at this point.  Processing her emotions more specifically today which seemed helpful for her and she does feel grateful that she has located a facility for him that seems to meet his needs.  Trying to currently process and manage her own emotions as she wants to gain "eventual peace" in all that has happened more recently in the hospitalization and then placement of her father into a long-term care facility.  Continues to remain on her medications and is in touch with a few close friends, including the love of her dog who is very special and "healing" for patient.  Interventions: Cognitive Behavioral Therapy and Ego-Supportive  1.Verbally identify the source of depressed mood, and begin talking further about how it became a source of depression and how she is affected by it currently. 2.Ask patient to make a list of what she is depressed about and process it in therapy, including how to confront it and manage it more effectively. 3.Identify cognitive self-talk that is engaged in to support depression and work to make her self-talk less depressing and more supportive and more free of depression.   Diagnosis:  ICD-10-CM   1. GAD (generalized anxiety disorder)  F41.1      Plan:  Patient today working further on her anxiety, stress, frustrations, sadness, and depression, particularly around the recent hospitalization and long-term Alzheimer's placement of her dad into an appropriate facility for his needs.  She reports that she does feel positive about the facility and has the ability to see him often but for now is taking a break as she has felt these past few days she was better not visiting  there, however does feel positive about the facility.  Appreciates the contact and comfort of a few friends with which she is close.  Feels that she is managing this as well as she can and does see small bits of progress along the way.  Therapist continues to encourage patient in her healing and in the use of journaling as a tool she has used previously to express thoughts and feelings and brought them into sessions for discussion.  Patient remains motivated and can see some of her progress.  Goal review and progress/challenges noted with patient.  Next appt within 1-2 weeks.   Kelleen Patee, [name]

## 2023-12-03 ENCOUNTER — Ambulatory Visit (INDEPENDENT_AMBULATORY_CARE_PROVIDER_SITE_OTHER): Payer: Self-pay | Admitting: Psychiatry

## 2023-12-03 DIAGNOSIS — F411 Generalized anxiety disorder: Secondary | ICD-10-CM

## 2023-12-03 NOTE — Progress Notes (Signed)
 [facility] Counselor/Therapist Progress Note  Patient ID: [name] [name], MRN: [id number],    Date: 12/03/2023  Time Spent: 52 minutes   Treatment Type: Individual Therapy  Reported Symptoms: anxiety, depression, stressed, and some positive shift in her "thinking and perspective" as discussed some last session     Mental Status Exam:  Appearance:   Casual     Behavior:  Appropriate, Sharing, and Motivated  Motor:  Normal  Speech/Language:   Clear and Coherent  Affect:  Depressed and anxiety  Mood:  anxious and depressed  Thought process:  goal directed  Thought content:    Some ruminating but improving  Sensory/Perceptual disturbances:    WNL  Orientation:  oriented to person, place, time/date, situation, day of week, month of year, year, and stated date of December 03, 2023  Attention:  Good  Concentration:  Good and Fair  Memory:  WNL  Fund of knowledge:   Good  Insight:    Good and Fair  Judgment:   Good  Impulse Control:  Good and Fair   Risk Assessment: Danger to Self:  No Self-injurious Behavior: No Danger to Others: No Duty to Warn:no Physical Aggression / Violence:No  Access to Firearms a concern: No  Gang Involvement:No   Subjective:   Patient actively participating in session today working further on her anxiety, anger, frustration, and depression mostly related to having to "recently getting her dad into a long-term care facility and all of the stress that goes along with that." Situation continues with her father in long term care placement. Continues work today on her acceptance of "decline of father who is not recognizing me." Talked through this more today as patient works further through some of her grief regarding the emotional/mental decline in her father, and also looking at "how to best take care of myself , help take care of dad, and have relationships again." Dad remains in long term care facility. Patient admits to recently over-drinking of alcohol.  States she has begun limiting herself to "2 beers in a day and has been doing this past 2-3 weeks." Shared mom was alcoholic. Working to decrease her "emotional outbursts" and has made progress. Still having sadness, depression, anxiety re: her Dad and his decline. Also "doing some things just for me, that I enjoy." Working on gradual acceptance of all that has happened recently and "I realize I need to learn from my mistakes, be good to myself, and be able to accept and move forward eventually." Definitely less angry and some irritability and understanding. Sad that dad is not recognizing her. Continued working on "letting go more" and allowing others to do this also.  Patient did quite well today and sharing and talking through her emotions as she is more comfortable letting them out and working through her sadness but also some acceptance in her dad's situation of needing to be placed in long-term memory care unit of local facility.  Patient shares and processes her emotions openly and seems "to feel more positive today that she will eventually feel more peace with the decision she has had to make on behalf of her father and that he needed to be placed for his own safety."  States that she is remaining on her medication as prescribed and staying in contact with close friends as well as spending quality time with her dog which she has acknowledged is an integral part of her healing.  Interventions: Cognitive Behavioral Therapy and Solution-Oriented/Positive Psychology  1.Verbally  identify the source of depressed mood, and begin talking further about how it became a source of depression and how she is affected by it currently. 2.Ask patient to make a list of what she is depressed about and process it in therapy, including how to confront it and manage it more effectively. 3.Identify cognitive self-talk that is engaged in to support depression and work to make her self-talk less depressing and more supportive  and more free of depression.    Diagnosis:   ICD-10-CM   1. GAD (generalized anxiety disorder)  F41.1      Plan:  Patient focused well today on her stress, anxiety, frustrations, sadness, and depression all related to the recent hospitalization of her father who is suffering with "long-term Alzheimer's" and recently was placed into an appropriate facility for his needs now and into the future.  Having a lot of sadness today but also showing more encouraged and determination, including more acceptance of the situation with her father and herself and how she has made courageous decisions for his wellbeing.  She is feeling positive about the environment of the facility where he is being cared for.  Encouraged patient to continue using journaling as a tool to help her share and process thoughts and feelings as they arise and bring to session with her.  States that she is aware of her own progress most recently, which is definitely a step forward for her.  Good motivation.  Goal review and progress/challenges noted with patient.  Next appointment within 1 to 2 weeks.   [name] Patee, [name]

## 2023-12-18 ENCOUNTER — Ambulatory Visit (INDEPENDENT_AMBULATORY_CARE_PROVIDER_SITE_OTHER): Payer: Self-pay | Admitting: Psychiatry

## 2023-12-18 DIAGNOSIS — F411 Generalized anxiety disorder: Secondary | ICD-10-CM

## 2023-12-18 NOTE — Progress Notes (Signed)
 [facility] Counselor/Therapist Progress Note  Patient ID: [name] [name], MRN: [id number],    Date: 12/18/2023  Time Spent: 53 minutes   Treatment Type: Individual Therapy  Reported Symptoms: anxiety, depression, stressed, more positive shift in her "thinking and perspective", some sadness and grief    Mental Status Exam:  Appearance:   Casual and Neat     Behavior:  Appropriate, Sharing, and Motivated  Motor:  Normal  Speech/Language:   Clear and Coherent  Affect:  anxious  Mood:  anxious and depressed  Thought process:  goal directed  Thought content:    Rumination  Sensory/Perceptual disturbances:    WNL  Orientation:  oriented to person, place, time/date, situation, day of week, month of year, year, and stated date of Dec 18, 2023  Attention:  Good  Concentration:  Fair  Memory:  Some challenges currently in being "over-whelmed"   Fund of knowledge:   Good and Fair  Insight:    Good and Fair  Judgment:   Good  Impulse Control:  Good   Risk Assessment: Danger to Self:  No Self-injurious Behavior: No Danger to Others: No Duty to Warn:no Physical Aggression / Violence:No  Access to Firearms a concern: No  Gang Involvement:No   Subjective:  Patient in session today actively participating as she continues her work on her frustrations, anxiety, anger, and depression mostly related to the recent admission of her dad into a long-term care facility, and all of the "feelings and stress that goes along with that type decision". Needed session today to talk through her father's quick decline physically, mentally, and emotionally.  Lots of sadness and second-guessing of herself at times. Tearful at times as she talked very openly about her sadness and dealing "with so many changes". Sadness re: her dad's current health status and her overseeing his affairs as he is placed in a facility due to his Alzheimers. Today working through grief, frustration, anger, and lots of stress  which she talked through at length. Good support from several friends including one who may be moving here soon. Some visiting with dad at his facility, which is good to see him but difficult to see his decline, which patient processed further today. Working on accepting dad's decline and also feeling free to set what ever limits feels the healthiest for her in terms of visiting him.  States that dad does not recognize her at all at this point, and helping her work through her grief and all of this is an active part of her therapy.  Patient has had several contacts recently with a couple of good friends over the years which has been helpful.  No further over drinking of alcohol since we discussed at last session.  Trying to work on achieving a sense of gradual acceptance of all that has happened recently in relation to her dad and herself and wanting to be able to move forward eventually in a healthier and more positive direction.  Does feel less anger and irritability.  Finds herself working on "letting go" and getting more comfortable in sharing and talking through her emotions as she reports being more comfortable with them at this point and states that it helps her and working through her sadness.  Has become far more accepting of the fact that her dad needed to be placed in a facility for his own safety as his Alzheimer's have progressed more recently.  Encouraged patient today to continue her increased contact with close friends,  spending quality time with her dog who is definitely a part of her healing, and staying on her medication as prescribed.  Noticeable progress since last session.   Interventions: Cognitive Behavioral Therapy and Ego-Supportive 1.Verbally identify the source of depressed mood, and begin talking further about how it became a source of depression and how she is affected by it currently. 2.Ask patient to make a list of what she is depressed about and process it in therapy, including  how to confront it and manage it more effectively. 3.Identify cognitive self-talk that is engaged in to support depression and work to make her self-talk less depressing and more supportive and more free of depression.    Diagnosis:   ICD-10-CM   1. GAD (generalized anxiety disorder)  F41.1      Plan:  Patient today in session working on her anxiety, sadness, frustrations, stress, and grief related to her dad's decline with his Alzheimer's and needing to go into a facility for appropriate care for current and future needs.  Patient's sadness is not as acute today and she did well and talking through sadness as well as the awareness of things that are feeding her grief and sadness as well as some things that are beginning to feed some hope for her.  Continues to show courage, some increased acceptance of what she can change and cannot change, and feels positive about the care he is getting where he is a patient.  Reports that journaling has been helpful to her and plans to keep using that resource as a tool going forward.  Feeling good about the progress she has made, is motivated and hoping to stay on track and continue her progress as she feels these are positive steps going forward. Arina Checchi is making progress and needs to continue working with goal-directed behaviors in order to keep moving in a more positive and hopeful direction.  Goal review and progress/challenges noted with patient.  Next appointment within 2 weeks.   Kelleen Patee, [name]

## 2024-01-01 ENCOUNTER — Ambulatory Visit (INDEPENDENT_AMBULATORY_CARE_PROVIDER_SITE_OTHER): Payer: Self-pay | Admitting: Psychiatry

## 2024-01-01 DIAGNOSIS — F330 Major depressive disorder, recurrent, mild: Secondary | ICD-10-CM

## 2024-01-01 NOTE — Progress Notes (Signed)
 [facility] Counselor/Therapist Progress Note  Patient ID: [name] [name], MRN: [id number],    Date: 01/01/2024  Time Spent: 53 minutes   Treatment Type: Individual Therapy  Reported Symptoms:  depression, anxiety decreasing, some sadness and grief re: dad in Alzheimer's unit    Mental Status Exam:  Appearance:   Casual     Behavior:  Appropriate, Sharing, and Motivated  Motor:  Normal  Speech/Language:   Clear and Coherent  Affect:  Depressed  Mood:  anxious and depressed  Thought process:  goal directed  Thought content:    WNL  Sensory/Perceptual disturbances:    WNL  Orientation:  oriented to person and time/date, place, situation, fully oriented  Attention:  Good  Concentration:  Good  Memory:  WNL  Fund of knowledge:   Good  Insight:    Good and Fair  Judgment:   Good  Impulse Control:  Good   Risk Assessment: Danger to Self:  No Self-injurious Behavior: No Danger to Others: No Duty to Warn:no Physical Aggression / Violence:No  Access to Firearms a concern: No  Gang Involvement:No   Subjective: Patient in session today very motivated and working further on better managing her fathers physical decline with his Alzheimer's, his loss of memory and not recognizing her, and a sense of sadness and second-guessing herself at times about certain decisions that we processed in session today.  (Not all details included in this note due to patient privacy needs).  Some sadness continuing regarding her dad's health status and overseeing his affairs however also feels more progress in her being able to emotionally manage certain behaviors and situations more effectively now than she could in recent weeks.  Is very sad regarding dad's current status but also very grateful that she has been able to get him into a facility that works well with Alzheimer's patients and so far has been a good fit for her father.  Talks through more of her grief and frustration today as well as some  stress but noticing that her anger has decreased.  Anxiety also decreasing.  Several friends continue to be very supportive including 1 really good friend over the years who had moved to [place]  and came back to see patient most recently.  Some tearfulness as patient talked about "losses and changes and losses of some things that she cannot recover with my dad".  Wanting to be able to move forward and is definitely motivated and working on managing her sadness and grief but also her gratitude for being able to get dad placed appropriately and also for all he has met to her over the years.  Working on "letting go" a little easier and has experienced some of this most recently this past week.  Knows that dad is in a safe place and that is important for her.  Is trying to increase her contact with her own friends spending more time with her dog who is an active part of her healing, remaining on her medications as prescribed, and getting out of the house more.  Definitely feels that she is progressing.  Interventions: Cognitive Behavioral Therapy, Ego-Supportive, and Grief Therapy 1.Verbally identify the source of depressed mood, and begin talking further about how it became a source of depression and how she is affected by it currently. 2.Ask patient to make a list of what she is depressed about and process it in therapy, including how to confront it and manage it more effectively. 3.Identify cognitive self-talk  that is engaged in to support depression and work to make her self-talk less depressing and more supportive and more free of depression.     Diagnosis:   ICD-10-CM   1. Major depressive disorder, recurrent episode, mild (HCC)  F33.0      Plan:   Patient working well in session today focusing further on her sadness, stress, frustrations, anxiety, and grief related to her dad's decline with his Alzheimer's and her having to get him placed into a facility appropriate to care for his needs now and  into the future. Expressed some of her fears and anxieties about continuing issues with her father and some outside influence of a religious group that has not been helpful for him. Processed how she plans to better manage this situation and let people at the facility know a limit needs to be set on certain people from the outside that dad does not recognize anymore and would not want interaction.  (Not all details included in this note due to patient privacy needs).  Inc. continue to encourage patient in her journaling as this has helped her previously be able to "stay on track and keep progressing" and she also feels it is a positive way of recognizing challenges and also gains in this process.  [name] [name] continues to make progress and needs to keep working with her goal-directed behaviors which are helping her to move forward in a more positive and hopeful direction.  Review and progress/challenges noted with patient.  Next appointment within 2 weeks.   Kelleen Patee, [name]

## 2024-01-10 ENCOUNTER — Ambulatory Visit: Payer: Self-pay | Admitting: Physician Assistant

## 2024-01-14 ENCOUNTER — Ambulatory Visit: Payer: Self-pay | Admitting: Psychiatry

## 2024-01-15 ENCOUNTER — Ambulatory Visit: Payer: Self-pay | Admitting: Psychiatry

## 2024-01-29 ENCOUNTER — Ambulatory Visit: Payer: Self-pay | Admitting: Psychiatry

## 2024-02-04 ENCOUNTER — Other Ambulatory Visit: Payer: Self-pay

## 2024-02-04 ENCOUNTER — Telehealth: Payer: Self-pay | Admitting: Physician Assistant

## 2024-02-04 DIAGNOSIS — F330 Major depressive disorder, recurrent, mild: Secondary | ICD-10-CM

## 2024-02-04 DIAGNOSIS — F411 Generalized anxiety disorder: Secondary | ICD-10-CM

## 2024-02-04 MED ORDER — VENLAFAXINE HCL ER 150 MG PO CP24: 150.0000 mg | ORAL_CAPSULE | Freq: Every day | ORAL | 0 refills | Status: DC

## 2024-02-04 MED ORDER — BUPROPION HCL ER (XL) 150 MG PO TB24: 150.0000 mg | ORAL_TABLET | Freq: Every morning | ORAL | 0 refills | Status: DC

## 2024-02-04 NOTE — Telephone Encounter (Signed)
 Pt called and said that she wants this medicine to go to [facility] not [facility]

## 2024-02-04 NOTE — Telephone Encounter (Signed)
 Scripts were sent to [facility] at her last appt. LF Zolpidem  6/11, due 7/9. Will send other meds to [place].

## 2024-02-05 ENCOUNTER — Ambulatory Visit: Payer: Self-pay | Admitting: Psychiatry

## 2024-02-05 NOTE — Progress Notes (Unsigned)
 [facility] Counselor/Therapist Progress Note  Patient ID: [name] [name], MRN: [id number],    Date: 02/05/2024  Time Spent: ***   Treatment Type: {CHL AMB THERAPY TYPES:[phone]}  Reported Symptoms: ***   depression, anxiety decreasing, some sadness and grief re: dad in Alzheimer's unit      Mental Status Exam:  Appearance:   {PSY:22683}     Behavior:  {PSY:21022743}  Motor:  {PSY:22302}  Speech/Language:   {PSY:22685}  Affect:  {PSY:22687}  Mood:  {PSY:31886}  Thought process:  {PSY:31888}  Thought content:    {PSY:[phone]}  Sensory/Perceptual disturbances:    {PSY:[id number]}  Orientation:  {PSY:30297}  Attention:  {PSY:22877}  Concentration:  {PSY:905-225-9178}  Memory:  {[phone]}  Fund of knowledge:   {PSY:905-225-9178}  Insight:    {PSY:905-225-9178}  Judgment:   {PSY:905-225-9178}  Impulse Control:  {PSY:905-225-9178}   Risk Assessment: Danger to Self:  {PSY:22692} Self-injurious Behavior: {PSY:22692} Danger to Others: {PSY:22692} Duty to Warn:{PSY:311194} Physical Aggression / Violence:{PSY:21197} Access to Firearms a concern: {PSY:21197} Gang Involvement:{PSY:21197}  Subjective: ***Patient today in session reporting symptoms of anxiety, stress, frustrations, sadness, and grief related to her father's decline with his Alzheimer's and is now placed in a facility to meet his needs.    Patient in session today very motivated and working further on better managing her fathers physical decline with his Alzheimer's, his loss of memory and not recognizing her, and a sense of sadness and second-guessing herself at times about certain decisions that we processed in session today. (Not all details included in this note due to patient privacy needs). Some sadness continuing regarding her dad's health status and overseeing his affairs however also feels more progress in her being able to emotionally manage certain behaviors and situations more effectively now than  she could in recent weeks. Is very sad regarding dad's current status but also very grateful that she has been able to get him into a facility that works well with Alzheimer's patients and so far has been a good fit for her father. Talks through more of her grief and frustration today as well as some stress but noticing that her anger has decreased. Anxiety also decreasing. Several friends continue to be very supportive including 1 really good friend over the years who had moved to [place]  and came back to see patient most recently. Some tearfulness as patient talked about losses and changes and losses of some things that she cannot recover with my dad. Wanting to be able to move forward and is definitely motivated and working on managing her sadness and grief but also her gratitude for being able to get dad placed appropriately and also for all he has met to her over the years. Working on letting go a little easier and has experienced some of this most recently this past week. Knows that dad is in a safe place and that is important for her. Is trying to increase her contact with her own friends spending more time with her dog who is an active part of her healing, remaining on her medications as prescribed, and getting out of the house more. Definitely feels that she is progressing.    Interventions: {[phone]} 1.Verbally identify the source of depressed mood, and begin talking further about how it became a source of depression and how she is affected by it currently. 2.Ask patient to make a list of what she is depressed about and process it in therapy, including how to confront it and manage it more effectively.  3.Identify cognitive self-talk that is engaged in to support depression and work to make her self-talk less depressing and more supportive and more free of depression.     Diagnosis:No diagnosis found.     Plan: ***   Patient today in session working further on her anxiety,  sadness, stress, frustrations, and grief related to her father's decline with his Alzheimer's and patient having to get him placed into a facility appropriate for his care now and into the future.  This process has included a lot of tough decision making and patient has shown a lot of strength in addition to her feelings of increased anxiety and overwhelmingness at times.     Expressed some of her fears and anxieties about continuing issues with her father and some outside influence of a religious group that has not been helpful for him. Processed how she plans to better manage this situation and let people at the facility know a limit needs to be set on certain people from the outside that dad does not recognize anymore and would not want interaction.  (Not all details included in this note due to patient privacy needs).  Inc. continue to encourage patient in her journaling as this has helped her previously be able to stay on track and keep progressing and she also feels it is a positive way of recognizing challenges and also gains in this process.  Leanah Kolander continues to make progress and needs to keep working with her goal-directed behaviors which are helping her to move forward in a more positive and hopeful direction.     /////////////////////////////////////////////////////////////////////////////////////////////////////   Goal review and progress/challenges noted with patient.  Next appointment within 3 weeks.   Barnie Bunde, [name]

## 2024-02-13 ENCOUNTER — Other Ambulatory Visit: Payer: Self-pay

## 2024-02-13 NOTE — Telephone Encounter (Signed)
 Zolpidem  due 7/10, not 7/9

## 2024-02-14 ENCOUNTER — Other Ambulatory Visit: Payer: Self-pay

## 2024-02-14 MED ORDER — ZOLPIDEM TARTRATE 5 MG PO TABS: 2.5000 mg | ORAL_TABLET | Freq: Every evening | ORAL | 0 refills | Status: DC | PRN

## 2024-02-14 NOTE — Telephone Encounter (Signed)
 Pended.

## 2024-02-18 ENCOUNTER — Ambulatory Visit (INDEPENDENT_AMBULATORY_CARE_PROVIDER_SITE_OTHER): Payer: Self-pay | Admitting: Psychiatry

## 2024-02-18 DIAGNOSIS — F330 Major depressive disorder, recurrent, mild: Secondary | ICD-10-CM

## 2024-02-18 NOTE — Progress Notes (Signed)
 [facility] Counselor/Therapist Progress Note  Patient ID: [name] [name], MRN: [id number],    Date: 02/18/2024  Time Spent: 55 minutes   Treatment Type: Individual Therapy  Reported Symptoms: depression, anxiety, some sadness and grief re: dad in Alzheimer's unit    Mental Status Exam:  Appearance:   Casual and Neat     Behavior:  Appropriate, Sharing, and Motivated  Motor:  Normal  Speech/Language:   Clear and Coherent  Affect:  Depressed and anxious  Mood:  anxious and depressed  Thought process:  goal directed  Thought content:    WNL  Sensory/Perceptual disturbances:    WNL  Orientation:  oriented to person, place, time/date, situation, day of week, month of year, year, and stated date of February 18, 2024  Attention:  Good  Concentration:  Good  Memory:  Some   Fund of knowledge:   Good  Insight:    Good and Fair  Judgment:   Good  Impulse Control:  Fair   Risk Assessment: Danger to Self:  No Self-injurious Behavior: No Danger to Others: No Duty to Warn:no Physical Aggression / Violence:No  Access to Firearms a concern: No  Gang Involvement:No   Subjective:   Patient today in session reporting anxiety, depressed a lot, and crying past couple of weeks. Has been under a lot of stress and had to have unexpected surgery on her hand, and still wearing brace as it heals. Met 2 new friends and enjoying time with them. Still working of adjusting after having to place her dad in memory care unit. Also frustrated with some business issues and challenges with people who it appeared to patient that they were not acting in her best interest. Patient needing time in session today to share and further process her feelings related to Dad's mental status which is hard for patient and was able to share her sadness, stressors, and also so things that had happened in the midst of all the stressors with her dad and was able to laugh about some of how she has managed. Don't feel so bad  about having to place dad in an alzheimer's unit. States that she was so angry for a while and worked/working through a lot of that anger and starting to move forward more and starting to feel more free. Letting go more. More social contacts. Feeling more of her progress.   Interventions: Cognitive Behavioral Therapy, Solution-Oriented/Positive Psychology, and Ego-Supportive 1.Verbally identify the source of depressed mood, and begin talking further about how it became a source of depression and how she is affected by it currently. 2.Ask patient to make a list of what she is depressed about and process it in therapy, including how to confront it and manage it more effectively. 3.Identify cognitive self-talk that is engaged in to support depression and work to make her self-talk less depressing and more supportive and more free of depression.    Diagnosis:   ICD-10-CM   1. Major depressive disorder, recurrent episode, mild (HCC)  F33.0      Plan:   Patient in session today and focusing more on her stress, frustrations, anxiety, sadness, and grief related to her father's continued decline with his Alzheimer's and getting him situated into an appropriate facility for care.  She does feel very good about the decision she is helping make for her dad but is very sad in the process.  Vented about her sadness, some frustrations, but also some hopefulness for eventually moving forward and  being able to cope better.  She is showing more strength and is starting to recognize some of that herself.  Continues to work on her treatment goals and making better decisions.  [name] [name] is making progress and realizes that she needs to keep working with her goal-directed behaviors in order to keep moving forward in a positive or more hopeful direction.  Goal review and progress/challenges noted with patient.  Next appointment within 3 weeks.   Barnie Bunde, [name]

## 2024-02-20 ENCOUNTER — Ambulatory Visit (INDEPENDENT_AMBULATORY_CARE_PROVIDER_SITE_OTHER): Payer: Self-pay | Admitting: Physician Assistant

## 2024-02-20 ENCOUNTER — Encounter: Payer: Self-pay | Admitting: Physician Assistant

## 2024-02-20 DIAGNOSIS — F330 Major depressive disorder, recurrent, mild: Secondary | ICD-10-CM

## 2024-02-20 DIAGNOSIS — F411 Generalized anxiety disorder: Secondary | ICD-10-CM

## 2024-02-20 DIAGNOSIS — R413 Other amnesia: Secondary | ICD-10-CM

## 2024-02-20 MED ORDER — VENLAFAXINE HCL ER 150 MG PO CP24: 150.0000 mg | ORAL_CAPSULE | Freq: Every day | ORAL | 1 refills | Status: DC

## 2024-02-20 MED ORDER — BUPROPION HCL ER (XL) 150 MG PO TB24: 150.0000 mg | ORAL_TABLET | Freq: Every morning | ORAL | 1 refills | Status: DC

## 2024-02-20 MED ORDER — ZOLPIDEM TARTRATE 5 MG PO TABS: 2.5000 mg | ORAL_TABLET | Freq: Every evening | ORAL | 5 refills | Status: DC | PRN

## 2024-02-20 NOTE — Progress Notes (Signed)
 [facility] Med Check  Patient ID: ALEIYA RYE,  MRN: [id number]  PCP: Pura Lenis, MD  Date of Evaluation: 02/25/2024 Time spent:20 minutes  Chief Complaint:  Chief Complaint   Depression; Insomnia; Follow-up    HISTORY/CURRENT STATUS: HPI For routine med check.   She's doing well. Feels like current meds are effective.  Patient is able to enjoy things.  Likes to read.  Energy and motivation are good.  No extreme sadness, tearfulness, or feelings of hopelessness.  Sleeps well with the Ambien . ADLs and personal hygiene are normal.    Appetite has not changed.  Weight is stable.  Denies feeling nervous and/or restless. No sense of impending doom. Is able to control worry.  Denies tendency to avoid of things that may trigger anxiety. No tachycardia, palpitations, SOB, sweating, or trembling.  No mania, delirium, AH/VH.  No SI/HI.  She is very concerned about her memory.  Has a strong family history of dementia, both her mom and dad had it.  Is more forgetful but is able to function, does not get lost when driving.  She would like to be checked to see if there is any concern so it can be addressed early on if that is a problem.   Denies dizziness, syncope, seizures, numbness, tingling, tremor, tics, unsteady gait, slurred speech, confusion. Denies muscle or joint pain, stiffness, or dystonia.  Individual Medical History/ Review of Systems: Changes? :Yes   Had left thumb surgery since LOV  Past medications for mental health diagnoses include: Lamictal  caused 'no emotions,' Effexor , Ambien , Wellbutrin .  Abilify , Xanax , hydroxyzine , Ativan   Allergies: Aleve [naproxen], Gramineae pollens, and Statins  Current Medications:  Current Outpatient Medications:    latanoprost  (XALATAN ) 0.005 % ophthalmic solution, Place 1 drop into both eyes at bedtime., Disp: , Rfl:    timolol  (TIMOPTIC ) 0.5 % ophthalmic solution, Place 1 drop into both eyes in the morning., Disp: , Rfl: 3   buPROPion   (WELLBUTRIN  XL) 150 MG 24 hr tablet, Take 1 tablet (150 mg total) by mouth every morning., Disp: 90 tablet, Rfl: 1   ezetimibe  (ZETIA ) 10 MG tablet, Take 10 mg by mouth daily. (Patient not taking: Reported on 11/12/2023), Disp: , Rfl:    methocarbamol  (ROBAXIN ) 500 MG tablet, Take 1 tablet (500 mg total) by mouth every 6 (six) hours as needed for muscle spasms. (Patient not taking: Reported on 11/12/2023), Disp: 60 tablet, Rfl: 0   Omega-3 Fatty Acids (FISH OIL PO), Take 1 capsule by mouth daily. (Patient not taking: Reported on 11/12/2023), Disp: , Rfl:    venlafaxine  XR (EFFEXOR -XR) 150 MG 24 hr capsule, Take 1 capsule (150 mg total) by mouth daily with breakfast., Disp: 90 capsule, Rfl: 1   zolpidem  (AMBIEN ) 5 MG tablet, Take 0.5-1 tablets (2.5-5 mg total) by mouth at bedtime as needed for sleep., Disp: 30 tablet, Rfl: 5 Medication Side Effects: none  Family Medical/ Social History: Changes? No  MENTAL HEALTH EXAM:  There were no vitals taken for this visit.There is no height or weight on file to calculate BMI.  General Appearance: Casual and Well Groomed  Eye Contact:  Good  Speech:  Clear and Coherent and Normal Rate  Volume:  Normal  Mood:  Euthymic  Affect:  Congruent  Thought Process:  Goal Directed and Descriptions of Associations: Circumstantial  Orientation:  Full (Time, Place, and Person)  Thought Content: Logical   Suicidal Thoughts:  No  Homicidal Thoughts:  No  Memory:  WNL  Judgement:  Good  Insight:  Good  Psychomotor Activity:  Normal  Concentration:  Concentration: Good  Recall:  Good  Fund of Knowledge: Good  Language: Good  Assets:  Desire for Improvement Financial Resources/Insurance Housing  ADL's:  Intact  Cognition: WNL  Prognosis:  Good   DIAGNOSES:    ICD-10-CM   1. Major depressive disorder, recurrent episode, mild (HCC)  F33.0 buPROPion  (WELLBUTRIN  XL) 150 MG 24 hr tablet    venlafaxine  XR (EFFEXOR -XR) 150 MG 24 hr capsule    2. GAD (generalized  anxiety disorder)  F41.1 venlafaxine  XR (EFFEXOR -XR) 150 MG 24 hr capsule     Receiving Psychotherapy: Yes  with Marval Bunde, [name]  RECOMMENDATIONS:   PDMP reviewed.  Ambien  filled 02/14/2024. I provided approximately 20 minutes of face to face time during this encounter, including time spent before and after the visit in records review, medical decision making, counseling pertinent to today's visit, and charting.   We discussed the memory issues.  Her Mother saw Dr. Chalice. I would recommend her, Dr. Ines, or Dr. Onita, for eval of memory issues and fm hx of dementia. She may need imaging, labs, neuropsych eval.  If she needs referral, let me know.   She is doing well as far as her current medications go so no changes will be made.  Continue Wellbutrin  XL 150 mg, 1 p.o. every morning. Continue Effexor  XR 150 mg, 1 p.o. every morning. Continue Ambien  5 mg, 1/2-1 p.o. nightly as needed sleep. Continue therapy with Marval Bunde, [name]. Return in 6 months.  Verneita Cooks, PA-C

## 2024-02-21 ENCOUNTER — Telehealth: Payer: Self-pay | Admitting: Physician Assistant

## 2024-02-21 NOTE — Telephone Encounter (Signed)
 Pt wants a referral to [facility]. She said both her parents had Alzheimers and she is having some memory problems. Verneita suggested she get tested.

## 2024-02-21 NOTE — Telephone Encounter (Signed)
 Pt called and said that we need to send a referral to [facility]. They need this in order to see her.

## 2024-03-11 ENCOUNTER — Other Ambulatory Visit: Payer: Self-pay | Admitting: Physician Assistant

## 2024-03-11 DIAGNOSIS — F330 Major depressive disorder, recurrent, mild: Secondary | ICD-10-CM

## 2024-03-11 DIAGNOSIS — F411 Generalized anxiety disorder: Secondary | ICD-10-CM

## 2024-03-12 ENCOUNTER — Ambulatory Visit (INDEPENDENT_AMBULATORY_CARE_PROVIDER_SITE_OTHER): Payer: Self-pay | Admitting: Psychiatry

## 2024-03-12 DIAGNOSIS — F411 Generalized anxiety disorder: Secondary | ICD-10-CM

## 2024-03-12 NOTE — Progress Notes (Signed)
     Patient no-showed for appt today.  Fee =  $50.00

## 2024-03-26 ENCOUNTER — Ambulatory Visit (INDEPENDENT_AMBULATORY_CARE_PROVIDER_SITE_OTHER): Payer: Self-pay | Admitting: Psychiatry

## 2024-03-26 DIAGNOSIS — F411 Generalized anxiety disorder: Secondary | ICD-10-CM

## 2024-03-26 NOTE — Progress Notes (Signed)
 [facility] Counselor/Therapist Progress Note  Patient ID: [name] [name], MRN: [id number],    Date: 03/26/2024  Time Spent: 53 minutes   Treatment Type: Individual Therapy  Reported Symptoms: depression, anxiety, some sadness and grief re: dad in Alzheimer's unit   Mental Status Exam:  Appearance:   Casual     Behavior:  Appropriate, Sharing, and Motivated  Motor:  Normal  Speech/Language:   Clear and Coherent  Affect:  Depressed and anxiety  Mood:  anxious  Thought process:  goal directed  Thought content:    WNL  Sensory/Perceptual disturbances:    WNL  Orientation:  oriented to person, place, time/date, situation, day of week, month of year, year, and stated date of Aug. 20, 2025  Attention:  Good  Concentration:  Good  Memory:  Some occasional memory issues in forgetting things  Fund of knowledge:   Good  Insight:    Good  Judgment:   Good  Impulse Control:  Good   Risk Assessment: Danger to Self:  No Self-injurious Behavior: No Danger to Others: No Duty to Warn:no Physical Aggression / Violence:No  Access to Firearms a concern: No  Gang Involvement:No   Subjective:  Patient reporting anxiety, depression, stress, sadness and grief. Reports her crying has decreased. Sleep has been somewhat erratic. Has used her Ambien  some to help her sleep. Dad has fallen 3 times at the facility where he has been living.  Considering Hospice for dad as per her conversation with facility where dad is living. Stressed over situation with her dog needing another home and has made contacts about this in hopes of relocating him to a suitable home. Sleep is good, but does tend to stay up a little later and sleeps a little later. Continues to spend time and enjoys relationship with 2 good friends, but stating I'm really wanting to meet someone. Shared that she has been lonely some lately but did take a chance and went out recently, hoping to meet someone, and did talk to a couple  people there. Needed to share and process some of  her own personal concerns and choices, and wanting to make good decisions going forward. Still enjoys time with her 2 newer friends and still finding that she is adjusting to having placed her dad in a memory care unit and he has had some ups and downs.  Getting out a little more and trying to let go a little more.  Feeling the need for more social contacts and plans to follow through this.  Also noting further progress on her treatment goals which is encouraging to patient.  Interventions: Cognitive Behavioral Therapy, Solution-Oriented/Positive Psychology, and Ego-Supportive 1.Verbally identify the source of depressed mood, and begin talking further about how it became a source of depression and how she is affected by it currently. 2.Ask patient to make a list of what she is depressed about and process it in therapy, including how to confront it and manage it more effectively. 3.Identify cognitive self-talk that is engaged in to support depression and work to make her self-talk less depressing and more supportive and more free of depression.    Diagnosis:   ICD-10-CM   1. GAD (generalized anxiety disorder)  F41.1      Plan: Patient working hard in session today and focusing further on her depression, anxiety, stress, sadness/grief, and frustrations personally and on behalf of her father. ( Not all details included in this note due to patient privacy needs.)  Does  feel good about some of the decisions she has had to make for him and also in her efforts to cope with several life changes more effectively.  Feels that she is recognizing more strength within herself as she continues to work on making better decisions and follow-through on treatment goals.  Mishelle Hassan has made progress and recognizes that she needs to keep working with her goal-directed behaviors so as to continue moving forward in a more hopeful and positive direction into the  future.  Goal review and progress/challenges noted with patient.  Next appointment within 3 weeks.   Barnie Bunde, [name]

## 2024-04-14 ENCOUNTER — Ambulatory Visit (INDEPENDENT_AMBULATORY_CARE_PROVIDER_SITE_OTHER): Payer: Self-pay | Admitting: Psychiatry

## 2024-04-14 DIAGNOSIS — F411 Generalized anxiety disorder: Secondary | ICD-10-CM

## 2024-04-14 NOTE — Progress Notes (Signed)
   Cancellled day of appt.   Late fee charge--$50.00

## 2024-05-06 ENCOUNTER — Ambulatory Visit: Payer: Self-pay | Admitting: Psychiatry

## 2024-06-29 ENCOUNTER — Other Ambulatory Visit: Payer: Self-pay | Admitting: Behavioral Health

## 2024-06-29 DIAGNOSIS — F5105 Insomnia due to other mental disorder: Secondary | ICD-10-CM

## 2024-06-29 DIAGNOSIS — F330 Major depressive disorder, recurrent, mild: Secondary | ICD-10-CM

## 2024-06-29 DIAGNOSIS — F411 Generalized anxiety disorder: Secondary | ICD-10-CM

## 2024-06-29 MED ORDER — VENLAFAXINE HCL ER 150 MG PO CP24: 150.0000 mg | ORAL_CAPSULE | Freq: Every day | ORAL | 0 refills | Status: AC

## 2024-06-29 MED ORDER — ZOLPIDEM TARTRATE 5 MG PO TABS: 2.5000 mg | ORAL_TABLET | Freq: Every evening | ORAL | 1 refills | Status: AC | PRN

## 2024-06-29 MED ORDER — BUPROPION HCL ER (XL) 150 MG PO TB24: 150.0000 mg | ORAL_TABLET | Freq: Every morning | ORAL | 0 refills | Status: DC

## 2024-06-29 NOTE — Progress Notes (Signed)
 Pt called Saturday 11/22 after hours number. Stated she was very depressed and having trouble getting words out. After reaching pt said she ran out of medications a couple days ago. She was leaving to go out of [place] for the holidays and worried about further decline. She wanted to switch her RX to [facility] and  refills were sent in for her to pick up before she left.

## 2024-08-22 ENCOUNTER — Other Ambulatory Visit: Payer: Self-pay | Admitting: Physician Assistant

## 2024-08-22 ENCOUNTER — Ambulatory Visit: Payer: Self-pay | Admitting: Physician Assistant

## 2024-08-22 DIAGNOSIS — F330 Major depressive disorder, recurrent, mild: Secondary | ICD-10-CM

## 2024-08-25 NOTE — Telephone Encounter (Signed)
 Please call to schedule FU, was a no show last week.

## 2024-08-26 ENCOUNTER — Ambulatory Visit: Payer: Self-pay | Admitting: Physician Assistant

## 2024-08-27 NOTE — Telephone Encounter (Signed)
 Sent MyChart message to schedule, missed last 2 appts.

## 2024-08-29 NOTE — Telephone Encounter (Signed)
 LVM @ 3:46p

## 2024-08-29 NOTE — Telephone Encounter (Signed)
 A 90-day RF was sent 06/29/24 so patient should have medication available still.

## 2024-09-04 ENCOUNTER — Other Ambulatory Visit: Payer: Self-pay | Admitting: Physician Assistant

## 2024-09-04 DIAGNOSIS — F330 Major depressive disorder, recurrent, mild: Secondary | ICD-10-CM
